# Patient Record
Sex: Female | Born: 1935 | Race: White | Hispanic: No | Smoking: Never smoker
Health system: Southern US, Community
[De-identification: ages and names within clinical notes are randomized; demographics above are authoritative.]

## PROBLEM LIST (undated history)

## (undated) DIAGNOSIS — F411 Generalized anxiety disorder: Secondary | ICD-10-CM

## (undated) DIAGNOSIS — I351 Nonrheumatic aortic (valve) insufficiency: Secondary | ICD-10-CM

## (undated) DIAGNOSIS — E871 Hypo-osmolality and hyponatremia: Secondary | ICD-10-CM

## (undated) DIAGNOSIS — E785 Hyperlipidemia, unspecified: Secondary | ICD-10-CM

## (undated) DIAGNOSIS — G459 Transient cerebral ischemic attack, unspecified: Secondary | ICD-10-CM

## (undated) DIAGNOSIS — D333 Benign neoplasm of cranial nerves: Secondary | ICD-10-CM

## (undated) DIAGNOSIS — R519 Headache, unspecified: Secondary | ICD-10-CM

## (undated) DIAGNOSIS — I6529 Occlusion and stenosis of unspecified carotid artery: Secondary | ICD-10-CM

## (undated) DIAGNOSIS — R0600 Dyspnea, unspecified: Secondary | ICD-10-CM

## (undated) DIAGNOSIS — I350 Nonrheumatic aortic (valve) stenosis: Secondary | ICD-10-CM

## (undated) DIAGNOSIS — I10 Essential (primary) hypertension: Secondary | ICD-10-CM

## (undated) DIAGNOSIS — R51 Headache: Secondary | ICD-10-CM

## (undated) HISTORY — DX: Occlusion and stenosis of unspecified carotid artery: I65.29

## (undated) HISTORY — DX: Benign neoplasm of cranial nerves: D33.3

## (undated) HISTORY — DX: Headache, unspecified: R51.9

## (undated) HISTORY — DX: Generalized anxiety disorder: F41.1

## (undated) HISTORY — DX: Hypo-osmolality and hyponatremia: E87.1

## (undated) HISTORY — PX: CHOLECYSTECTOMY: SHX55

## (undated) HISTORY — DX: Hyperlipidemia, unspecified: E78.5

## (undated) HISTORY — DX: Headache: R51

## (undated) HISTORY — DX: Transient cerebral ischemic attack, unspecified: G45.9

## (undated) HISTORY — PX: CATARACT EXTRACTION: SUR2

## (undated) HISTORY — DX: Nonrheumatic aortic (valve) stenosis: I35.0

## (undated) HISTORY — DX: Nonrheumatic aortic (valve) insufficiency: I35.1

---

## 1999-11-24 ENCOUNTER — Other Ambulatory Visit: Admission: RE | Admit: 1999-11-24 | Discharge: 1999-11-24 | Payer: Self-pay | Admitting: *Deleted

## 2004-09-30 ENCOUNTER — Ambulatory Visit: Payer: Self-pay | Admitting: Cardiology

## 2005-06-26 HISTORY — PX: WRIST SURGERY: SHX841

## 2006-09-18 ENCOUNTER — Other Ambulatory Visit: Admission: RE | Admit: 2006-09-18 | Discharge: 2006-09-18 | Payer: Self-pay | Admitting: Obstetrics and Gynecology

## 2008-01-20 ENCOUNTER — Encounter: Payer: Self-pay | Admitting: Cardiology

## 2008-05-01 ENCOUNTER — Ambulatory Visit: Payer: Self-pay | Admitting: Obstetrics and Gynecology

## 2008-05-06 ENCOUNTER — Ambulatory Visit: Payer: Self-pay | Admitting: Obstetrics and Gynecology

## 2008-05-14 ENCOUNTER — Encounter: Payer: Self-pay | Admitting: Cardiology

## 2008-06-12 ENCOUNTER — Encounter: Payer: Self-pay | Admitting: Cardiology

## 2008-06-25 ENCOUNTER — Ambulatory Visit: Payer: Self-pay | Admitting: Cardiology

## 2008-07-29 ENCOUNTER — Ambulatory Visit: Payer: Self-pay | Admitting: Cardiology

## 2008-08-20 ENCOUNTER — Ambulatory Visit: Payer: Self-pay | Admitting: Obstetrics and Gynecology

## 2009-01-14 DIAGNOSIS — I350 Nonrheumatic aortic (valve) stenosis: Secondary | ICD-10-CM

## 2009-01-14 DIAGNOSIS — R9389 Abnormal findings on diagnostic imaging of other specified body structures: Secondary | ICD-10-CM

## 2009-04-06 ENCOUNTER — Ambulatory Visit: Payer: Self-pay | Admitting: Obstetrics and Gynecology

## 2009-10-06 ENCOUNTER — Ambulatory Visit: Payer: Self-pay | Admitting: Obstetrics and Gynecology

## 2010-07-17 ENCOUNTER — Encounter: Payer: Self-pay | Admitting: Obstetrics and Gynecology

## 2010-11-08 NOTE — Assessment & Plan Note (Signed)
[facility]                          [facility] OFFICE NOTE   NAME:Gates, Kathryn RAVAL                      MRN:          [id number]  DATE:08/24/2008                            DOB:          31-May-1930    I saw Kathryn Gates in consultation back in early February.  She was referred  at that time given an abnormal echocardiogram demonstrating nodular  thickening of the left and noncoronary aortic cusps with restricted  motion of the left coronary cusp and moderate aortic regurgitation.  She  had had some visual changes earlier in December without definitive  diagnosis.  No clear mobile areas were noted in association with her  aortic valve and symptomatically she was stable on antiplatelet therapy.  We talked about the situation and discussed proceeding with a  transesophageal echocardiogram to better define her aortic valve  structure and function, and I also referred her for an erythrocyte  sedimentation rate and blood cultures to exclude the unlikely  possibility of endocarditis.  Her erythrocyte sedimentation rate was  entirely normal at 11 and her blood cultures were also normal.   Kathryn Gates was scheduled for her transesophageal echocardiogram on  August 18, 2008, and presented for this, although we unfortunately had  to reschedule the procedure given inpatient consult volume and staffing.  The office contacted Kathryn Gates about this and at this point, she prefers  to hold off rescheduling her transesophageal echocardiogram, and for  that matter office followup reporting that she was not having any new  symptoms.   At this point, Kathryn Gates should therefore continue to follow up with Dr.  Gerhard Munch and we can certainly proceed with further evaluation if Kathryn Gates  agrees.  Would at least consider followup surface echocardiogram in 1  year's time for reevaluation of the patient's aortic valve.     Jonelle Sidle, MD  Electronically Signed    SGM/MedQ  DD: 08/24/2008  DT: 08/25/2008  Job #: [id number]   cc:   Linward Foster

## 2010-11-08 NOTE — Assessment & Plan Note (Signed)
[facility]                          [facility]ICE NOTE   NAME:Kathryn Gates, Kathryn Gates                      MRN:          [id number]  DATE:07/29/2008                            DOB:          February 10, 1930    REFERRING PHYSICIAN:  Linward Foster   REASON FOR CONSULTATION:  Abnormal echocardiogram.   HISTORY OF PRESENT ILLNESS:  Kathryn Gates is a 77 year old woman with a  history of hyperlipidemia and gastroesophageal reflux disease.  She had  an episode of visual change back in December, prompting an  echocardiogram and carotid Dopplers as well as a head CT scan.  She  states that she was driving her car in her driveway and felt that her  vision suddenly frosted up.  This lasted for approximately 5 minutes  and was not associated with any other memory problems, speech problems,  or focal weakness.  She reports that she has had no recurrence of this.  Head CT scan did not reveal any obvious stroke.  Her carotid Dopplers  were also reassuring demonstrating no obstructive plaque.  Her  echocardiogram revealed normal left ventricular systolic function at 60-  65% with diastolic dysfunction, but no regional wall motion  abnormalities.  There was mild mitral regurgitation and trace tricuspid  regurgitation.  The aortic valve was abnormal with moderate nodular  thickening of the noncoronary cusps and moderate thickening of the left  coronary cusp with reduced leaflet excursion.  She did not have any  substantial degree of aortic stenosis with a mean gradient of 9 mmHg.  She did have moderate aortic regurgitation.  Ms Kathryn Gates denies having any  obvious fevers or chills.  She has had a general fatigue at times.  No  rashes.  Her electrocardiogram shows sinus rhythm with normal intervals  and nonspecific ST changes.  Today, I reviewed her echocardiographic  results and we talked about further testing.   ALLERGIES:  No known drug allergies.   MEDICATIONS:  1.  Pravastatin 20 mg p.o. daily.  2. Aciphex 20 mg p.o. daily.  3. Aspirin 81 mg p.o. daily.  4. Multivitamin 1 p.o. daily.  5. Xanax 0.25 mg p.o. p.r.n.   PAST MEDICAL HISTORY:  As outlined above.  She is status post cesarean  section in 1973.  She had right wrist surgery in 2007.  She reports  having some type of ovarian mass and will likely require an elective  surgery for this.  She states that this was not felt to be malignant.  She has no clear history of cardiovascular disease.   SOCIAL HISTORY:  The patient is a widow.  She has 1 child.  Her sister  was present with her today.  She drinks 3 cups of coffee a day.  Does  not do any regular exercise at this time.  She has no active tobacco or  alcohol use history.   FAMILY HISTORY:  Reviewed.  The patient's mother died at age 60 with  congestive heart failure.  The patient's father died at age 25 with a  stroke.  She has 1 sister died at age 42  due to complications of a fall.   REVIEW OF SYSTEMS:  As outlined above.  She does have occasional reflux,  although no dysphagia or odynophagia.  Otherwise is negative.   PHYSICAL EXAMINATION:  VITAL SIGNS:  Blood pressure 157/70, heart rate  is 75, and weight is 124 pounds.  GENERAL:  The patient is normally nourished appearing and in no acute  distress.  HEENT:  Conjunctiva is normal.  Oropharynx is clear.  NECK:  Supple.  No elevated jugular venous pressure.  No loud bruits.  No thyromegaly is noted.  LUNGS:  Clear with labored breathing at rest.  CARDIAC:  Regular rate and rhythm with a soft systolic murmur as well as  a soft diastolic murmur heard early after S2 best at the left sternal  border.  No pericardial rub or S3 gallop.  ABDOMEN:  Soft and nontender.  Normoactive bowel sounds.  EXTREMITIES:  No significant pitting edema.  Distal pulses are 2+.  SKIN:  Warm and dry.  No rashes.  No erythema.  MUSCULOSKELETAL:  No kyphosis noted.  NEUROPSYCHIATRIC:  The patient is alert  and oriented x3.  Affect is  appropriate.   IMPRESSION AND RECOMMENDATIONS:  Abnormal aortic valve based on recent  echocardiogram with nodular thickening particularly of the left and  noncoronary cusps, restricted motion of the left coronary cusps, and  moderate aortic regurgitation.  It is not clear that this was related to  her episode of visual change back in December.  No clear mobile areas in  association with aortic valve are noted on her surface study.  This may  well be a chronic degenerative process.  I agree with antiplatelet  therapy and would also recommend a followup transesophageal  echocardiogram to better assess aortic valvular structure and function,  excluding any thrombus or other sorce of emboli.  Although, I doubt  endocarditis is of major concern,  will obtain an erythrocyte  sedimentation rate and 2 sets of blood cultures to better resolve this  issue.  I will plan to have her come back to the office to review these  results and we can follow her aortic valve over time.     Jonelle Sidle, MD  Electronically Signed    SGM/MedQ  DD: 07/29/2008  DT: 07/29/2008  Job #: [id number]   cc:   Linward Foster

## 2010-12-22 ENCOUNTER — Encounter: Payer: Self-pay | Admitting: Cardiology

## 2011-08-24 ENCOUNTER — Encounter: Payer: Self-pay | Admitting: Vascular Surgery

## 2011-09-18 DIAGNOSIS — J069 Acute upper respiratory infection, unspecified: Secondary | ICD-10-CM | POA: Insufficient documentation

## 2011-09-18 DIAGNOSIS — J029 Acute pharyngitis, unspecified: Secondary | ICD-10-CM | POA: Insufficient documentation

## 2012-01-11 DIAGNOSIS — M23329 Other meniscus derangements, posterior horn of medial meniscus, unspecified knee: Secondary | ICD-10-CM | POA: Insufficient documentation

## 2012-04-04 DIAGNOSIS — R42 Dizziness and giddiness: Secondary | ICD-10-CM | POA: Insufficient documentation

## 2012-10-15 DIAGNOSIS — Z0000 Encounter for general adult medical examination without abnormal findings: Secondary | ICD-10-CM | POA: Insufficient documentation

## 2012-11-19 DIAGNOSIS — G4700 Insomnia, unspecified: Secondary | ICD-10-CM | POA: Insufficient documentation

## 2012-12-11 ENCOUNTER — Encounter (HOSPITAL_COMMUNITY): Payer: Self-pay | Admitting: *Deleted

## 2012-12-11 ENCOUNTER — Observation Stay (HOSPITAL_COMMUNITY)
Admission: EM | Admit: 2012-12-11 | Discharge: 2012-12-13 | Disposition: A | Discharge status: Home / Self Care | Payer: Medicare Other | Attending: Internal Medicine | Admitting: Internal Medicine

## 2012-12-11 DIAGNOSIS — D333 Benign neoplasm of cranial nerves: Secondary | ICD-10-CM

## 2012-12-11 DIAGNOSIS — R9389 Abnormal findings on diagnostic imaging of other specified body structures: Secondary | ICD-10-CM

## 2012-12-11 DIAGNOSIS — I10 Essential (primary) hypertension: Secondary | ICD-10-CM | POA: Diagnosis present

## 2012-12-11 DIAGNOSIS — E875 Hyperkalemia: Secondary | ICD-10-CM | POA: Diagnosis present

## 2012-12-11 DIAGNOSIS — G459 Transient cerebral ischemic attack, unspecified: Principal | ICD-10-CM | POA: Diagnosis present

## 2012-12-11 DIAGNOSIS — I359 Nonrheumatic aortic valve disorder, unspecified: Secondary | ICD-10-CM

## 2012-12-11 DIAGNOSIS — E871 Hypo-osmolality and hyponatremia: Secondary | ICD-10-CM | POA: Diagnosis present

## 2012-12-11 DIAGNOSIS — R209 Unspecified disturbances of skin sensation: Secondary | ICD-10-CM | POA: Insufficient documentation

## 2012-12-11 DIAGNOSIS — R4789 Other speech disturbances: Secondary | ICD-10-CM | POA: Insufficient documentation

## 2012-12-11 DIAGNOSIS — R42 Dizziness and giddiness: Secondary | ICD-10-CM | POA: Insufficient documentation

## 2012-12-11 DIAGNOSIS — Z79899 Other long term (current) drug therapy: Secondary | ICD-10-CM | POA: Insufficient documentation

## 2012-12-11 HISTORY — DX: Essential (primary) hypertension: I10

## 2012-12-11 LAB — URINALYSIS, ROUTINE W REFLEX MICROSCOPIC
Ketones, ur: NEGATIVE mg/dL
Nitrite: NEGATIVE
Specific Gravity, Urine: 1.012 (ref 1.005–1.030)
Urobilinogen, UA: 0.2 mg/dL (ref 0.0–1.0)
pH: 7 (ref 5.0–8.0)

## 2012-12-11 LAB — COMPREHENSIVE METABOLIC PANEL
Alkaline Phosphatase: 80 U/L (ref 39–117)
BUN: 14 mg/dL (ref 6–23)
CO2: 27 mEq/L (ref 19–32)
Calcium: 10.5 mg/dL (ref 8.4–10.5)
GFR calc Af Amer: 89 mL/min — ABNORMAL LOW (ref >90)
GFR calc non Af Amer: 77 mL/min — ABNORMAL LOW (ref >90)
Glucose, Bld: 131 mg/dL — ABNORMAL HIGH (ref 70–99)
Total Protein: 7.3 g/dL (ref 6.0–8.3)

## 2012-12-11 LAB — CBC
MCHC: 35.3 g/dL (ref 30.0–36.0)
MCV: 85 fL (ref 78.0–100.0)
RBC: 4.07 MIL/uL (ref 3.87–5.11)
WBC: 7.2 10*3/uL (ref 4.0–10.5)

## 2012-12-11 LAB — URINE MICROSCOPIC-ADD ON

## 2012-12-11 NOTE — ED Notes (Signed)
The pt has had tingling in her head for 3 weeks.  Since yesterday she has had weakness in her arms and legs.  3weeks ago she was admitted  And worked up for a stroke dx with mild tia.  She became concerned today and she came here because her appointment with neuro is not for one month

## 2012-12-11 NOTE — ED Notes (Signed)
Pt ambulatory to br

## 2012-12-12 ENCOUNTER — Observation Stay (HOSPITAL_COMMUNITY): Payer: Medicare Other

## 2012-12-12 ENCOUNTER — Encounter (HOSPITAL_COMMUNITY): Payer: Self-pay | Admitting: Internal Medicine

## 2012-12-12 DIAGNOSIS — I359 Nonrheumatic aortic valve disorder, unspecified: Secondary | ICD-10-CM

## 2012-12-12 DIAGNOSIS — I10 Essential (primary) hypertension: Secondary | ICD-10-CM | POA: Diagnosis present

## 2012-12-12 DIAGNOSIS — G459 Transient cerebral ischemic attack, unspecified: Secondary | ICD-10-CM | POA: Diagnosis present

## 2012-12-12 DIAGNOSIS — E875 Hyperkalemia: Secondary | ICD-10-CM | POA: Diagnosis present

## 2012-12-12 DIAGNOSIS — E871 Hypo-osmolality and hyponatremia: Secondary | ICD-10-CM | POA: Diagnosis present

## 2012-12-12 LAB — BASIC METABOLIC PANEL
Chloride: 95 mEq/L — ABNORMAL LOW (ref 96–112)
Creatinine, Ser: 0.71 mg/dL (ref 0.50–1.10)
GFR calc Af Amer: >90 mL/min (ref >90)
Potassium: 3.4 mEq/L — ABNORMAL LOW (ref 3.5–5.1)
Sodium: 128 mEq/L — ABNORMAL LOW (ref 135–145)

## 2012-12-12 LAB — OSMOLALITY, URINE: Osmolality, Ur: 265 mOsm/kg — ABNORMAL LOW (ref 390–1090)

## 2012-12-12 LAB — LIPID PANEL
Cholesterol: 230 mg/dL — ABNORMAL HIGH (ref 0–200)
HDL: 70 mg/dL (ref >39)
Total CHOL/HDL Ratio: 3.3 RATIO
Triglycerides: 65 mg/dL (ref <150)

## 2012-12-12 LAB — GLUCOSE, CAPILLARY
Glucose-Capillary: 96 mg/dL (ref 70–99)
Glucose-Capillary: 129 mg/dL — ABNORMAL HIGH (ref 70–99)

## 2012-12-12 LAB — CREATININE, URINE, RANDOM: Creatinine, Urine: 52.43 mg/dL

## 2012-12-12 MED ORDER — SIMVASTATIN 10 MG PO TABS
10.0000 mg | ORAL_TABLET | Freq: Every day | ORAL | Status: DC
  Administered 2012-12-12: 10 mg via ORAL
  Filled 2012-12-12 (×2): qty 1

## 2012-12-12 MED ORDER — ACETAMINOPHEN 325 MG PO TABS: 650.0000 mg | ORAL_TABLET | ORAL | Status: DC | PRN

## 2012-12-12 MED ORDER — ASPIRIN 325 MG PO TABS
325.0000 mg | ORAL_TABLET | Freq: Every day | ORAL | Status: DC
  Administered 2012-12-12 – 2012-12-13 (×2): 325 mg via ORAL
  Filled 2012-12-12 (×2): qty 1

## 2012-12-12 MED ORDER — PANTOPRAZOLE SODIUM 40 MG PO TBEC
40.0000 mg | DELAYED_RELEASE_TABLET | Freq: Every day | ORAL | Status: DC
  Administered 2012-12-12 – 2012-12-13 (×2): 40 mg via ORAL
  Filled 2012-12-12 (×2): qty 1

## 2012-12-12 MED ORDER — DEXTROSE 5 % IV SOLN
1.0000 g | INTRAVENOUS | Status: DC
  Administered 2012-12-12 – 2012-12-13 (×2): 1 g via INTRAVENOUS
  Filled 2012-12-12 (×2): qty 10

## 2012-12-12 MED ORDER — ALPRAZOLAM 0.25 MG PO TABS: 0.2500 mg | ORAL_TABLET | Freq: Two times a day (BID) | ORAL | Status: DC | PRN

## 2012-12-12 NOTE — ED Notes (Signed)
Pt ambulatory to br

## 2012-12-12 NOTE — ED Provider Notes (Signed)
History     CSN: [id number]  Arrival date & time 12/11/12  2049   First MD Initiated Contact with Patient 12/11/12 2300      Chief Complaint  Patient presents with  . tingling in her head    HPI Kathryn Gates is a 79 y.o. female recent tingling in her head for 3 weeks. She says it starts without rhyme or reason, associated with dizziness. This occurred earlier today will she was sitting down talking with her son on the phone, she became dizzy and had tingling in his skull cap distribution over her head. She says her scalp is "tingly." She also says that her arms and legs are unusually weak but cannot identify any portion of the arm is specifically week. She has no weakness when combing her head or reaching for overhead objects. She has no weakness in getting out of a chair. She says she has progressive weakness over the course of the day. The sensation of tingling is not one side of the head or the other.  3 weeks ago when she first had the symptoms she was seen at [facility], she was admitted for a TIA workup including an MRI and carotid ultrasounds. She saw her family doctor Tuesday and she is being referred to a neurologist. She had episodes of dizziness on Monday and Wednesday of this week, and her son feels like she had slurred speech today at 5:00 this evening.  Denies any chest pain, shortness of breath, abdominal pain, nausea, vomiting, diarrhea, disequilibrium, falling, syncope, rash, myalgias, arthralgias. Past Medical History  Diagnosis Date  . Aortic regurgitation     Moderate  . Abnormal echocardiogram   . Hypertension     Past Surgical History  Procedure Laterality Date  . Cesarean section  1973  . Wrist surgery  2007    Right    Family History  Problem Relation Age of Onset  . Stroke Other   . Heart failure Other     CHF    History  Substance Use Topics  . Smoking status: Unknown If Ever Smoked  . Smokeless tobacco: Not on file     Comment: Tobacco  use-no  . Alcohol Use: No    OB History   Grav Para Term Preterm Abortions TAB SAB Ect Mult Living                  Review of Systems At least 10pt or greater review of systems completed and are negative except where specified in the HPI.  Allergies  Review of patient's allergies indicates no known allergies.  Home Medications   Current Outpatient Rx  Name  Route  Sig  Dispense  Refill  . ALPRAZolam (XANAX) 0.25 MG tablet   Oral   Take 0.25 mg by mouth 2 (two) times daily as needed for anxiety.         Marland Kitchen aspirin EC 81 MG tablet   Oral   Take 81 mg by mouth daily.         . metoprolol succinate (TOPROL-XL) 25 MG 24 hr tablet   Oral   Take 25 mg by mouth daily.         . pravastatin (PRAVACHOL) 20 MG tablet   Oral   Take 20 mg by mouth at bedtime.         . RABEprazole (ACIPHEX) 20 MG tablet   Oral   Take 20 mg by mouth daily.         Marland Kitchen  valsartan (DIOVAN) 160 MG tablet   Oral   Take 160 mg by mouth daily.           BP 153/49  Pulse 53  Temp(Src) 97.3 F (36.3 C) (Oral)  Resp 16  SpO2 100%  Physical Exam  PHYSICAL EXAM: VITAL SIGNS:  . Filed Vitals:   12/11/12 2107 12/11/12 2224 12/11/12 2315  BP: 136/43 153/49 153/49  Pulse: 60 52 53  Temp: 97.6 F (36.4 C) 97.3 F (36.3 C)   TempSrc: Oral Oral   Resp: 16  16  SpO2: 98% 100% 100%   CONSTITUTIONAL: Awake, oriented, appears non-toxic HENT: Atraumatic, normocephalic, oral mucosa pink and moist, airway patent. Nares patent without drainage. External ears normal. EYES: Conjunctiva clear, EOMI, PERRLA NECK: Trachea midline, non-tender, supple CARDIOVASCULAR: Normal heart rate, Normal rhythm, No murmurs, rubs, gallops PULMONARY/CHEST: Clear to auscultation, no rhonchi, wheezes, or rales. Symmetrical breath sounds. CHEST WALL: No lesions. Non-tender. ABDOMINAL: Non-distended, soft, non-tender - no rebound or guarding.  BS normal. NEUROLOGIC: GU:YQIHKV fields intact. PERRLA, EOMI.  Facial  sensation equal to light touch bilaterally.  Good muscle bulk in the masseter muscle and good lateral movement of the jaw.  Facial expressions equal and good strength with smile/frown and puffed cheeks.  Hearing grossly intact to finger rub test.  Uvula, tongue are midline with no deviation. Symmetrical palate elevation.  Trapezius and SCM muscles are 5/5 strength bilaterally.   DTR: Brachioradialis, biceps, patellar, Achilles tendon reflexes 2+ bilaterally.  No clonus. Strength: 5/5 strength flexors and extensors in the upper and lower extremities.  Grip strength, finger adduction/abduction 5/5. Sensation: Sensation intact distally to light touch Cerebellar: No ataxia with walking or dysmetria with finger to nose EXTREMITIES: No clubbing, cyanosis, or edema SKIN: Warm, Dry, No erythema, No rash   ED Course  Procedures (including critical care time)  Date: 12/12/2012  Rate: 54  Rhythm: Sinus bradycardia  QRS Axis: normal  Intervals: normal  ST/T Wave abnormalities: normal  Conduction Disutrbances: none  Narrative Interpretation: Unremarkable, sinus bradycardia, asymptomatic, no ST or T wave abnormalities suggestive of acute ischemia or infarction  Labs Reviewed  CBC - Abnormal; Notable for the following:    HCT 34.6 (*)    All other components within normal limits  COMPREHENSIVE METABOLIC PANEL - Abnormal; Notable for the following:    Sodium 131 (*)    Potassium 5.5 (*)    Glucose, Bld 131 (*)    GFR calc non Af Amer 77 (*)    GFR calc Af Amer 89 (*)    All other components within normal limits  URINALYSIS, ROUTINE W REFLEX MICROSCOPIC - Abnormal; Notable for the following:    Leukocytes, UA MODERATE (*)    All other components within normal limits  GLUCOSE, CAPILLARY - Abnormal; Notable for the following:    Glucose-Capillary 126 (*)    All other components within normal limits  URINE MICROSCOPIC-ADD ON - Abnormal; Notable for the following:    Casts HYALINE CASTS (*)    All  other components within normal limits   No results found.   1. TIA (transient ischemic attack)   2. Hyperkalemia   3. Hypertension   4. Hyponatremia       MDM  Patient arrives to the symptoms of tingling scalp, possible dysarthria after having been worked up at [place], patient is currently taking a daily aspirin. When asked about Coumadin, her physicians thought she was not a good candidate for Coumadin therapy.  Have sent for records  from [facility].  Comparing records from [facility], patient is had chronic, mild hyponatremia, and has had ultrasounds of her carotids however has not had MRI of her brain. Discussed with Dr. Adela Glimpse for admission for TIA workup. Patient needs to be further risk stratified, for possible antiplatelet medications or anticoagulation.        Jones Skene, MD 12/12/12 [phone]

## 2012-12-12 NOTE — Progress Notes (Signed)
Patient admitted for TIA workup. Await completion of workup. Will continue to follow.  Peggye Pitt, MD [facility] Pager: [phone]

## 2012-12-12 NOTE — ED Notes (Signed)
Report given to floor

## 2012-12-12 NOTE — Progress Notes (Signed)
PT Cancellation Note  Patient Details Name: Kathryn Gates MRN: [id number] DOB: Aug 21, 1929   Cancelled Treatment:    Reason Eval/Treat Not Completed: Patient at procedure or test/unavailable. Will f/u tomorrow.    [facility] HELEN 12/12/2012, 3:02 PM Pager: [phone]

## 2012-12-12 NOTE — Progress Notes (Signed)
Echo Lab  2D Echocardiogram completed.  Kathryn Gates, RDCS 12/12/2012 3:57 PM

## 2012-12-12 NOTE — H&P (Signed)
PCP: [facility]: Alanda Amass  Chief Complaint:  slurred speech  HPI: Kathryn Gates is a 79 y.o. female   has a past medical history of Aortic regurgitation; Abnormal echocardiogram; and Hypertension.   Presented with  In AM she started to have tingling of her scalp and slurred speech that episode eased off during the day and went away by the time she presented to ER. She have had similar episodes in the recent past and 4 weeks ago was hospitalized at [facility] she have had a carotid doppler done that showed RICA 50% and LICA <50%. CXR was unremakable with negative CT of the head. No MRI was done at that time. Her Sodium was noted to be slightly low at 130. With K of 4.5 .  Currently her symptoms have resolved. Denis any chest pain or SOB, no fever, chills. Denies any localized weakness.  Hospitlatist called for admission.   Review of Systems:    Pertinent positives include: slurred speech, scalp tingling  Constitutional:  No weight loss, night sweats, Fevers, chills, fatigue, weight loss  HEENT:  No headaches, Difficulty swallowing,Tooth/dental problems,Sore throat,  No sneezing, itching, ear ache, nasal congestion, post nasal drip,  Cardio-vascular:  No chest pain, Orthopnea, PND, anasarca, dizziness, palpitations.no Bilateral lower extremity swelling  GI:  No heartburn, indigestion, abdominal pain, nausea, vomiting, diarrhea, change in bowel habits, loss of appetite, melena, blood in stool, hematemesis Resp:  no shortness of breath at rest. No dyspnea on exertion, No excess mucus, no productive cough, No non-productive cough, No coughing up of blood.No change in color of mucus.No wheezing. Skin:  no rash or lesions. No jaundice GU:  no dysuria, change in color of urine, no urgency or frequency. No straining to urinate.  No flank pain.  Musculoskeletal:  No joint pain or no joint swelling. No decreased range of motion. No back pain.  Psych:  No change in  mood or affect. No depression or anxiety. No memory loss.  Neuro: no localizing neurological complaints, no tingling, no weakness, no double vision, no gait abnormality, no  no confusion  Otherwise ROS are negative except for above, 10 systems were reviewed  Past Medical History: Past Medical History  Diagnosis Date  . Aortic regurgitation     Moderate  . Abnormal echocardiogram   . Hypertension    Past Surgical History  Procedure Laterality Date  . Cesarean section  1973  . Wrist surgery  2007    Right     Medications: Prior to Admission medications   Medication Sig Start Date End Date Taking? Authorizing Provider  ALPRAZolam (XANAX) 0.25 MG tablet Take 0.25 mg by mouth 2 (two) times daily as needed for anxiety.   Yes Historical Provider, MD  aspirin EC 81 MG tablet Take 81 mg by mouth daily.   Yes Historical Provider, MD  metoprolol succinate (TOPROL-XL) 25 MG 24 hr tablet Take 12.5 mg by mouth daily.    Yes Historical Provider, MD  pravastatin (PRAVACHOL) 20 MG tablet Take 20 mg by mouth at bedtime.   Yes Historical Provider, MD  RABEprazole (ACIPHEX) 20 MG tablet Take 20 mg by mouth daily.   Yes Historical Provider, MD  valsartan (DIOVAN) 160 MG tablet Take 160 mg by mouth daily.   Yes Historical Provider, MD    Allergies:  No Known Allergies  Social History:  Ambulatory with cane Lives at  Home with family   reports that she has never smoked. She does not have any smokeless tobacco  history on file. She reports that she does not drink alcohol or use illicit drugs.   Family History: family history includes Heart failure in her other and Stroke in her other.    Physical Exam: Patient Vitals for the past 24 hrs:  BP Temp Temp src Pulse Resp SpO2  12/12/12 0415 136/54 mmHg - - 65 19 99 %  12/12/12 0400 118/50 mmHg - - 53 13 98 %  12/12/12 0345 138/50 mmHg - - 58 17 99 %  12/12/12 0330 104/45 mmHg - - 48 12 98 %  12/12/12 0315 118/47 mmHg - - 49 12 99 %  12/12/12  0300 128/46 mmHg - - 49 17 98 %  12/12/12 0245 123/47 mmHg - - 53 17 98 %  12/12/12 0230 128/48 mmHg - - 54 19 99 %  12/12/12 0215 117/60 mmHg - - 55 15 99 %  12/12/12 0200 86/69 mmHg - - 53 16 99 %  12/12/12 0145 64/42 mmHg - - 51 15 100 %  12/12/12 0130 146/51 mmHg - - 63 22 100 %  12/12/12 0115 135/42 mmHg - - 46 10 98 %  12/12/12 0100 126/41 mmHg - - 45 10 98 %  12/12/12 0045 134/42 mmHg - - 50 14 97 %  12/12/12 0030 145/48 mmHg - - 46 11 98 %  12/11/12 2315 153/49 mmHg - - 53 16 100 %  12/11/12 2224 153/49 mmHg 97.3 F (36.3 C) Oral 52 - 100 %  12/11/12 2107 136/43 mmHg 97.6 F (36.4 C) Oral 60 16 98 %    1. General:  in No Acute distress 2. Psychological: Alert and Oriented 3. Head/ENT:   Moist  Mucous Membranes                          Head Non traumatic, neck supple                          NormalDentition 4. SKIN: normal Skin turgor,  Skin clean Dry and intact no rash 5. Heart: Regular rate and rhythm Aortic Murmur noted , no Rub or gallop 6. Lungs: Clear to auscultation bilaterally, no wheezes or crackles   7. Abdomen: Soft, non-tender, Non distended 8. Lower extremities: no clubbing, cyanosis, or edema 9. Neurologically CN 2-12 intact, strength 5/5 in all 4 ext. No pronator drift, normal speech 10. MSK: Normal range of motion  body mass index is unknown because there is no height or weight on file.   Labs on Admission:   Recent Labs  12/11/12 2100  NA 131*  K 5.5*  CL 96  CO2 27  GLUCOSE 131*  BUN 14  CREATININE 0.78  CALCIUM 10.5    Recent Labs  12/11/12 2100  AST 17  ALT 11  ALKPHOS 80  BILITOT 0.6  PROT 7.3  ALBUMIN 4.2   No results found for this basename: LIPASE, AMYLASE,  in the last 72 hours  Recent Labs  12/11/12 2100  WBC 7.2  HGB 12.2  HCT 34.6*  MCV 85.0  PLT 279   No results found for this basename: CKTOTAL, CKMB, CKMBINDEX, TROPONINI,  in the last 72 hours No results found for this basename: TSH, T4TOTAL, FREET3, T3FREE,  THYROIDAB,  in the last 72 hours No results found for this basename: VITAMINB12, FOLATE, FERRITIN, TIBC, IRON, RETICCTPCT,  in the last 72 hours No results found for this basename: HGBA1C  CrCl is unknown because there is no height on file for the current visit. ABG No results found for this basename: phart, pco2, po2, hco3, tco2, acidbasedef, o2sat     No results found for this basename: DDIMER     Other results:  I have pearsonaly reviewed this: ECG REPORT  Rate: 54  Rhythm: sinus bradycardia ST&T Change: no ischemic changes  UA mild UTI   Cultures: No results found for this basename: sdes, specrequest, cult, reptstatus       Radiological Exams on Admission: No results found.  Chart has been reviewed  Assessment/Plan  79 yo F with hxo f HTN and aortic regurgitation here with atypical TIA like  Symptoms, mild hyponatremia and hyperkalemia  Present on Admission:  . TIA (transient ischemic attack) -  - will admit based on TIA/CVA protocol, await results of MRA/MRI  and Echo, obtain cardiac enzymes,  ECG,   Lipid panel, TSH. Order PT/OT evaluation. Will make sure patient is on antiplatelet agent.   Neurology consult if abnormal MRI.     Marland Kitchen Hypertension - hold toprol given bradycardia and low BP, hold for now ARB given hyperkalemia . Hyperkalemia - hold diovan and repeat . Hyponatremia - mild, possibly chronic will check urine electrolytes she may benefit from fluid restriction   Prophylaxis: SCD   CODE STATUS: FULL CODE   Other plan as per orders.  I have spent a total of  55 min on this admission  Lovenia Debruler 12/12/2012, 4:48 AM

## 2012-12-13 DIAGNOSIS — D333 Benign neoplasm of cranial nerves: Secondary | ICD-10-CM

## 2012-12-13 LAB — LIPID PANEL
Cholesterol: 237 mg/dL — ABNORMAL HIGH (ref 0–200)
HDL: 73 mg/dL (ref >39)
LDL Cholesterol: 152 mg/dL — ABNORMAL HIGH (ref 0–99)
Triglycerides: 62 mg/dL (ref <150)

## 2012-12-13 LAB — GLUCOSE, CAPILLARY
Glucose-Capillary: 79 mg/dL (ref 70–99)
Glucose-Capillary: 80 mg/dL (ref 70–99)

## 2012-12-13 NOTE — Evaluation (Signed)
Physical Therapy Evaluation Patient Details Name: Kathryn Gates MRN: [id number] DOB: 07-Apr-1930 Today's Date: 12/13/2012 Time: 4098-1191 PT Time Calculation (min): 19 min  PT Assessment / Plan / Recommendation Clinical Impression  79 y/o female adm. for slurred speech and imbalance in the past few weeks. MRI (-) for acute infarct however suggestive of acoustic schwannoma. Per MD this may be what is causing her imbalance. She presents to PT today with impaired balance and confidence with mobility making her at a higher risk for falls. We trialed her on RW and [facility] and she is much safer and more confident with RW. Patient initially hesitant to using RW however with education and practice she seems open to it at least short term. Recommending HHPT for full balance assessment as well a RW for d/c.     PT Assessment  Patient needs continued PT services    Follow Up Recommendations  Home health PT;Supervision - Intermittent    Does the patient have the potential to tolerate intense rehabilitation      Barriers to Discharge        Equipment Recommendations  Rolling walker with 5" wheels    Recommendations for Other Services     Frequency Min 3X/week    Precautions / Restrictions Precautions Precautions: Fall Restrictions Weight Bearing Restrictions: No   Pertinent Vitals/Pain Denies pain, does report some anxiety following our session      Mobility  Bed Mobility Bed Mobility: Not assessed Transfers Sit to Stand: 4: Min guard;5: Supervision (HHA, PT to assess for AD) Stand to Sit: 5: Supervision Details for Transfer Assistance: holds onto arm rests on chair for support throughout transition, cues for hand placement Ambulation/Gait Ambulation/Gait Assistance: 4: Min assist;5: Supervision Ambulation Distance (Feet): 200 Feet Ambulation/Gait Assistance Details: ambulated 100 ft with SPC (in right hand), ambulates with high gaurd and slow cautious pace with decreased trunk  rotation, reaches out for support from therapist and needed hand held assist for any dynamic challenges (see balance section); with RW pt is ambulating at supervision level needing cues for safe positioning with RW, still with slower speed but able to negotiate dynamic challenges easier and with more confidence Gait Pattern: Step-through pattern;Decreased stride length;Decreased trunk rotation Gait velocity: decreased Stairs: Yes Stairs Assistance: 6: Modified independent (Device/Increase time) Stair Management Technique: Two rails;Alternating pattern Number of Stairs: 2         PT Diagnosis: Abnormality of gait;Generalized weakness  PT Problem List: Decreased balance;Decreased activity tolerance PT Treatment Interventions: DME instruction;Gait training;Functional mobility training;Therapeutic activities;Therapeutic exercise;Balance training;Patient/family education;Neuromuscular re-education   PT Goals Acute Rehab PT Goals PT Goal Formulation: With patient Time For Goal Achievement: 12/20/12 Potential to Achieve Goals: Good Pt will Ambulate: >150 feet;with modified independence;with gait velocity >(comment) ft/second (1.8 ft/sec) PT Goal: Ambulate - Progress: Goal set today  Visit Information  Last PT Received On: 12/13/12 Assistance Needed: +1    Subjective Data  Subjective: Im just not ready for a walker yet.  Patient Stated Goal: home, balance   Prior Functioning  Home Living Lives With: Son Available Help at Discharge: Family;Available PRN/intermittently (Son works during the day) Type of Home: House Home Access: Stairs to enter [employer] of Steps: 4 STE Entrance Stairs-Rails: Right;Left Home Layout: One level Bathroom Shower/Tub: Walk-in shower;Door Bathroom Toilet: Handicapped height Home Adaptive Equipment: Straight cane Prior Function Level of Independence: Independent Able to Take Stairs?: Yes Driving: Yes Vocation:  Retired Musician: No difficulties Dominant Hand: Right    Cognition  Cognition Arousal/Alertness:  Awake/alert Behavior During Therapy: WFL for tasks assessed/performed Overall Cognitive Status: Within Functional Limits for tasks assessed    Extremity/Trunk Assessment Right Upper Extremity Assessment RUE ROM/Strength/Tone: [facility] for tasks assessed;Within functional levels RUE Sensation: WFL - Light Touch RUE Coordination: WFL - gross/fine motor Left Upper Extremity Assessment LUE ROM/Strength/Tone: WFL for tasks assessed;Within functional levels LUE Sensation: WFL - Light Touch LUE Coordination: WFL - gross/fine motor Right Lower Extremity Assessment RLE ROM/Strength/Tone: WFL for tasks assessed Left Lower Extremity Assessment LLE ROM/Strength/Tone: WFL for tasks assessed Trunk Assessment Trunk Assessment: Normal   Balance Balance Balance Assessed: Yes Static Sitting Balance Static Sitting - Balance Support: No upper extremity supported;Feet supported Dynamic Sitting Balance Dynamic Sitting - Balance Support: No upper extremity supported;Feet supported;Feet unsupported Static Standing Balance Static Standing - Balance Support: Left upper extremity supported Static Standing - Level of Assistance: 5: Stand by assistance Static Standing - Comment/# of Minutes: attempted static standing without upper extremity support however pt fearful and reaching for external support High Level Balance High Level Balance Activites: Direction changes;Turns High Level Balance Comments: did portions of DGI with SPC and RW (speed changes and head turns); pt needing HHA for stability when using SPC, appears very cautious with decreased trunk rotation and slower speed, unable to maintain head turns longer than 1-2 seconds without returning to forward gaze; with RW pt more confident, able to hold head turns for longer, mingaurdA initially but progressed to supervision level quickly  End  of Session PT - End of Session Equipment Utilized During Treatment: Gait belt Activity Tolerance: Patient tolerated treatment well Patient left: in chair;with call bell/phone within reach Nurse Communication: Mobility status  GP Functional Limitation: Mobility: Walking and moving around Mobility: Walking and Moving Around Current Status (W0981): At least 1 percent but less than 20 percent impaired, limited or restricted Mobility: Walking and Moving Around Goal Status [phone]): 0 percent impaired, limited or restricted   [facility] HELEN 12/13/2012, 8:57 AM

## 2012-12-13 NOTE — Progress Notes (Signed)
Pt alert and oriented x4, IV removed, discharge and stroke education completed. Pt given stroke education booklet. Pt discharged home via wheelchair with family and belongings.

## 2012-12-13 NOTE — Evaluation (Signed)
Occupational Therapy Evaluation Patient Details Name: Kathryn Gates MRN: [id number] DOB: 04-Jan-1930 Today's Date: 12/13/2012 Time: 5784-6962 OT Time Calculation (min): 41 min  OT Assessment / Plan / Recommendation Clinical Impression  Pt is an 79 y/o female dx TIA. She reports that her symptoms have resolved, is Min guard HHA for transfers, but anticipate increased I w/ AD as recommended by PT when assessed later today. MRI negative CVA. Pt performed full ADL UB/LB w/ Mod I-supervision level. Pt reports that she is at baseline & has no further OT needs at this time.    OT Assessment  Patient does not need any further OT services    Follow Up Recommendations  No OT follow up    Barriers to Discharge      Equipment Recommendations  None recommended by OT;Other (comment) (Discussed grab bars in shower w/ pt, reports son to add)    Recommendations for Other Services    Frequency       Precautions / Restrictions Restrictions Weight Bearing Restrictions: No   Pertinent Vitals/Pain No pain, No /co.    ADL  Eating/Feeding: Performed;Independent Where Assessed - Eating/Feeding: Chair Grooming: Performed;Wash/dry hands;Wash/dry face;Teeth care;Denture care;Brushing hair;Set up Where Assessed - Grooming: Unsupported sitting Upper Body Bathing: Performed;Chest;Right arm;Left arm;Abdomen;Independent;Set up Where Assessed - Upper Body Bathing: Unsupported sitting Lower Body Bathing: Performed;Modified independent Where Assessed - Lower Body Bathing: Unsupported sitting;Supported sit to stand Upper Body Dressing: Performed;Set up Where Assessed - Upper Body Dressing: Unsupported sitting Lower Body Dressing: Performed;Modified independent Where Assessed - Lower Body Dressing: Supported sit to Pharmacist, hospital: Performed;Min Agricultural engineer Method: Sit to Barista: Comfort height toilet;Grab bars Toileting - Architect and  Hygiene: Performed;Supervision/safety Where Assessed - Engineer, mining and Hygiene: Standing Tub/Shower Transfer Method: Not assessed Equipment Used: Gait belt;Other (comment) (HHA) Transfers/Ambulation Related to ADLs: Pt is a little unsteady on her feet at times and reports that she has recently begun using SPC at home. She was Min guard/HHA into bathroom, awaiting PT assessment for AD. Anticipate increased Independence w/ AD PRN. ADL Comments: Pt is an 79 y/o female dx TIA. She reports that her symptoms have resolved, is Min guard HHA for transfers, but anticipate increased I w/ AD as recommended by PT when assessed later today. MRI is negative. Pt performed full ADL UB/LB w/ Mod I-supervision level. Pt reports that she is at baseline & has no further OT needs at this time.    OT Diagnosis:    OT Problem List:   OT Treatment Interventions:     OT Goals    Visit Information  Last OT Received On: 12/13/12 Assistance Needed: +1    Subjective Data  Subjective: "I'm a little nervous what the Dr is going to say" Patient Stated Goal: Home when able    Prior Functioning     Home Living Lives With: Son Available Help at Discharge: Family;Available PRN/intermittently (Son works during the day) Type of Home: House Home Access: Stairs to enter [employer] of Steps: 4 STE Entrance Stairs-Rails: Right;Left Home Layout: One level Bathroom Shower/Tub: Walk-in shower;Door [employer] Toilet: Handicapped height Home Adaptive Equipment: Straight cane Prior Function Level of Independence: Independent Able to Take Stairs?: Yes Driving: Yes Vocation: Retired Musician: No difficulties Dominant Hand: Right    Vision/Perception Vision - History Baseline Vision: Wears glasses all the time Visual History: Cataracts (Left eye) Patient Visual Report: No change from baseline   Cognition  Cognition Arousal/Alertness: Awake/alert Behavior During  Therapy: WFL for tasks assessed/performed Overall Cognitive Status: Within Functional Limits for tasks assessed    Extremity/Trunk Assessment Right Upper Extremity Assessment RUE ROM/Strength/Tone: [facility] for tasks assessed;Within functional levels RUE Sensation: WFL - Light Touch RUE Coordination: WFL - gross/fine motor Left Upper Extremity Assessment LUE ROM/Strength/Tone: WFL for tasks assessed;Within functional levels LUE Sensation: WFL - Light Touch LUE Coordination: WFL - gross/fine motor     Mobility Bed Mobility Bed Mobility: Not assessed (Pt up in chair) Transfers Transfers: Sit to Stand;Stand to Sit Sit to Stand: 4: Min guard;5: Supervision (HHA, PT to assess for AD) Stand to Sit: 5: Supervision;4: Min guard (HHA awaiting PT assessment)        Balance Balance Balance Assessed: Yes Static Sitting Balance Static Sitting - Balance Support: No upper extremity supported;Feet supported Dynamic Sitting Balance Dynamic Sitting - Balance Support: No upper extremity supported;Feet supported;Feet unsupported Static Standing Balance Static Standing - Balance Support: Left upper extremity supported;During functional activity   End of Session OT - End of Session Equipment Utilized During Treatment: Gait belt;Other (comment) (HHA, PT to assess for AD) Activity Tolerance: Patient tolerated treatment well Patient left: in chair;with call bell/phone within reach;Other (comment) (MD in room) Nurse Communication: Mobility status;Other (comment) (ADL's complete)  GO Functional Assessment Tool Used: Clinical Judgement Functional Limitation: Self care Self Care Current Status (Z6109): At least 1 percent but less than 20 percent impaired, limited or restricted Self Care Goal Status (U0454): At least 1 percent but less than 20 percent impaired, limited or restricted Self Care Discharge Status [phone]): At least 1 percent but less than 20 percent impaired, limited or restricted   Alm Bustard 12/13/2012, 8:35 AM

## 2012-12-13 NOTE — Discharge Summary (Signed)
Physician Discharge Summary  Kathryn Gates ZOX:[id number] DOB: [birth year] DOA: 12/11/2012  PCP: No primary provider on file.  Admit date: 12/11/2012 Discharge date: 12/13/2012  Time spent: Greater than 30 minutes  Recommendations for Outpatient Follow-up:  -Advise to follow up with PCP in 2 weeks. -Will need follow up with Dr. [name], ENT, in about 1 month for her acoustic neuroma.   Discharge Diagnoses:  Active Problems:   TIA (transient ischemic attack)   Hypertension   Hyperkalemia   Hyponatremia   Acoustic neuroma   Discharge Condition: Stable  Filed Weights   12/12/12 1935  Weight: 54.432 kg (120 lb)    History of present illness:  Patient is an 79 y/o woman who presented with: In AM she started to have tingling of her scalp and slurred speech that episode eased off during the day and went away by the time she presented to ER. She have had similar episodes in the recent past and 4 weeks ago was hospitalized at [facility] she have had a carotid doppler done that showed RICA 50% and LICA <50%. CXR was unremakable with negative CT of the head. No MRI was done at that time. Her Sodium was noted to be slightly low at 130. With K of 4.5 .  Currently her symptoms have resolved. Kathryn Gates any chest pain or SOB, no fever, chills. Denies any localized weakness. We were asked to admit her for further evaluation and management.   Hospital Course:  TIA -Symptoms completely resolved (unsure if these symptoms can be completely attributed to her acoustic neuroma). -ECHO with only mild diastolic dysfunction. -Carotid dopplers performed recently at [facility] with report of: RICA 50% and LICA <50%. -LDL 152, started on simvastatin. -PT/OT recs: HH PT.  Acoustic Neuroma -Discovered incidentally on MRI. -Discussed via telephone with ENT, Dr. [name], who would be happy to follow her in the OP setting. No acute intervention required.  Mild Hyponatremia -128-131. -Suspect related to  decreased PO intake (which she admits to) and mild dehydration as she dis appear slightly hypovolemic on initial exam. -Can be followed in the OP setting.  Hyperkalemia, mild -Resolved. -K 3.4 on DC.  Procedures:  Echo   Consultations:  None  Discharge Instructions  Discharge Orders   Future Orders Complete By Expires     Diet - low sodium heart healthy  As directed     Discontinue IV  As directed     Increase activity slowly  As directed         Medication List    TAKE these medications       ALPRAZolam 0.25 MG tablet  Commonly known as:  XANAX  Take 0.25 mg by mouth 2 (two) times daily as needed for anxiety.     aspirin EC 81 MG tablet  Take 81 mg by mouth daily.     metoprolol succinate 25 MG 24 hr tablet  Commonly known as:  TOPROL-XL  Take 12.5 mg by mouth daily.     pravastatin 20 MG tablet  Commonly known as:  PRAVACHOL  Take 20 mg by mouth at bedtime.     RABEprazole 20 MG tablet  Commonly known as:  ACIPHEX  Take 20 mg by mouth daily.     valsartan 160 MG tablet  Commonly known as:  DIOVAN  Take 160 mg by mouth daily.       No Known Allergies     Follow-up Information   Schedule an appointment as soon as possible for a visit  in 2 weeks to follow up. (with your regular provider)       Follow up with BATES, DWIGHT, MD. Schedule an appointment as soon as possible for a visit in 1 month.   Contact information:   [address] [place] [address] [phone]        The results of significant diagnostics from this hospitalization (including imaging, microbiology, ancillary and laboratory) are listed below for reference.    Significant Diagnostic Studies: Ct Head Wo Contrast  12/12/2012   *RADIOLOGY REPORT*  Clinical Data: Dizziness.  CT HEAD WITHOUT CONTRAST  Technique:  Contiguous axial images were obtained from the base of the skull through the vertex without contrast.  Comparison: Head CT 11/05/2012.  Findings: Mild cerebral  and cerebellar atrophy. No acute intracranial abnormalities.  Specifically, no evidence of acute intracranial hemorrhage, no definite findings of acute/subacute cerebral ischemia, no mass, mass effect, hydrocephalus or abnormal intra or extra-axial fluid collections.  Visualized paranasal sinuses and mastoids are well pneumatized.  No acute displaced skull fractures are identified.  IMPRESSION: 1.  No acute intracranial abnormalities. 2.  Mild cerebral and cerebellar atrophy.   Original Report Authenticated By: Trudie Reed, M.D.   Mri Brain Without Contrast  12/12/2012   *RADIOLOGY REPORT*  Clinical Data:  Dizziness.  MRI BRAIN WITHOUT CONTRAST MRA HEAD WITHOUT CONTRAST  Technique: Multiplanar, multiecho pulse sequences of the brain and surrounding structures were obtained according to standard protocol without intravenous contrast.  Angiographic images of the head were obtained using MRA technique without contrast.  Comparison: 12/12/2012 head CT.  01/17/2012 brain MR.  MRI HEAD  Findings:  No acute infarct.  Mild small vessel disease type changes.  No hydrocephalus.  No intracranial hemorrhage.  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  Cerebellar tonsils minimally low-lying but within the range of normal limits.  Degenerative changes C1-2.  Pineal region, pituitary region and orbital structures unremarkable.  IMPRESSION: No acute infarct.  Mild small vessel disease type changes.  No intracranial hemorrhage.  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  MRA HEAD  Findings: Anterior circulation without medium or large size vessel significant stenosis or occlusion.  Fetal type origin of the posterior cerebral arteries.  Mild irregularity and narrowing involving portions of the M1 segment of the left middle cerebral artery and A1 segment of the left anterior cerebral artery.  Mild middle cerebral artery branch vessel  irregularity and narrowing bilaterally.  Left vertebral artery is small in size ending in a PICA distribution.  Mild irregularity and narrowing of the small left vertebral artery and left PICA.  Minimal narrowing distal right vertebral artery.  Mild narrowing and irregularity right PICA.  Small basilar artery with mild narrowing and irregularity.  Small caliber basilar artery partially explained by fetal type origin of the posterior cerebral arteries.  Moderate narrowing and irregularity of the superior cerebellar artery bilaterally and the distal branches of the posterior cerebral artery (more notable on the right).  No aneurysm or vascular malformation  IMPRESSION: Intracranial atherosclerotic type changes more notable involve the posterior circulation as detailed above.   Original Report Authenticated By: Lacy Duverney, M.D.   Mr Mra Head/brain Wo Cm  12/12/2012   *RADIOLOGY REPORT*  Clinical Data:  Dizziness.  MRI BRAIN WITHOUT CONTRAST MRA HEAD WITHOUT CONTRAST  Technique: Multiplanar, multiecho pulse sequences of the brain and surrounding structures were obtained according to standard protocol without  intravenous contrast.  Angiographic images of the head were obtained using MRA technique without contrast.  Comparison: 12/12/2012 head CT.  01/17/2012 brain MR.  MRI HEAD  Findings:  No acute infarct.  Mild small vessel disease type changes.  No hydrocephalus.  No intracranial hemorrhage.  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  Cerebellar tonsils minimally low-lying but within the range of normal limits.  Degenerative changes C1-2.  Pineal region, pituitary region and orbital structures unremarkable.  IMPRESSION: No acute infarct.  Mild small vessel disease type changes.  No intracranial hemorrhage.  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  MRA HEAD  Findings: Anterior circulation without medium or large size vessel  significant stenosis or occlusion.  Fetal type origin of the posterior cerebral arteries.  Mild irregularity and narrowing involving portions of the M1 segment of the left middle cerebral artery and A1 segment of the left anterior cerebral artery.  Mild middle cerebral artery branch vessel irregularity and narrowing bilaterally.  Left vertebral artery is small in size ending in a PICA distribution.  Mild irregularity and narrowing of the small left vertebral artery and left PICA.  Minimal narrowing distal right vertebral artery.  Mild narrowing and irregularity right PICA.  Small basilar artery with mild narrowing and irregularity.  Small caliber basilar artery partially explained by fetal type origin of the posterior cerebral arteries.  Moderate narrowing and irregularity of the superior cerebellar artery bilaterally and the distal branches of the posterior cerebral artery (more notable on the right).  No aneurysm or vascular malformation  IMPRESSION: Intracranial atherosclerotic type changes more notable involve the posterior circulation as detailed above.   Original Report Authenticated By: Lacy Duverney, M.D.    Microbiology: No results found for this or any previous visit (from the past 240 hour(s)).   Labs: Basic Metabolic Panel:  Recent Labs Lab 12/11/12 2100 12/12/12 0840  NA 131* 128*  K 5.5* 3.4*  CL 96 95*  CO2 27 23  GLUCOSE 131* 94  BUN 14 11  CREATININE 0.78 0.71  CALCIUM 10.5 9.4   Liver Function Tests:  Recent Labs Lab 12/11/12 2100  AST 17  ALT 11  ALKPHOS 80  BILITOT 0.6  PROT 7.3  ALBUMIN 4.2   No results found for this basename: LIPASE, AMYLASE,  in the last 168 hours No results found for this basename: AMMONIA,  in the last 168 hours CBC:  Recent Labs Lab 12/11/12 2100  WBC 7.2  HGB 12.2  HCT 34.6*  MCV 85.0  PLT 279   Cardiac Enzymes: No results found for this basename: CKTOTAL, CKMB, CKMBINDEX, TROPONINI,  in the last 168 hours BNP: BNP (last 3  results) No results found for this basename: PROBNP,  in the last 8760 hours CBG:  Recent Labs Lab 12/11/12 2103 12/12/12 0836 12/12/12 1126 12/12/12 1644 12/12/12 2152  GLUCAP 126* 96 129* 77 85       Signed:  HERNANDEZ ACOSTA,ESTELA  [facility] Pager: [phone] 12/13/2012, 9:56 AM

## 2012-12-13 NOTE — Care Management Note (Signed)
    Page 1 of 2   12/13/2012     1:41:44 PM   CARE MANAGEMENT NOTE 12/13/2012  Patient:  Kathryn Gates, Kathryn Gates   Account Number:  [id number]  Date Initiated:  12/13/2012  Documentation initiated by:  Elmer Bales  Subjective/Objective Assessment:   Pt admitted with stroke.     Action/Plan:   Will follow for discharge needs pending PT/OT evals   Anticipated DC Date:  12/13/2012   Anticipated DC Plan:  HOME W HOME HEALTH SERVICES      DC Planning Services  CM consult      Choice offered to / List presented to:  C-1 Patient   DME arranged  Levan Hurst      DME agency  [facility]     HH arranged  HH-2 PT      [facility] agency  [facility]   Status of service:  Completed, signed off Medicare Important Message given?   (If response is "NO", the following Medicare IM given date fields will be blank) Date Medicare IM given:   Date Additional Medicare IM given:    Discharge Disposition:  HOME W HOME HEALTH SERVICES  Per UR Regulation:  Reviewed for med. necessity/level of care/duration of stay  If discussed at Long Length of Stay Meetings, dates discussed:    Comments:  12/13/12 1230 Elmer Bales RN, MSN CM-  Met with patient to discuss discharge needs.  Pt chose to use [facility] for home PT.  Marie with [facility] was notified and accepted the referral.  Rolling walker was ordered for patient and [facility] DME was notified of discharge today.

## 2013-01-02 ENCOUNTER — Other Ambulatory Visit: Payer: Self-pay | Admitting: Cardiovascular Disease

## 2013-01-02 LAB — SEDIMENTATION RATE: Sed Rate: 5 mm/hr (ref 0–22)

## 2013-01-04 ENCOUNTER — Encounter: Payer: Self-pay | Admitting: Cardiovascular Disease

## 2013-03-25 DIAGNOSIS — R030 Elevated blood-pressure reading, without diagnosis of hypertension: Secondary | ICD-10-CM | POA: Insufficient documentation

## 2013-03-25 DIAGNOSIS — IMO0002 Reserved for concepts with insufficient information to code with codable children: Secondary | ICD-10-CM | POA: Insufficient documentation

## 2013-04-14 ENCOUNTER — Ambulatory Visit (INDEPENDENT_AMBULATORY_CARE_PROVIDER_SITE_OTHER): Payer: Medicare Other | Admitting: Neurology

## 2013-04-14 ENCOUNTER — Encounter: Payer: Self-pay | Admitting: Neurology

## 2013-04-14 ENCOUNTER — Encounter (INDEPENDENT_AMBULATORY_CARE_PROVIDER_SITE_OTHER): Payer: Self-pay

## 2013-04-14 VITALS — BP 191/73 | HR 80 | Temp 97.6°F | Ht 61.0 in | Wt 118.0 lb

## 2013-04-14 DIAGNOSIS — R4781 Slurred speech: Secondary | ICD-10-CM

## 2013-04-14 DIAGNOSIS — R4789 Other speech disturbances: Secondary | ICD-10-CM

## 2013-04-14 DIAGNOSIS — R202 Paresthesia of skin: Secondary | ICD-10-CM

## 2013-04-14 DIAGNOSIS — R209 Unspecified disturbances of skin sensation: Secondary | ICD-10-CM

## 2013-04-14 DIAGNOSIS — G44309 Post-traumatic headache, unspecified, not intractable: Secondary | ICD-10-CM

## 2013-04-14 DIAGNOSIS — R569 Unspecified convulsions: Secondary | ICD-10-CM

## 2013-04-14 MED ORDER — GABAPENTIN 100 MG PO CAPS: 100.0000 mg | ORAL_CAPSULE | Freq: Every day | ORAL | Status: DC

## 2013-04-14 NOTE — Progress Notes (Signed)
Subjective:    Patient ID: Kathryn Gates is a 79 y.o. female.  HPI  Kathryn Foley, MD, PhD [facility] [address] [address] [place], [place] [address]  Dear Dr. Leandrew Koyanagi,   I saw your patient, Kathryn Gates, upon your kind request in my neurologic clinic today for initial consultation of her headaches, which started in June 2014. The patient is accompanied by her sister and adoptive daughter today. As you know, Kathryn Gates is a very pleasant 79 year old right-handed woman with a complex medical history of anxiety, reflux disease, hypertension, hyponatremia, hyperlipidemia, osteoarthritis, and prior diagnosis of TIA, who presented to the emergency room on 03/16/2013 with generalized weakness and was found to have low blood pressure values, low-sodium, low potassium, and was diagnosed with a urinary tract infection. She presented with a diffuse headache. She has previously had workup for suspected TIA including head CT which was reported as within normal limits, carotid Doppler studies recently which showed less than 50% stenoses, MRI of the brain in June which was reportedly normal, echocardiogram which was reportedly normal. She has had paresthesias. I reviewed her brain MRI report from 01/17/2012 which showed no acute or focal abnormality to explain the patient's symptoms. Symptoms at the time were memory loss and abnormal gait. There were scattered subcortical T2 hyperintensities likely within normal limits for age. I reviewed blood work from your office from 03/25/2013 as well as 03/31/2013 which showed normal CMPs. She was diagnosed with TIA in June 2014, when she presented to the ER with HA/tingling in her head, dry mouth, low blood pressure. Prior to that she presented to [facility] with similar Sx, where she had had a carotid doppler done that showed RICA 50% and LICA <50%. CXR was unremakable and negative CT of the head. No MRI was done at that time. Her  Sodium was noted to be slightly low at 130, with K of 4.5 . At [facility], she stayed from 12/11/12-12/13/12 and had an Echo showed only mild diastolic dysfunction. LDL 152, started on simvastatin. She declined HH PT. She had an MRI brain and MRA head on 12/12/12: No acute infarct. Mild small vessel disease type changes. No intracranial hemorrhage. 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 Intracranial atherosclerotic type changes more notable involve the posterior circulation as detailed above. She was treated for a UTI in 6/14 and 9/14.  She has since then had intermittent HAs, which are not as severe and described as a tingling sensation on the top of her head associated with slurring of speech, which she attributes to severe mouth dryness and she has some tingling in her L face. She denies any focal weakness, but c/o sleepiness and fatigue and severe mouth dryness. She is responsive throughout of this, no B/B incontinence, no twitching, no convulsions. She has nearly daily HAs, which last for hours. She has taken Advil, but it does not seem to help.  She has no prior Hx of HAs. She feels at baseline at this moment. She endorses stressors and lately since January of this year, stress has increased. She lives in her own home and her son has been living with her, but in January, he moved his girlfriend with him. This has caused friction. She wonders, if her Sx are stress-related.   Her Past Medical History Is Significant For: Past Medical History  Diagnosis Date  . Aortic regurgitation     Moderate  . Abnormal echocardiogram   .  Hypertension     Her Past Surgical History Is Significant For: Past Surgical History  Procedure Laterality Date  . Cesarean section  1973  . Wrist surgery  2007    Right    Her Family History Is Significant For: Family History  Problem Relation Age of Onset  . Stroke Other   . Heart failure Other     CHF    Her Social History Is  Significant For: History   Social History  . Marital Status: Widowed    Spouse Name: N/A    Number of Children: N/A  . Years of Education: N/A   Social History Main Topics  . Smoking status: Never Smoker   . Smokeless tobacco: None     Comment: Tobacco use-no  . Alcohol Use: No  . Drug Use: No  . Sexual Activity: None   Other Topics Concern  . None   Social History Narrative   No regular exercise.    Her Allergies Are:  No Known Allergies:   Her Current Medications Are:  Outpatient Encounter Prescriptions as of 04/14/2013  Medication Sig Dispense Refill  . ALPRAZolam (XANAX) 0.25 MG tablet Take 0.25 mg by mouth 2 (two) times daily as needed for anxiety.      Marland Kitchen aspirin EC 81 MG tablet Take 81 mg by mouth daily.      . clopidogrel (PLAVIX) 75 MG tablet Take 1 tablet by mouth daily.      . metoprolol succinate (TOPROL-XL) 25 MG 24 hr tablet Take 12.5 mg by mouth daily.       . pravastatin (PRAVACHOL) 20 MG tablet Take 20 mg by mouth at bedtime.      . RABEprazole (ACIPHEX) 20 MG tablet Take 20 mg by mouth daily.      . valsartan (DIOVAN) 160 MG tablet Take 160 mg by mouth daily.       No facility-administered encounter medications on file as of 04/14/2013.  :  Review of Systems:  Out of a complete 14 point review of systems, all are reviewed and negative with the exception of these symptoms as listed below:  Review of Systems  Constitutional: Positive for chills, fatigue and unexpected weight change (loss).  HENT: Positive for trouble swallowing.   Eyes: Positive for visual disturbance (blurred vision).  Respiratory: Positive for cough.   Endocrine: Positive for polydipsia.  Neurological: Positive for speech difficulty, weakness and headaches.  Hematological: Bruises/bleeds easily.  Psychiatric/Behavioral: Positive for sleep disturbance. The patient is nervous/anxious.     Objective:  Neurologic Exam  Physical Exam Physical Examination:   Filed Vitals:    04/14/13 0954  BP: 191/73  Pulse: 80  Temp:     General Examination: The patient is a very pleasant 79 y.o. female in no acute distress. She appears well-developed and well-nourished and well groomed.   HEENT: Normocephalic, atraumatic, pupils are equal, round and reactive to light and accommodation. Funduscopic exam is normal with sharp disc margins noted. Extraocular tracking is good without limitation to gaze excursion or nystagmus noted. Normal smooth pursuit is noted. Hearing is grossly intact. Tympanic membranes are clear bilaterally. Face is symmetric with normal facial animation and normal facial sensation. Speech is clear with no dysarthria noted. There is no hypophonia. There is no lip, neck/head, jaw or voice tremor. Neck is supple with full range of passive and active motion. There are no carotid bruits on auscultation. Oropharynx exam reveals: mild mouth dryness, adequate dental hygiene and mild airway crowding, due to floppy  airway. Mallampati is class II. Tongue protrudes centrally and palate elevates symmetrically.   Chest: Clear to auscultation without wheezing, rhonchi or crackles noted.  Heart: S1+S2+0, regular; she had a murmur, but no rubs or gallops are noted.   Abdomen: Soft, non-tender and non-distended with normal bowel sounds appreciated on auscultation.  Extremities: There is no pitting edema in the distal lower extremities bilaterally. Pedal pulses are intact.  Skin: Warm and dry without trophic changes noted. There are no varicose veins.  Musculoskeletal: exam reveals no obvious joint deformities, tenderness or joint swelling or erythema.   Neurologically:  Mental status: The patient is awake, alert and oriented in all 4 spheres. His memory, attention, language and knowledge are appropriate. There is no aphasia, agnosia, apraxia or anomia. Speech is clear with normal prosody and enunciation. Thought process is linear. Mood is congruent and affect is normal.   Cranial nerves are as described above under HEENT exam. In addition, shoulder shrug is normal with equal shoulder height noted. Motor exam: Normal bulk, strength and tone is noted. There is no drift, tremor or rebound. Romberg is negative. Reflexes are 2+ throughout. Toes are downgoing bilaterally. Fine motor skills are intact with normal finger taps, normal hand movements, normal rapid alternating patting, normal foot taps and normal foot agility.  Cerebellar testing shows no dysmetria or intention tremor on finger to nose testing. Heel to shin is unremarkable bilaterally. There is no truncal or gait ataxia.  Sensory exam is intact to light touch, pinprick, vibration, temperature sense and proprioception in the upper and lower extremities.  Gait, station and balance: She stands up slowly and walks cautiously. No veering to one side is noted. No leaning to one side is noted. Posture is age-appropriate and stance is narrow based. No problems turning are noted. She turns in 3 steps. Tandem walk is not possible. She has difficultly with toe and heel stance.               Assessment and Plan:   In summary, Kathryn Gates is a very pleasant 79 y.o.-year old female with an underlying medical history of anxiety, reflux disease, hypertension, hyponatremia, hyperlipidemia, osteoarthritis, who was recently diagnosed with TIA. She presents with intermittent episodic headaches which are primarily described as a tingling sensation in her head associated with mouth dryness, followed by fatigue. She also has slurring of speech during the headache which lasts for a few hours at the most. The slurring of speech is attributed to severe mouth dryness. Her physical exam today is nonfocal. She does seem to have some atypical presentation. I am not sure that these are true TIAs. This does not fit the criteria for migraine headaches are tension-type headaches. I do wonder if some of this is stress related. She does endorse some  significant stressors since earlier this year. She has a very independent lady and has been living with her only son but since he moved and his girlfriend in January of this year she has not been feeling so well. He does not contribute financially. She has not asked him to. She does not consent to his girlfriend living with them. She is having a difficult time coping with this. She wonders if she can have se some medication for stress. I have encouraged her to bring this up with you at her next visit. In the interim, from my end of things I would like to order an EEG. I talked to her about this test. Given the episodic nature of  her presentation as well as the fatigue that follows the headache I would like to make sure her brain wave test is fine. For symptomatic treatment of her tingling and headache I suggested a very low dose of gabapentin at night. We will start at 100 mg at bedtime. I talked to her about common side effects and gave her written instructions. We talked about common headache triggers as well.  I had a long chat with the patient and her family about my findings and the diagnosis of atypical HAs, the prognosis and treatment options. We talked about medical treatments and non-pharmacological approaches. We talked about maintaining a healthy lifestyle in general. I encouraged the patient to eat healthy, exercise daily and keep well hydrated, to keep a scheduled bedtime and wake time routine, to not skip any meals and eat healthy snacks in between meals and to have protein with every meal. I also encouraged her to talk with her son about her stressors.    I advised the patient about common headache triggers: sleep deprivation, dehydration, overheating, stress, hypoglycemia or skipping meals and blood sugar fluctuations, excessive pain medications or excessive alcohol use or caffeine withdrawal. Some people have food triggers such as aged cheese, orange juice or chocolate, especially dark chocolate,  or MSG (monosodium glutamate). She is to try to avoid these headache triggers as much possible. It may be helpful to keep a headache diary to figure out what makes Her headaches worse or brings them on and what alleviates them. Some people report headache onset after exercise but studies have shown that regular exercise may actually prevent headaches from coming. If She has exercise-induced headaches, She is advised to drink plenty of fluid before and after exercising and that to not overdo it and to not overheat.  As far as further diagnostic testing is concerned, I suggested the following today: EEG.  As far as medications are concerned, I recommended the following at this time: trial of Neurontin.  I answered all her questions today and the patient and her family were in agreement with the above outlined plan. I would like to see the patient back in 3 months, sooner if the need arises and encouraged her to call with any interim questions, concerns, problems or updates and refill requests and test results.   Thank you very much for allowing me to participate in the care of this nice patient. If I can be of any further assistance to you please do not hesitate to call me at [phone].  Sincerely,   Kathryn Foley, MD, PhD

## 2013-04-14 NOTE — Patient Instructions (Addendum)
I think overall you are doing fairly well but I do want to suggest a few things today:  Remember to drink plenty of fluid, eat healthy meals and do not skip any meals. Try to eat protein with a every meal and eat a healthy snack such as fruit or nuts in between meals. Try to keep a regular sleep-wake schedule and try to exercise daily, particularly in the form of walking, 20-30 minutes a day, if you can.   Please remember, common headache triggers are: sleep deprivation, dehydration, overheating, stress, hypoglycemia or skipping meals and blood sugar fluctuations, excessive pain medications or excessive alcohol use or caffeine withdrawal. Some people have food triggers such as aged cheese, orange juice or chocolate, especially dark chocolate, or MSG (monosodium glutamate). Try to avoid these headache triggers as much possible. It may be helpful to keep a headache diary to figure out what makes your headaches worse or brings them on and what alleviates them. Some people report headache onset after exercise but studies have shown that regular exercise may actually prevent headaches from coming. If you have exercise-induced headaches, please make sure that you drink plenty of fluid before and after exercising and that you do not over do it and do not overheat.  As far as your medications are concerned, I would like to suggest a trial of Take 1 pill nightly at bedtime. The most common side effects reported are sedation or sleepiness. Rare side effects include balance problems, confusion.    As far as diagnostic testing: EEG, which is an electrical brain wave test.   I would like to see you back in 3 months, sooner if we need to. Please call us with any interim questions, concerns, problems, updates or refill requests.  Please also call us for any test results so we can go over those with you on the phone. Brett Canales is my clinical assistant and will answer any of your questions and relay your messages to me and  also relay most of my messages to you.  Our phone number is [phone]. We also have an after hours call service for urgent matters and there is a physician on-call for urgent questions. For any emergencies you know to call 911 or go to the nearest emergency room.

## 2013-04-24 ENCOUNTER — Other Ambulatory Visit: Payer: Medicare Other

## 2013-04-25 ENCOUNTER — Other Ambulatory Visit: Payer: Self-pay | Admitting: *Deleted

## 2013-04-25 DIAGNOSIS — R4781 Slurred speech: Secondary | ICD-10-CM

## 2013-04-25 DIAGNOSIS — G44309 Post-traumatic headache, unspecified, not intractable: Secondary | ICD-10-CM

## 2013-04-25 DIAGNOSIS — R202 Paresthesia of skin: Secondary | ICD-10-CM

## 2013-06-02 ENCOUNTER — Ambulatory Visit (INDEPENDENT_AMBULATORY_CARE_PROVIDER_SITE_OTHER): Payer: Medicare Other | Admitting: Radiology

## 2013-06-02 DIAGNOSIS — R51 Headache: Secondary | ICD-10-CM

## 2013-06-02 DIAGNOSIS — R209 Unspecified disturbances of skin sensation: Secondary | ICD-10-CM

## 2013-06-02 DIAGNOSIS — R569 Unspecified convulsions: Secondary | ICD-10-CM

## 2013-06-10 ENCOUNTER — Telehealth: Payer: Self-pay | Admitting: Neurology

## 2013-06-10 NOTE — Telephone Encounter (Signed)
Please ask patient where she had the MRI done. I do not see it under imaging or media in her chart.

## 2013-06-10 NOTE — Telephone Encounter (Signed)
Please advise 

## 2013-06-11 ENCOUNTER — Telehealth: Payer: Self-pay | Admitting: Neurology

## 2013-06-11 NOTE — Telephone Encounter (Signed)
Patient said that she was not sure, was about to have company and will call back later

## 2013-06-11 NOTE — Telephone Encounter (Signed)
Patient calling about results, says she has been waiting for a call back from Dr. Frances Furbish all afternoon. Please call.

## 2013-06-12 NOTE — Telephone Encounter (Signed)
Called patient and informed that could not find recent MRI, in chart, to call back for more clarification of test

## 2013-06-13 NOTE — Telephone Encounter (Signed)
Called and left VM message for return call concerning MRI brain

## 2013-06-13 NOTE — Telephone Encounter (Signed)
Please advise patient that she had a brain MRI at the time of her admission to [facility] in June and the treating doctors would have had discussed the findings with her at the time. Nevertheless, it showed no acute stroke at the time. She had a benign tumor of the acoustic nerve and was advised to followup with ENT doctor, Dr. [name] for that.

## 2013-06-13 NOTE — Telephone Encounter (Signed)
Returning Dr. Teofilo Pod call. She had MRI at [facility] it was back in June during her admission on the 18th

## 2013-06-16 ENCOUNTER — Telehealth: Payer: Self-pay | Admitting: Neurology

## 2013-06-16 NOTE — Telephone Encounter (Signed)
Patient returning your call. Please call the patient. °

## 2013-06-16 NOTE — Telephone Encounter (Signed)
Shared results of MRI w/ patient per Dr Frances Furbish, patient verbalized understanding, now requesting results from EEG results.

## 2013-06-17 NOTE — Telephone Encounter (Signed)
I called and talked with the patient. She is advised to start Neurontin for her headaches. She had not started it yet. I prescribed 100 mg strength one pill each night at the time of her visit. I apologized for the delay in getting back with her. I also apologized for not having the result of her EEG just yet. I am going to try to find out where the results are. She has had no new symptoms. She has no new reason to have repeat brain scan done. I explained the findings from June of this year again to her. She sounded reassured and was willing to give the Neurontin a try. I explained to her that this was not a pain pill and may not work and one day only. She may have to take this for a few days and we will start cautiously at a low dose. She was in agreement.

## 2013-06-17 NOTE — Telephone Encounter (Signed)
EEG results not in epic

## 2013-06-17 NOTE — Telephone Encounter (Signed)
Patient's son called and is concerned for his mother, states that she has not received any results about her EEG and she is still having issues and is slowly getting worse. Patient's son wants to get her in for an appointment. Please call him back at the number listed.

## 2013-06-18 ENCOUNTER — Telehealth: Payer: Self-pay | Admitting: Neurology

## 2013-06-18 NOTE — Telephone Encounter (Signed)
I called the patient.  The EEG study was unremarkable. 

## 2013-06-18 NOTE — Progress Notes (Signed)
Quick Note:  Please call and advise the patient that the EEG or brain wave test we performed was reported as normal in the awake state. We checked for abnormal electrical discharges in the brain waves and the report suggested normal findings. No further action is required on this test at this time. Please remind patient to keep any upcoming appointments or tests and to call us with any interim questions, concerns, problems or updates. Thanks,  Hasel Janish, MD, PhD    ______ 

## 2013-06-18 NOTE — Procedures (Signed)
    History:  Kathryn Gates is an 79 year old patient with a history of anxiety, hypertension, hyponatremia, dyslipidemia, and arthritis. The patient has a history of cerebrovascular disease with a recent TIA event. The patient has had episodic headaches associated with a tingling sensation in the head and mouth dryness. The patient is being evaluated for these events.  This is a routine EEG. No skull defects are noted. Medications include alprazolam, aspirin, Plavix, gabapentin, metoprolol, Pravachol, AcipHex, and Diovan.   EEG classification: Essentially normal awake  Description of the recording: The background rhythms of this recording consists of a fairly well modulated medium amplitude alpha rhythm of 9 Hz that is reactive to eye opening and closure. As the record progresses, the patient appears to remain in the waking state throughout the recording. Photic stimulation was performed, resulting in a bilateral and symmetric photic driving response. Hyperventilation was also performed, resulting in a minimal buildup of the background rhythm activities without significant slowing seen. At no time during the recording does there appear to be evidence of spike or spike wave discharges or evidence of focal slowing. EKG monitor shows no evidence of cardiac rhythm abnormalities with a heart rate of 72.  Impression: This is an essentially normal EEG recording in the waking state. No evidence of ictal or interictal discharges are seen.

## 2013-06-23 DIAGNOSIS — K219 Gastro-esophageal reflux disease without esophagitis: Secondary | ICD-10-CM | POA: Diagnosis present

## 2013-06-23 DIAGNOSIS — F411 Generalized anxiety disorder: Secondary | ICD-10-CM | POA: Insufficient documentation

## 2013-08-05 ENCOUNTER — Encounter: Payer: Self-pay | Admitting: Cardiology

## 2013-08-05 ENCOUNTER — Ambulatory Visit (INDEPENDENT_AMBULATORY_CARE_PROVIDER_SITE_OTHER): Payer: Medicare Other | Admitting: Cardiology

## 2013-08-05 VITALS — BP 158/70 | HR 97 | Ht 61.0 in | Wt 113.3 lb

## 2013-08-05 DIAGNOSIS — R42 Dizziness and giddiness: Secondary | ICD-10-CM

## 2013-08-05 DIAGNOSIS — R55 Syncope and collapse: Secondary | ICD-10-CM

## 2013-08-05 NOTE — Patient Instructions (Signed)
Your physician recommends that you schedule a follow-up appointment in: [place] has recommended that you wear an event monitor. Event monitors are medical devices that record the heart's electrical activity. Doctors most often [place] these monitors to diagnose arrhythmias. Arrhythmias are problems with the speed or rhythm of the heartbeat. The monitor is a small, portable device. You can wear one while you do your normal daily activities. This is usually used to diagnose what is causing palpitations/syncope (passing out).

## 2013-08-05 NOTE — Progress Notes (Signed)
HPI The patient presents for evaluation of episodes of slurred speech and some altered mental status. She said that since May of last year this has been occurring sporadically. It starts as a tingling in the top of her head. Her mouth is dry. Her speech gets slurred. She might develop a cough. Her sinuses dry up and then when it resolves they start running. Her knees feel weak. Her friend and family notices that when this happens her mental status is decreased. She will sleep suddenly and the bleeding. She was admitted last year and I reviewed these records. Head CT was negative. Carotids demonstrate some mild obstruction. She had an echocardiogram which demonstrated some mild aortic stenosis but a well preserved ejection fraction. She did see a neurologist and I reviewed these notes. There was no clear etiology with a negative EEG. However, therapy with medications for possible seizure was apparently suggested and the patient declined. She does not report any palpitations. She does mention that when these happen her blood pressure falls sometimes to 80/30. She describes a very mild orthostatic symptoms. When she is not having one of these episodes she says that she has no symptoms. She denies chest pressure, neck or arm discomfort. She denies any PND or orthopnea.  She was previously seen by Dr. Rollene Fare.  He started her on Plavix last year for what was possibly a TIA the symptoms above. He has followed her for some mild aortic insufficiency and stenosis.  No Known Allergies  Current Outpatient Prescriptions  Medication Sig Dispense Refill  . ALPRAZolam (XANAX) 0.25 MG tablet Take 0.125 mg by mouth 2 (two) times daily as needed for anxiety.       Marland Kitchen aspirin EC 81 MG tablet Take 81 mg by mouth daily.      . clopidogrel (PLAVIX) 75 MG tablet Take 1 tablet by mouth daily.      . metoprolol succinate (TOPROL-XL) 25 MG 24 hr tablet Take 12.5 mg by mouth daily as needed.       . pravastatin (PRAVACHOL)  20 MG tablet Take 20 mg by mouth at bedtime.      . RABEprazole (ACIPHEX) 20 MG tablet Take 20 mg by mouth daily.      . valsartan (DIOVAN) 160 MG tablet Take 160 mg by mouth daily.      . valsartan (DIOVAN) 80 MG tablet Take 80 mg by mouth daily as needed.       No current facility-administered medications for this visit.    Past Medical History  Diagnosis Date  . Aortic regurgitation     Moderate  . Hyperlipidemia   . Hypertension   . Carotid stenosis     Past Surgical History  Procedure Laterality Date  . Cesarean section  1973  . Wrist surgery  2007    Right  . Cholecystectomy    . Cataract extraction      Family History  Problem Relation Age of Onset  . Stroke Other   . Heart failure Other     CHF    History   Social History  . Marital Status: Widowed    Spouse Name: N/A    Number of Children: 1  . Years of Education: N/A   Occupational History  . Not on file.   Social History Main Topics  . Smoking status: Never Smoker   . Smokeless tobacco: Not on file     Comment: Tobacco use-no  . Alcohol Use: No  . Drug Use: No  .  Sexual Activity: Not on file   Other Topics Concern  . Not on file   Social History Narrative   No regular exercise. Her son lives with her.  She is hear with her sister and care giver.     ROS:  Positive for decreased appetite.  PHYSICAL EXAM BP 158/70  Pulse 97  Ht 5\' 1"  (1.549 m)  Wt 113 lb 4.8 oz (51.393 kg)  BMI 21.42 kg/m2 GENERAL:  Well appearing HEENT:  Pupils equal round and reactive, fundi not visualized, oral mucosa unremarkable NECK:  No jugular venous distention, waveform within normal limits, carotid upstroke brisk and symmetric, no bruits, no thyromegaly LYMPHATICS:  No cervical, inguinal adenopathy LUNGS:  Clear to auscultation bilaterally BACK:  No CVA tenderness CHEST:  Unremarkable HEART:  PMI not displaced or sustained,S1 and S2 within normal limits, no S3, no S4, no clicks, no rubs, 2/6 apical systolic  early peaking murmur without diastolic murmurs ABD:  Flat, positive bowel sounds normal in frequency in pitch, no bruits, no rebound, no guarding, no midline pulsatile mass, no hepatomegaly, no splenomegaly EXT:  2 plus pulses throughout, no edema, no cyanosis no clubbing SKIN:  No rashes no nodules NEURO:  Cranial nerves II through XII grossly intact, motor grossly intact throughout PSYCH:  Cognitively intact, oriented to person place and time   EKG:  Sinus rhythm, rate 97, axis within normal limits, intervals within normal limits, nonspecific inferolateral T wave changes. 08/05/2013  ASSESSMENT AND PLAN  DIZZINESS:  The symptoms are atypical and not associated with valvular disease. This is more likely to be neurologic though no clear etiology is forthcoming. I will check a 21 day event monitor. She will need a 21 day event monitor.  The patients symptoms necessitate an event monitor.  The symptoms are too infrequent to be identified on a holter monitor.    However, if this is not forthcoming of any etiology she has agreed to follow back up with neurology to further discuss these events.  AS:  The patient has mild aortic stenosis. I don't hear aortic insufficiency. No further imaging is indicated and this can be followed clinically.  CAROTID STENOSIS:  She has had mild carotid disease and I will follow this up in the summer.  HTN:  Given the labile blood pressure I will not change her medications.

## 2013-08-07 ENCOUNTER — Encounter (HOSPITAL_COMMUNITY): Payer: Self-pay | Admitting: Emergency Medicine

## 2013-08-07 ENCOUNTER — Emergency Department (HOSPITAL_COMMUNITY): Payer: Medicare Other

## 2013-08-07 ENCOUNTER — Emergency Department (HOSPITAL_COMMUNITY)
Admission: EM | Admit: 2013-08-07 | Discharge: 2013-08-07 | Disposition: A | Discharge status: Home / Self Care | Payer: Medicare Other | Attending: Emergency Medicine | Admitting: Emergency Medicine

## 2013-08-07 DIAGNOSIS — I359 Nonrheumatic aortic valve disorder, unspecified: Secondary | ICD-10-CM | POA: Insufficient documentation

## 2013-08-07 DIAGNOSIS — Z7902 Long term (current) use of antithrombotics/antiplatelets: Secondary | ICD-10-CM | POA: Insufficient documentation

## 2013-08-07 DIAGNOSIS — R4789 Other speech disturbances: Secondary | ICD-10-CM | POA: Insufficient documentation

## 2013-08-07 DIAGNOSIS — R209 Unspecified disturbances of skin sensation: Secondary | ICD-10-CM | POA: Insufficient documentation

## 2013-08-07 DIAGNOSIS — I6529 Occlusion and stenosis of unspecified carotid artery: Secondary | ICD-10-CM | POA: Insufficient documentation

## 2013-08-07 DIAGNOSIS — Z7982 Long term (current) use of aspirin: Secondary | ICD-10-CM | POA: Insufficient documentation

## 2013-08-07 DIAGNOSIS — I10 Essential (primary) hypertension: Secondary | ICD-10-CM | POA: Insufficient documentation

## 2013-08-07 DIAGNOSIS — R4781 Slurred speech: Secondary | ICD-10-CM

## 2013-08-07 DIAGNOSIS — Z79899 Other long term (current) drug therapy: Secondary | ICD-10-CM | POA: Insufficient documentation

## 2013-08-07 DIAGNOSIS — E785 Hyperlipidemia, unspecified: Secondary | ICD-10-CM | POA: Insufficient documentation

## 2013-08-07 LAB — COMPREHENSIVE METABOLIC PANEL
ALK PHOS: 71 U/L (ref 39–117)
ALT: 11 U/L (ref 0–35)
AST: 16 U/L (ref 0–37)
Albumin: 3.9 g/dL (ref 3.5–5.2)
BILIRUBIN TOTAL: 0.5 mg/dL (ref 0.3–1.2)
BUN: 11 mg/dL (ref 6–23)
CHLORIDE: 97 meq/L (ref 96–112)
CO2: 29 mEq/L (ref 19–32)
Calcium: 9.7 mg/dL (ref 8.4–10.5)
Creatinine, Ser: 0.82 mg/dL (ref 0.50–1.10)
GFR calc non Af Amer: 65 mL/min — ABNORMAL LOW (ref >90)
GFR, EST AFRICAN AMERICAN: 76 mL/min — AB (ref >90)
GLUCOSE: 135 mg/dL — AB (ref 70–99)
POTASSIUM: 4.5 meq/L (ref 3.7–5.3)
Sodium: 136 mEq/L — ABNORMAL LOW (ref 137–147)
Total Protein: 7.1 g/dL (ref 6.0–8.3)

## 2013-08-07 LAB — CBC WITH DIFFERENTIAL/PLATELET
Basophils Absolute: 0 10*3/uL (ref 0.0–0.1)
Basophils Relative: 1 % (ref 0–1)
Eosinophils Absolute: 0.1 10*3/uL (ref 0.0–0.7)
Eosinophils Relative: 1 % (ref 0–5)
HCT: 33.6 % — ABNORMAL LOW (ref 36.0–46.0)
HEMOGLOBIN: 11.7 g/dL — AB (ref 12.0–15.0)
Lymphocytes Relative: 14 % (ref 12–46)
Lymphs Abs: 1 10*3/uL (ref 0.7–4.0)
MCH: 29.9 pg (ref 26.0–34.0)
MCHC: 34.8 g/dL (ref 30.0–36.0)
MCV: 85.9 fL (ref 78.0–100.0)
MONOS PCT: 5 % (ref 3–12)
Monocytes Absolute: 0.3 10*3/uL (ref 0.1–1.0)
NEUTROS ABS: 5.4 10*3/uL (ref 1.7–7.7)
NEUTROS PCT: 79 % — AB (ref 43–77)
Platelets: 290 10*3/uL (ref 150–400)
RBC: 3.91 MIL/uL (ref 3.87–5.11)
RDW: 13.4 % (ref 11.5–15.5)
WBC: 6.9 10*3/uL (ref 4.0–10.5)

## 2013-08-07 LAB — GLUCOSE, CAPILLARY: GLUCOSE-CAPILLARY: 135 mg/dL — AB (ref 70–99)

## 2013-08-07 NOTE — ED Provider Notes (Signed)
CSN: [id number]     Arrival date & time 08/07/13  1513 History  This chart was scribed for Kathryn Diego, MD by Jenne Campus, ED Scribe. This patient was seen in room APA02/APA02 and the patient's care was started at 3:39 PM.    CC: "Scalp tingling"  Patient is a 80 y.o. female presenting with general illness. The history is provided by the patient. No language interpreter was used.  Illness Location:  Scalp Quality:  Tingling Severity:  Moderate Onset quality:  Sudden Duration:  5 hours Timing:  Constant Progression:  Unchanged Chronicity:  Recurrent Relieved by:  Nothing Worsened by:  Nothing Ineffective treatments:  Prescribed seizure med but has not been taking it Associated symptoms: no abdominal pain, no chest pain, no congestion, no cough, no diarrhea, no fatigue, no headaches and no rash     HPI Comments: [name] is a 80 y.o. female who presents to the Emergency Department complaining of sudden onset "scalp tingling" that started at 10 AM this morning with mild associated slurred speech. She admits that the symptoms have been going on since May 2014.  She has been seen by neurology for the same with a normal MRI and EEG. She was prescribed seizure medication but has not been taking it. She denies any extremity weakness or other deficits. She has no h/o CVA or TIA.   Past Medical History  Diagnosis Date  . Aortic regurgitation     Moderate  . Hyperlipidemia   . Hypertension   . Carotid stenosis    Past Surgical History  Procedure Laterality Date  . Cesarean section  1973  . Wrist surgery  2007    Right  . Cholecystectomy    . Cataract extraction     Family History  Problem Relation Age of Onset  . Stroke Other   . Heart failure Other     CHF   History  Substance Use Topics  . Smoking status: Never Smoker   . Smokeless tobacco: Not on file     Comment: Tobacco use-no  . Alcohol Use: No   No OB history provided.  Review of Systems   Constitutional: Negative for appetite change and fatigue.  HENT: Negative for congestion, ear discharge and sinus pressure.   Eyes: Negative for discharge.  Respiratory: Negative for cough.   Cardiovascular: Negative for chest pain.  Gastrointestinal: Negative for abdominal pain and diarrhea.  Genitourinary: Negative for frequency and hematuria.  Musculoskeletal: Negative for back pain.  Skin: Negative for rash.  Neurological: Positive for speech difficulty. Negative for seizures, facial asymmetry, weakness and headaches.  Psychiatric/Behavioral: Negative for hallucinations.      Allergies  Review of patient's allergies indicates no known allergies.  Home Medications   Current Outpatient Rx  Name  Route  Sig  Dispense  Refill  . ALPRAZolam (XANAX) 0.25 MG tablet   Oral   Take 0.125 mg by mouth 2 (two) times daily as needed for anxiety.          Kathryn Gates aspirin EC 81 MG tablet   Oral   Take 81 mg by mouth daily.         . clopidogrel (PLAVIX) 75 MG tablet   Oral   Take 1 tablet by mouth daily.         . metoprolol succinate (TOPROL-XL) 25 MG 24 hr tablet   Oral   Take 12.5 mg by mouth daily as needed.          Kathryn Gates  pravastatin (PRAVACHOL) 20 MG tablet   Oral   Take 20 mg by mouth at bedtime.         . RABEprazole (ACIPHEX) 20 MG tablet   Oral   Take 20 mg by mouth daily.         . valsartan (DIOVAN) 160 MG tablet   Oral   Take 160 mg by mouth daily.         . valsartan (DIOVAN) 80 MG tablet   Oral   Take 80 mg by mouth daily as needed.          Triage Vitals: BP 124/47  Pulse 42  Temp(Src) 97.5 F (36.4 C) (Oral)  Resp 12  SpO2 96% Physical Exam  Nursing note and vitals reviewed. Constitutional: She is oriented to person, place, and time. She appears well-developed and well-nourished.  HENT:  Head: Normocephalic and atraumatic.  Eyes: Conjunctivae and EOM are normal. No scleral icterus.  Neck: Neck supple. No thyromegaly present.   Cardiovascular: Normal rate and regular rhythm.  Exam reveals no gallop and no friction rub.   No murmur heard. Pulmonary/Chest: Effort normal and breath sounds normal. No stridor. She has no wheezes. She has no rales. She exhibits no tenderness.  Abdominal: She exhibits no distension. There is no tenderness. There is no rebound.  Musculoskeletal: Normal range of motion. She exhibits no edema.  Lymphadenopathy:    She has no cervical adenopathy.  Neurological: She is alert and oriented to person, place, and time. She exhibits normal muscle tone. Coordination normal.  Mild slurred speech, no strength deficits   Skin: No rash noted. No erythema.  Psychiatric: She has a normal mood and affect. Her behavior is normal.    ED Course  Procedures (including critical care time)  DIAGNOSTIC STUDIES: Oxygen Saturation is 96% on RA, adequate by my interpretation.    COORDINATION OF CARE: Spoke with teleneurologist who advises that pt does not need admission. Sxs are not CVA or TIA related. Advised to have pt start her seizure medication.  5:55 PM-Informed pt of neuro consult, radiology and lab work results. Discussed discharge plan which includes taking the seizure medication with pt and pt agreed to plan. Also advised pt to follow up as needed and pt agreed. Addressed symptoms to return for with pt.   Labs Review Labs Reviewed  CBC WITH DIFFERENTIAL - Abnormal; Notable for the following:    Hemoglobin 11.7 (*)    HCT 33.6 (*)    Neutrophils Relative % 79 (*)    All other components within normal limits  COMPREHENSIVE METABOLIC PANEL - Abnormal; Notable for the following:    Sodium 136 (*)    Glucose, Bld 135 (*)    GFR calc non Af Amer 65 (*)    GFR calc Af Amer 76 (*)    All other components within normal limits  GLUCOSE, CAPILLARY - Abnormal; Notable for the following:    Glucose-Capillary 135 (*)    All other components within normal limits   Imaging Review Ct Head Wo  Contrast  08/07/2013   CLINICAL DATA:  Tingling in scalp. Dryness of the mouth. Weakness. Hypotension.  EXAM: CT HEAD WITHOUT CONTRAST  TECHNIQUE: Contiguous axial images were obtained from the base of the skull through the vertex without intravenous contrast.  COMPARISON:  None.  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 The porus acousticus measures about 8 mm on the left and 5 mm on the right.  Posterior fossa structures  otherwise unremarkable. The thalami, brainstem, basal ganglia, and ventricular system appears normal.  No intracranial hemorrhage, additional mass lesion, or acute CVA identified.  IMPRESSION: 1. 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 Otherwise negative exam. These results were called by telephone at the time of interpretation on 08/07/2013 at 4:19 PM to Dr. Milton Ferguson , who verbally acknowledged these results.   Electronically Signed   By: Sherryl Barters M.D.   On: 08/07/2013 16:17    EKG Interpretation   None       MDM   Final diagnoses:  None    The chart was scribed for me under my direct supervision.  I personally performed the history, physical, and medical decision making and all procedures in the evaluation of this patient.Kathryn Diego, MD 08/07/13 2141

## 2013-08-07 NOTE — ED Notes (Signed)
Reports sx started around 0800 this morning.  States has severe generalized weakness when tingling starts in head with low bp and slurred speech.  Speech slurred at times, face symmetrical, denies numbness/tingling in face, slight arm drift on right side, weak right grip strength compared to left.  Alert and oriented x 4.  nad noted.

## 2013-08-07 NOTE — ED Notes (Signed)
[facility] called,paperwork faxed and monitor placed in the room, waiting for consult.

## 2013-08-07 NOTE — ED Notes (Signed)
teleneurology consult being completed at this time.

## 2013-08-07 NOTE — ED Notes (Signed)
Pt reports intermittent "tingling in scalp" since May.  Pt says when has these episodes it "affects my whole body."  Pt says mouth gets dry, gets extrememly weak, and bp drops.  Reports episodes last approx 3 or 4 days after the tingling episode.  Pt says right now feels very weak all over.

## 2013-08-07 NOTE — Discharge Instructions (Signed)
Take your seizure medicine and follow up your neurologist

## 2013-08-07 NOTE — ED Notes (Signed)
Pt reports is having a hard time staying awake.  While pt is sleeping/resting, HR goes down to lowest at 39 bpm, still SB on monitor.  HR increases to mid 50s with stimulation.  edp notified.

## 2013-08-15 ENCOUNTER — Encounter: Payer: Self-pay | Admitting: Cardiology

## 2013-08-18 ENCOUNTER — Encounter: Payer: Self-pay | Admitting: Neurology

## 2013-08-18 ENCOUNTER — Ambulatory Visit (INDEPENDENT_AMBULATORY_CARE_PROVIDER_SITE_OTHER): Payer: Medicare Other | Admitting: Neurology

## 2013-08-18 VITALS — BP 191/65 | HR 75 | Temp 97.9°F | Ht 61.0 in | Wt 114.0 lb

## 2013-08-18 DIAGNOSIS — R209 Unspecified disturbances of skin sensation: Secondary | ICD-10-CM

## 2013-08-18 DIAGNOSIS — R51 Headache: Secondary | ICD-10-CM

## 2013-08-18 DIAGNOSIS — R4781 Slurred speech: Secondary | ICD-10-CM

## 2013-08-18 DIAGNOSIS — R4789 Other speech disturbances: Secondary | ICD-10-CM

## 2013-08-18 DIAGNOSIS — I359 Nonrheumatic aortic valve disorder, unspecified: Secondary | ICD-10-CM

## 2013-08-18 DIAGNOSIS — R202 Paresthesia of skin: Secondary | ICD-10-CM

## 2013-08-18 DIAGNOSIS — I959 Hypotension, unspecified: Secondary | ICD-10-CM

## 2013-08-18 NOTE — Progress Notes (Signed)
Subjective:    Patient ID: Kathryn Gates is a 80 y.o. female.  HPI    Interim history:   Kathryn Gates is an 80 year old right-handed woman with a complex medical history of anxiety, reflux disease, hypertension, hyponatremia, hyperlipidemia, osteoarthritis, and prior diagnosis of TIA, who presents for followup consultation of her paresthesias. She is accompanied by her daughter and a friend, [name], today. I first met her on 04/14/2013, at which time she reported intermittent headaches, tingling in her hand, associated with mouth dryness, followed by fatigue. She has had intermittent slurring of speech. She reported stress. She was wondering if her symptoms were stress induced. Her physical exam is nonfocal. She was diagnosed with a TIA in the past. I suggested further workup in the form of EEG. For symptomatic treatment of her tingling and headaches have suggested gabapentin low-dose. She did not started. Her EEG on 06/18/2013 was reported as normal in the awake state. Dr. Jannifer Franklin called her with the test results. In December I talked to her and encouraged her to try gabapentin. On 08/05/2013 she was seen by her cardiologist. He suggested a 3 week event monitor as a Holter monitor would not capture the events as they were too infrequent. She presented to the emergency room on 08/07/2013 with slurring of speech and tingling. 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 Otherwise negative exam. I personally reviewed the images through the PACS system as well.   Today, she reports that she has had some additional, albeit milder paresthesias, in her head and she has had some BP drops, even into the 80s/40s. She has been on 2 doses of Diovan 160 mg plus/minus 80 mg depending on the AM BP. She is on the cardiac monitor for another week from today. She sees Dr. Percival Spanish. She is on baby ASA and Plavix.   She  presented to the emergency room on 03/16/2013 with generalized weakness and was found to have low blood pressure values, low-sodium, low potassium, and was diagnosed with a urinary tract infection. She presented with a diffuse headache. She has previously had workup for suspected TIA including head CT which was reported as within normal limits, carotid Doppler studies recently which showed less than 50% stenoses, MRI of the brain in June which was reportedly normal, echocardiogram which was reportedly normal. She has had paresthesias. I reviewed her brain MRI report from 01/17/2012 which showed no acute or focal abnormality to explain the patient's symptoms. Symptoms at the time were memory loss and abnormal gait. There were scattered subcortical T2 hyperintensities likely within normal limits for age. I reviewed blood work from your office from 03/25/2013 as well as 03/31/2013 which showed normal CMPs.  She was diagnosed with TIA in June 2014, when she presented to the ER with HA/tingling in her head, dry mouth, low blood pressure. Prior to that she presented to [facility] with similar Sx, where she had had a carotid doppler done that showed RICA 57% and LICA <01%. CXR was unremakable and negative CT of the head. No MRI was done at that time. Her Sodium was noted to be slightly low at 130, with K of 4.5 . At [facility], she stayed from 12/11/12-12/13/12 and had an Echo showed only mild diastolic dysfunction. LDL 152, started on simvastatin. She declined HH PT. She had an MRI brain and MRA head on 12/12/12: No acute infarct. Mild small vessel disease type  changes. No intracranial hemorrhage. 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 Intracranial atherosclerotic type changes more notable involve the posterior circulation as detailed above. She was treated for a UTI in 6/14 and 9/14.   Her Past Medical History Is Significant For: Past Medical History  Diagnosis Date  .  Aortic regurgitation     Moderate  . Hyperlipidemia   . Hypertension   . Carotid stenosis     Her Past Surgical History Is Significant For: Past Surgical History  Procedure Laterality Date  . Cesarean section  1973  . Wrist surgery  2007    Right  . Cholecystectomy    . Cataract extraction      Her Family History Is Significant For: Family History  Problem Relation Age of Onset  . Stroke Other   . Heart failure Other     CHF    Her Social History Is Significant For: History   Social History  . Marital Status: Widowed    Spouse Name: N/A    Number of Children: 1  . Years of Education: N/A   Social History Main Topics  . Smoking status: Never Smoker   . Smokeless tobacco: None     Comment: Tobacco use-no  . Alcohol Use: No  . Drug Use: No  . Sexual Activity: None   Other Topics Concern  . None   Social History Narrative   No regular exercise. Her son lives with her.  She is hear with her sister and care giver.     Her Allergies Are:  No Known Allergies:   Her Current Medications Are:  Outpatient Encounter Prescriptions as of 08/18/2013  Medication Sig  . ALPRAZolam (XANAX) 0.25 MG tablet Take 0.125 mg by mouth 2 (two) times daily as needed for anxiety.   Marland Kitchen aspirin EC 81 MG tablet Take 81 mg by mouth daily.  . clopidogrel (PLAVIX) 75 MG tablet Take 1 tablet by mouth daily.  Marland Kitchen gabapentin (NEURONTIN) 100 MG capsule Take 100 mg by mouth at bedtime.  . metoprolol succinate (TOPROL-XL) 25 MG 24 hr tablet Take 12.5 mg by mouth daily as needed.   . pravastatin (PRAVACHOL) 20 MG tablet Take 20 mg by mouth at bedtime.  . RABEprazole (ACIPHEX) 20 MG tablet Take 20 mg by mouth daily.  . valsartan (DIOVAN) 160 MG tablet Take 160 mg by mouth daily.  . valsartan (DIOVAN) 80 MG tablet Take 80 mg by mouth daily as needed.   Review of Systems:  Out of a complete 14 point review of systems, all are reviewed and negative with the exception of these symptoms as listed  below:   Review of Systems  Constitutional: Negative.   HENT: Positive for rhinorrhea.   Eyes: Positive for photophobia.  Respiratory: Negative.   Cardiovascular:       Murmur  Gastrointestinal: Negative.   Endocrine: Negative.   Genitourinary: Negative.   Musculoskeletal: Negative.   Skin: Negative.   Allergic/Immunologic: Negative.   Neurological: Negative.   Hematological: Bruises/bleeds easily.  Psychiatric/Behavioral: The patient is nervous/anxious.     Objective:  Neurologic Exam  Physical Exam Physical Examination:   Filed Vitals:   08/18/13 1417  BP: 191/65  Pulse: 75  Temp: 97.9 F (36.6 C)   General Examination: The patient is a very pleasant 80 y.o. female in no acute distress. She appears well-developed and well-nourished and well groomed.   HEENT: Normocephalic, atraumatic, pupils are equal, round and reactive to light  and accommodation. Funduscopic exam is normal with sharp disc margins noted. Extraocular tracking is good without limitation to gaze excursion or nystagmus noted. Normal smooth pursuit is noted. Hearing is grossly intact. Face is symmetric with normal facial animation and normal facial sensation. Speech is clear with no dysarthria noted. There is no hypophonia. There is no lip, neck/head, jaw or voice tremor. Neck is supple with full range of passive and active motion. There are no carotid bruits on auscultation. Oropharynx exam reveals: mild mouth dryness, adequate dental hygiene and mild airway crowding, due to floppy airway. Mallampati is class II. Tongue protrudes centrally and palate elevates symmetrically.   Chest: Clear to auscultation without wheezing, rhonchi or crackles noted.  Heart: S1+S2+0, regular; she had a murmur, but no rubs or gallops are noted.   Abdomen: Soft, non-tender and non-distended with normal bowel sounds appreciated on auscultation.  Extremities: There is puffiness, but no pitting edema in the distal lower extremities  bilaterally. Pedal pulses are intact.  Skin: Warm and dry without trophic changes noted. There are no varicose veins.  Musculoskeletal: exam reveals no obvious joint deformities, tenderness or joint swelling or erythema.   Neurologically:  Mental status: The patient is awake, alert and oriented in all 4 spheres. His memory, attention, language and knowledge are appropriate. There is no aphasia, agnosia, apraxia or anomia. Speech is clear with normal prosody and enunciation. Thought process is linear. Mood is congruent and affect is normal.  Cranial nerves are as described above under HEENT exam. In addition, shoulder shrug is normal with equal shoulder height noted. Motor exam: Normal bulk, strength and tone is noted. There is no drift, tremor or rebound. Romberg is negative. Reflexes are 2+ throughout. Toes are downgoing bilaterally. Fine motor skills are intact with normal finger taps, normal hand movements, normal rapid alternating patting, normal foot taps and normal foot agility.  Cerebellar testing shows no dysmetria or intention tremor on finger to nose testing. Heel to shin is unremarkable bilaterally. There is no truncal or gait ataxia.  Sensory exam is intact to light touch, pinprick, vibration, temperature sense in the upper and lower extremities.  Gait, station and balance: She stands up slowly and walks cautiously. No veering to one side is noted. No leaning to one side is noted. Posture is age-appropriate and stance is narrow based. No problems turning are noted. She turns in 3 steps. Tandem walk is very difficult for her and she states, she is afraid of falling. She has difficultly with toe and heel stance.              Assessment and Plan:   In summary, [place] Marcou is a very pleasant 80 year old female with an underlying medical history of anxiety, reflux disease, hypertension, hyponatremia, hyperlipidemia, osteoarthritis, who was recently diagnosed with TIA and presents for  followup consultation of her intermittent episodes of headaches, and had paresthesias as well as intermittent slurring of speech. She had an extensive workup when she was first felt to have a TIA. In addition, I ordered an EEG which was normal. She has had a couple of ER visits since then and her most recent head CT confirmed her acoustic neuroma and showed unchanged findings overall. Her physical exam today continues to be nonfocal. She does seem to have some atypical presentation. I am not sure that these are true TIAs. She does not really fit the criteria for migraine headaches or tension-type headaches. I do wonder if some of this is stress related.  She does endorse some significant stressors since earlier last year. I advised the patient again about common headache triggers: sleep deprivation, dehydration, overheating, stress, hypoglycemia or skipping meals and blood sugar fluctuations, excessive pain medications or excessive alcohol use or caffeine withdrawal. Some people have food triggers such as aged cheese, orange juice or chocolate, especially dark chocolate, or MSG (monosodium glutamate). She is to try to avoid these headache triggers as much possible. It may be helpful to keep a headache diary to figure out what makes Her headaches worse or brings them on and what alleviates them. Some people report headache onset after exercise but studies have shown that regular exercise may actually prevent headaches from coming. If She has exercise-induced headaches, She is advised to drink plenty of fluid before and after exercising and that to not overdo it and to not overheat. As far as further diagnostic testing is concerned, I suggested the following today: no new test from my end of things. I wonder, if she can have her BP medication, Diovan looked at again, as she is taking 160 mg or 160 mg plus 80 mg and on other days, she may take 80 mg only. She is trying to keep a log. She has seen Dr. Percival Spanish and has a  cardiac monitor in place. I wonder, if she would benefit from a ambulatory BP monitor. She is encouraged to discuss this with Dr. Percival Spanish.  As far as medications are concerned, I recommended the following at this time: Conitnue Neurontin 100 mg once daily, consider, switching to AM d/t insomnia reported. She does report, that since she started it, the tingling seems to be less intense and the HAs are milder.  I answered all her questions today and the patient and her family were in agreement with the above outlined plan. I would like to see the patient back in 4 months, sooner if the need arises and encouraged her to call with any interim questions, concerns, problems or updates and refill requests and test results.

## 2013-08-18 NOTE — Patient Instructions (Addendum)
Please remember, common headache triggers are: sleep deprivation, dehydration, overheating, stress, hypoglycemia or skipping meals and blood sugar fluctuations, excessive pain medications or excessive alcohol use or caffeine withdrawal. Some people have food triggers such as aged cheese, orange juice or chocolate, especially dark chocolate, or MSG (monosodium glutamate). Try to avoid these headache triggers as much possible. It may be helpful to keep a headache diary to figure out what makes your headaches worse or brings them on and what alleviates them. Some people report headache onset after exercise but studies have shown that regular exercise may actually prevent headaches from coming. If you have exercise-induced headaches, please make sure that you drink plenty of fluid before and after exercising and that you do not over do it and do not overheat.  You can try taking the gabapentin during the day, if it causes you to have insomnia.

## 2013-09-15 DIAGNOSIS — R634 Abnormal weight loss: Secondary | ICD-10-CM | POA: Insufficient documentation

## 2013-09-16 ENCOUNTER — Telehealth: Payer: Self-pay | Admitting: Cardiology

## 2013-09-16 NOTE — Telephone Encounter (Signed)
Having some issues with her bp , but would like for Dr.Hochrein to handle it rather than  her pcp. Please call   Thanks

## 2013-09-17 NOTE — Telephone Encounter (Signed)
Per pt call - states BP and HR have been elevated.  Today BP 156/64 and HR 119 when she got up.  She took Toprol 25 mg.  On recheck (1 hr after taking Toprol) BP is 184/68 and HR 99.  Yesterday BP was 178/61 HR 93.  Would like advise from DrHochrein on what to do to better control both.  She states she feels fine now but can tell when her HR is elevated.  Recent event monitor

## 2013-09-18 NOTE — Telephone Encounter (Signed)
Can she start to take the 25 mg of Toprol daily?

## 2013-09-19 NOTE — Telephone Encounter (Signed)
Per pt BP today is 175/63 HR 80, yesterday BP was 184/61 and HR 78.  She will start Toprol 25 mg a day and continue to monitor her HR and BP.  She will call back with any questions or concerns.

## 2013-10-02 DIAGNOSIS — R519 Headache, unspecified: Secondary | ICD-10-CM | POA: Insufficient documentation

## 2013-11-04 DIAGNOSIS — K117 Disturbances of salivary secretion: Secondary | ICD-10-CM | POA: Insufficient documentation

## 2013-11-12 ENCOUNTER — Encounter: Payer: Self-pay | Admitting: *Deleted

## 2013-11-19 ENCOUNTER — Ambulatory Visit (INDEPENDENT_AMBULATORY_CARE_PROVIDER_SITE_OTHER): Payer: Medicare Other | Admitting: Cardiology

## 2013-11-19 ENCOUNTER — Encounter: Payer: Self-pay | Admitting: *Deleted

## 2013-11-19 ENCOUNTER — Encounter: Payer: Self-pay | Admitting: Cardiology

## 2013-11-19 VITALS — BP 161/70 | HR 75 | Ht 61.0 in | Wt 109.0 lb

## 2013-11-19 DIAGNOSIS — G459 Transient cerebral ischemic attack, unspecified: Secondary | ICD-10-CM

## 2013-11-19 DIAGNOSIS — I359 Nonrheumatic aortic valve disorder, unspecified: Secondary | ICD-10-CM

## 2013-11-19 DIAGNOSIS — I6529 Occlusion and stenosis of unspecified carotid artery: Secondary | ICD-10-CM

## 2013-11-19 NOTE — Patient Instructions (Signed)
Please stop your Plavix. Continue all other medications as listed.  Your physician has requested that you have a carotid duplex. This test is an ultrasound of the carotid arteries in your neck. It looks at blood flow through these arteries that supply the brain with blood. Allow one hour for this exam. There are no restrictions or special instructions.  Follow up in 6 months with Dr Percival Spanish.  You will receive a letter in the mail 2 months before you are due.  Please call us when you receive this letter to schedule your follow up appointment.

## 2013-11-19 NOTE — Progress Notes (Signed)
HPI The patient presents for evaluation of episodes of slurred speech and some altered mental status. She has had an extensive work up.  Head CT was negative. Carotids demonstrate some mild obstruction. She had an echocardiogram which demonstrated some mild aortic stenosis but a well preserved ejection fraction. She did see a neurologist and I reviewed these notes. There was no clear etiology with a negative EEG. However, therapy with medications for possible seizure was apparently suggested and the patient declined.  She was previously seen by Dr. Rollene Fare.  He started her on Plavix last year for what was possibly a TIA the symptoms above. He has followed her for some mild aortic insufficiency and stenosis.  After our first visit together earlier this year she wore an event monitor that demonstrated no evidence of arrhythmia.    Since I last saw her she did go to [place] with an episode of presyncope.  However at that time she had had a high BP.  She then took 240 mg of Diovan and her BP fell.  She was taking a higher dose of basal ARB at that time.  She has since reduced the dose and is doing much better.  The patient denies any new symptoms such as chest discomfort, neck or arm discomfort. There has been no new shortness of breath, PND or orthopnea. There have been no reported palpitations, presyncope or syncope.  She has had no further spells.  She is bothered by easy bruising.    No Known Allergies  Current Outpatient Prescriptions  Medication Sig Dispense Refill  . ALPRAZolam (XANAX) 0.25 MG tablet Take 0.125 mg by mouth 2 (two) times daily as needed for anxiety.       Marland Kitchen aspirin EC 81 MG tablet Take 81 mg by mouth daily.      . clopidogrel (PLAVIX) 75 MG tablet Take 1 tablet by mouth daily.      . metoprolol succinate (TOPROL-XL) 25 MG 24 hr tablet Take 12.5 mg by mouth daily as needed.       . Multiple Vitamin (MULTIVITAMIN) capsule Take 1 capsule by mouth daily.      . pravastatin  (PRAVACHOL) 20 MG tablet Take 20 mg by mouth at bedtime.      . RABEprazole (ACIPHEX) 20 MG tablet Take 20 mg by mouth daily.      . valsartan (DIOVAN) 80 MG tablet Take 80 mg by mouth daily as needed.       No current facility-administered medications for this visit.    Past Medical History  Diagnosis Date  . Aortic regurgitation     Moderate  . Hyperlipidemia   . Hypertension   . Carotid stenosis     Past Surgical History  Procedure Laterality Date  . Cesarean section  1973  . Wrist surgery  2007    Right  . Cholecystectomy    . Cataract extraction       ROS:  Positive for decreased appetite.  Otherwise as stated in the HPI and negative for all other systems.  PHYSICAL EXAM BP 161/70  Pulse 75  Ht 5\' 1"  (1.549 m)  Wt 109 lb (49.442 kg)  BMI 20.61 kg/m2 GENERAL:  Well appearing NECK:  No jugular venous distention, waveform within normal limits, carotid upstroke brisk and symmetric, no bruits, no thyromegaly LUNGS:  Clear to auscultation bilaterally BACK:  No CVA tenderness CHEST:  Unremarkable HEART:  PMI not displaced or sustained,S1 and S2 within normal limits, no S3, no S4, no  clicks, no rubs, 2/6 apical systolic early peaking murmur without diastolic murmurs ABD:  Flat, positive bowel sounds normal in frequency in pitch, no bruits, no rebound, no guarding, no midline pulsatile mass, no hepatomegaly, no splenomegaly EXT:  2 plus pulses throughout, no edema, no cyanosis no clubbing   EKG:  Sinus rhythm, rate 69, axis within normal limits, intervals within normal limits, no acute ST-T wave changes. 11/19/2013  ASSESSMENT AND PLAN  DIZZINESS:  She feels better now that she has reduced her BP meds.   I agree that her symptoms were related to low BP.  I think that we should accept some higher blood pressures to avoid her hypotensive spells.  Of note, she would like to stop the Plavix.  I have reviewed the previous records and this was added with possible TIA.  However,  there was no clear evidence for this diagnosis.  I think that she can safely stop this medication.    AS:  The patient has mild aortic stenosis.  No further imaging is indicated and this can be followed clinically.  CAROTID STENOSIS:  She has had mild carotid disease and I will follow this up and this will be scheduled.  HTN:  Given the labile blood pressure I will not change her medications.

## 2013-11-26 ENCOUNTER — Telehealth: Payer: Self-pay | Admitting: Cardiology

## 2013-11-26 NOTE — Telephone Encounter (Signed)
New message  Pt called. States that she was recently seen at the [facility]. She states that Dr. Alean Rinne took her off of the toprolol medication because it was decreasing her heart rate. Pt requests a soon appt with Dr. Percival Spanish to discuss. Offered PA or NP, pt Declined. Made appt for 01/09/2014 at [facility] facility. Pt requests a call back to discuss medication changes and to receive a sooner appt. Please assist.

## 2013-11-27 NOTE — Telephone Encounter (Signed)
Followed up with pt - BP is better now that she is out of the hospital - today it was 108/46 HR 71.  She reports in was in the 70's when she went to the hospital.  Advised to remain off the Metoprolol as instructed, keep a record of her BP and HR and call back if problems.  She will keep her appt as scheduled unless we have a cancellation for Dr Percival Spanish sooner (she does not want to see a PA/NA).

## 2013-12-04 ENCOUNTER — Encounter (INDEPENDENT_AMBULATORY_CARE_PROVIDER_SITE_OTHER): Payer: Medicare Other

## 2013-12-04 ENCOUNTER — Other Ambulatory Visit (HOSPITAL_COMMUNITY): Payer: Self-pay

## 2013-12-04 DIAGNOSIS — I6529 Occlusion and stenosis of unspecified carotid artery: Secondary | ICD-10-CM

## 2013-12-05 ENCOUNTER — Ambulatory Visit (INDEPENDENT_AMBULATORY_CARE_PROVIDER_SITE_OTHER): Payer: Medicare Other | Admitting: Cardiovascular Disease

## 2013-12-05 ENCOUNTER — Encounter: Payer: Self-pay | Admitting: Cardiovascular Disease

## 2013-12-05 VITALS — BP 146/69 | HR 49 | Ht 61.0 in | Wt 107.0 lb

## 2013-12-05 DIAGNOSIS — I350 Nonrheumatic aortic (valve) stenosis: Secondary | ICD-10-CM | POA: Insufficient documentation

## 2013-12-05 DIAGNOSIS — Z87898 Personal history of other specified conditions: Secondary | ICD-10-CM

## 2013-12-05 DIAGNOSIS — I6529 Occlusion and stenosis of unspecified carotid artery: Secondary | ICD-10-CM

## 2013-12-05 DIAGNOSIS — I359 Nonrheumatic aortic valve disorder, unspecified: Secondary | ICD-10-CM

## 2013-12-05 DIAGNOSIS — I10 Essential (primary) hypertension: Secondary | ICD-10-CM

## 2013-12-05 DIAGNOSIS — Z9289 Personal history of other medical treatment: Secondary | ICD-10-CM

## 2013-12-05 DIAGNOSIS — I951 Orthostatic hypotension: Secondary | ICD-10-CM

## 2013-12-05 DIAGNOSIS — Z7182 Exercise counseling: Secondary | ICD-10-CM

## 2013-12-05 NOTE — Patient Instructions (Signed)
Continue all current medications. Your physician wants you to follow up in: 6 months.  You will receive a reminder letter in the mail one-two months in advance.  If you don't receive a letter, please call our office to schedule the follow up appointment   

## 2013-12-05 NOTE — Progress Notes (Signed)
Patient ID: Steele Berg, female   DOB: 03-17-1930, 80 y.o.   MRN: [id number]      SUBJECTIVE: The patient is an 80 year old woman with a history of aortic stenosis, hypertension, mild carotid artery disease, and a TIA. Most recent echocardiogram in June 2014 demonstrated very mild aortic stenosis with no evidence of aortic regurgitation. Left ventricular systolic function was normal. ECG dated 11/21/2013 demonstrated sinus bradycardia with a PAC, heart rate 50 beats per minute. She has been noted to have very labile blood pressure.  She was hospitalized in late May at [facility] for low blood pressure, which has been noted to be a recurrent problem. Her systolic reading was in the 70 mm mercury range. White count was 5.5, hemoglobin 10.9 which is stable, and platelets were normal. Sodium 132, potassium 5, chloride 97, bicarbonate 28, creatinine 0.82. Troponin was normal x3. TSH was also normal. She was given IV fluids. Her heart rate ranged from 40-75 beats per minute. Toprol-XL was stopped. She had orthostatic blood pressure in the hospital.  She has been monitoring her blood pressure 3 times daily. On some days, systolic readings are in the 70 mmHg range and she does not take her Diovan. On other days her blood pressure can be as high as 409-811 mmHg systolic, and then she will take Diovan 80 mg. When her blood pressure is low, she raises her feet above her heart and keeps her legs elevated. She drinks 2 and a half cups of coffee every morning. When her blood pressure is high, she feels a tingling sensation on the top of her head and feels very tired and fatigued for the next one to 2 days. She denies chest pain and exertional dyspnea. She likes to remain quite active.    No Known Allergies  Current Outpatient Prescriptions  Medication Sig Dispense Refill  . ALPRAZolam (XANAX) 0.25 MG tablet Take 0.125 mg by mouth 2 (two) times daily as needed for anxiety.       Marland Kitchen aspirin EC 81 MG  tablet Take 81 mg by mouth daily.      . metoprolol succinate (TOPROL-XL) 25 MG 24 hr tablet Take 12.5 mg by mouth daily as needed.       . Multiple Vitamin (MULTIVITAMIN) capsule Take 1 capsule by mouth daily.      . pravastatin (PRAVACHOL) 20 MG tablet Take 20 mg by mouth at bedtime.      . RABEprazole (ACIPHEX) 20 MG tablet Take 20 mg by mouth daily.      . valsartan (DIOVAN) 80 MG tablet Take 80 mg by mouth daily as needed.       No current facility-administered medications for this visit.    Past Medical History  Diagnosis Date  . Aortic regurgitation     Moderate  . Hyperlipidemia   . Hypertension   . Carotid stenosis     Past Surgical History  Procedure Laterality Date  . Cesarean section  1973  . Wrist surgery  2007    Right  . Cholecystectomy    . Cataract extraction      History   Social History  . Marital Status: Widowed    Spouse Name: N/A    Number of Children: 1  . Years of Education: N/A   Occupational History  . Not on file.   Social History Main Topics  . Smoking status: Never Smoker   . Smokeless tobacco: Not on file     Comment: Tobacco use-no  .  Alcohol Use: No  . Drug Use: No  . Sexual Activity: Not on file   Other Topics Concern  . Not on file   Social History Narrative   No regular exercise. Her son lives with her.  She is hear with her sister and care giver.     BP 146/69  Pulse 49   PHYSICAL EXAM General: NAD Neck: No JVD, no thyromegaly. Lungs: Clear to auscultation bilaterally with normal respiratory effort. CV: Nondisplaced PMI.  Regular rate and rhythm, normal S1/S2, no S3/S4, soft 1/6 ejection systolic murmur over RUSB. No pretibial or periankle edema.  No carotid bruit.  Normal pedal pulses.  Abdomen: Soft, nontender, no hepatosplenomegaly, no distention.  Neurologic: Alert and oriented x 3.  Psych: Normal affect. Extremities: No clubbing or cyanosis.   ECG: reviewed and available in electronic  records.      ASSESSMENT AND PLAN: 1. Labile HTN: We had a very lengthy discussion regarding this. I instructed her to take Diovan 80 mg when systolic readings approach the 155-160 range, in order to avoid extremely high readings which cause her significant fatigue. I do not want to use medications like midodrine in the event of hypotension, given her propensity for high blood pressures. I have taught her isotonic exercises such as squatting and crossing her legs as well as wheezing her fists for 30 seconds in order to stimulate high blood pressure. I have asked her to continue checking her blood pressure 3 times a day. 2. Aortic stenosis: Very mild by last echo. Will monitor clinically and surveillance echocardiography as needed. 3. Carotid artery disease: Surveillance imaging as needed.  Dispo: f/u 6 months.  Time spent: 40 minutes.  Kate Sable, M.D., F.A.C.C.

## 2013-12-26 ENCOUNTER — Encounter: Payer: Self-pay | Admitting: Cardiovascular Disease

## 2014-01-05 DIAGNOSIS — E782 Mixed hyperlipidemia: Secondary | ICD-10-CM | POA: Insufficient documentation

## 2014-01-05 DIAGNOSIS — I10 Essential (primary) hypertension: Secondary | ICD-10-CM | POA: Insufficient documentation

## 2014-01-06 ENCOUNTER — Ambulatory Visit: Payer: Medicare Other | Admitting: Neurology

## 2014-01-09 ENCOUNTER — Ambulatory Visit: Payer: Medicare Other | Admitting: Cardiology

## 2014-01-12 ENCOUNTER — Other Ambulatory Visit: Payer: Self-pay | Admitting: *Deleted

## 2014-01-12 MED ORDER — VALSARTAN 80 MG PO TABS: 80.0000 mg | ORAL_TABLET | Freq: Every day | ORAL | Status: DC | PRN

## 2014-01-12 NOTE — Telephone Encounter (Signed)
Rx refill sent to patient pharmacy   

## 2014-01-14 DIAGNOSIS — R944 Abnormal results of kidney function studies: Secondary | ICD-10-CM | POA: Insufficient documentation

## 2014-02-06 DIAGNOSIS — J3489 Other specified disorders of nose and nasal sinuses: Secondary | ICD-10-CM | POA: Insufficient documentation

## 2014-02-19 ENCOUNTER — Telehealth (HOSPITAL_COMMUNITY): Payer: Self-pay | Admitting: Dietician

## 2014-02-19 NOTE — Telephone Encounter (Signed)
Received referral from [facility] for hyperlipidemia. Provided AND Nutrition Care Manual's "Heart Healthy Eating Nutrition Therapy" handout.

## 2014-02-24 DIAGNOSIS — E875 Hyperkalemia: Secondary | ICD-10-CM | POA: Insufficient documentation

## 2014-02-24 DIAGNOSIS — E871 Hypo-osmolality and hyponatremia: Secondary | ICD-10-CM | POA: Insufficient documentation

## 2014-02-24 DIAGNOSIS — M1990 Unspecified osteoarthritis, unspecified site: Secondary | ICD-10-CM | POA: Insufficient documentation

## 2014-04-16 ENCOUNTER — Encounter: Payer: Self-pay | Admitting: Cardiovascular Disease

## 2014-05-26 ENCOUNTER — Ambulatory Visit: Payer: Medicare Other | Admitting: Cardiovascular Disease

## 2014-05-28 ENCOUNTER — Encounter: Payer: Self-pay | Admitting: Cardiovascular Disease

## 2014-05-28 ENCOUNTER — Ambulatory Visit (INDEPENDENT_AMBULATORY_CARE_PROVIDER_SITE_OTHER): Payer: Medicare Other | Admitting: Cardiovascular Disease

## 2014-05-28 VITALS — BP 191/78 | HR 72 | Ht 60.0 in | Wt 113.0 lb

## 2014-05-28 DIAGNOSIS — I350 Nonrheumatic aortic (valve) stenosis: Secondary | ICD-10-CM

## 2014-05-28 DIAGNOSIS — I10 Essential (primary) hypertension: Secondary | ICD-10-CM

## 2014-05-28 DIAGNOSIS — I951 Orthostatic hypotension: Secondary | ICD-10-CM

## 2014-05-28 DIAGNOSIS — I6529 Occlusion and stenosis of unspecified carotid artery: Secondary | ICD-10-CM

## 2014-05-28 MED ORDER — VALSARTAN 160 MG PO TABS: 160.0000 mg | ORAL_TABLET | Freq: Every day | ORAL | Status: DC | PRN

## 2014-05-28 MED ORDER — VALSARTAN 80 MG PO TABS: 120.0000 mg | ORAL_TABLET | Freq: Every day | ORAL | Status: DC | PRN

## 2014-05-28 NOTE — Patient Instructions (Addendum)
Your physician recommends that you schedule a follow-up appointment in: 3-4 months. You will receive a reminder letter in the mail in about 2 months reminding you to call and schedule your appointment. If you don't receive this letter, please contact our office. Your physician has recommended you make the following change in your medication:  Increase your diovan to 120 mg daily as needed. Please take 1 &1/2 of your 80 mg daily as needed or you may take (1) 80 mg plus 40 mg daily as needed. You may continue to use the diovan 40 mg as needed as well. Continue all other medications the same.

## 2014-05-28 NOTE — Progress Notes (Signed)
Patient ID: Kathryn Gates, female   DOB: 05-Mar-1930, 80 y.o.   MRN: [id number]      SUBJECTIVE: The patient is an 80 year old woman with a history of aortic stenosis, hypertension, mild carotid artery disease, and a TIA. Most recent echocardiogram in June 2014 demonstrated very mild aortic stenosis with no evidence of aortic regurgitation. Left ventricular systolic function was normal. She continues to struggle with labile blood pressure readings. Today systolic readings have been in the 190 range , but they dropped to as low as 70. She denies chest pain. She does get short of breath when her BP is high, and also experiences a tingling sensation in her head along with headaches at times.  Review of Systems: As per "subjective", otherwise negative.  No Known Allergies  Current Outpatient Prescriptions  Medication Sig Dispense Refill  . aspirin EC 81 MG tablet Take 81 mg by mouth daily.    . Multiple Vitamin (MULTIVITAMIN) capsule Take 1 capsule by mouth daily.    . pravastatin (PRAVACHOL) 20 MG tablet Take 20 mg by mouth at bedtime.    . RABEprazole (ACIPHEX) 20 MG tablet Take 20 mg by mouth daily.    . valsartan (DIOVAN) 80 MG tablet Take 1 tablet (80 mg total) by mouth daily as needed. 30 tablet 5   No current facility-administered medications for this visit.    Past Medical History  Diagnosis Date  . Aortic regurgitation     Moderate  . Hyperlipidemia   . Hypertension   . Carotid stenosis     Past Surgical History  Procedure Laterality Date  . Cesarean section  1973  . Wrist surgery  2007    Right  . Cholecystectomy    . Cataract extraction      History   Social History  . Marital Status: Widowed    Spouse Name: N/A    Number of Children: 1  . Years of Education: N/A   Occupational History  . Not on file.   Social History Main Topics  . Smoking status: Never Smoker   . Smokeless tobacco: Never Used     Comment: Tobacco use-no  . Alcohol Use: No  . Drug Use:  No  . Sexual Activity: Not on file   Other Topics Concern  . Not on file   Social History Narrative   No regular exercise. Her son lives with her.  She is hear with her sister and care giver.      Filed Vitals:   05/28/14 1346  Height: 5' (1.524 m)  Weight: 113 lb (51.256 kg)   BP 192/80, 191/78  Pulse 72, 72  SpO2 100%   PHYSICAL EXAM General: NAD Neck: No JVD, no thyromegaly. Lungs: Clear to auscultation bilaterally with normal respiratory effort. CV: Nondisplaced PMI. Regular rate and rhythm, normal S1/S2, no S3/S4, soft 1/6 ejection systolic murmur over RUSB. No pretibial or periankle edema. No carotid bruit. Normal pedal pulses.  Abdomen: Soft, nontender, no hepatosplenomegaly, no distention.  Neurologic: Alert and oriented x 3.  Psych: Normal affect. Extremities: No clubbing or cyanosis.  Skin: Normal. Musculoskeletal: Normal range of motion, no gross deformities. Extremities: No clubbing or cyanosis.   ECG: Most recent ECG reviewed.      ASSESSMENT AND PLAN: 1. Labile HTN: Markedly elevated today. I have instructed her to take Diovan 863 mg when systolic readings approach the 155-160 range, in order to avoid extremely high readings which cause her significant fatigue. She also has an extra 40 mg  which she can take in the afternoons/evenings if BP remains high. Today, I have asked her to take an extra 40 mg as soon as possible.  I do not want to use medications like midodrine in the event of hypotension, given her propensity for high blood pressures. I previously taught her isotonic exercises such as squatting and crossing her legs as well as wheezing her fists for 30 seconds in order to stimulate high blood pressure. 2. Aortic stenosis: Very mild by last echo. Will monitor clinically and surveillance echocardiography as needed. 3. Carotid artery disease: Surveillance imaging as needed.  Dispo: f/u 3-4 months.   Kathryn Gates, M.D., F.A.C.C.

## 2014-09-04 ENCOUNTER — Ambulatory Visit (INDEPENDENT_AMBULATORY_CARE_PROVIDER_SITE_OTHER): Payer: Medicare Other | Admitting: Cardiovascular Disease

## 2014-09-04 ENCOUNTER — Encounter: Payer: Self-pay | Admitting: Cardiovascular Disease

## 2014-09-04 VITALS — BP 182/72 | HR 74 | Ht 61.0 in | Wt 111.0 lb

## 2014-09-04 DIAGNOSIS — I10 Essential (primary) hypertension: Secondary | ICD-10-CM | POA: Diagnosis not present

## 2014-09-04 DIAGNOSIS — I6529 Occlusion and stenosis of unspecified carotid artery: Secondary | ICD-10-CM

## 2014-09-04 DIAGNOSIS — R0989 Other specified symptoms and signs involving the circulatory and respiratory systems: Secondary | ICD-10-CM

## 2014-09-04 DIAGNOSIS — I350 Nonrheumatic aortic (valve) stenosis: Secondary | ICD-10-CM | POA: Diagnosis not present

## 2014-09-04 MED ORDER — VALSARTAN 40 MG PO TABS: 40.0000 mg | ORAL_TABLET | Freq: Every day | ORAL | Status: DC | PRN

## 2014-09-04 NOTE — Patient Instructions (Signed)
Your physician recommends that you schedule a follow-up appointment in: 6 months. You will receive a reminder letter in the mail in about 4 months reminding you to call and schedule your appointment. If you don't receive this letter, please contact our office. Your physician has recommended you make the following change in your medication:  Take diovan 40 mg daily only if your top blood pressure number is 160 or higher. Continue all other medications the same.

## 2014-09-04 NOTE — Progress Notes (Signed)
Patient ID: Kathryn Gates, female   DOB: 06/12/1930, 81 y.o.   MRN: [id number]      SUBJECTIVE: The patient is an 81 year old woman with a history of aortic stenosis, labile hypertension, generalized anxiety disorder, mild carotid artery disease, and a TIA. Most recent echocardiogram in June 2014 demonstrated very mild aortic stenosis with no evidence of aortic regurgitation. Left ventricular systolic function was normal.  She continues to struggle with fluctuating blood pressures. Whenever she has a migraine headache, her blood pressure drops and has gotten down to 84/42 and 75/43 both with a heart rate of 68 bpm. She denies chest pain and shortness of breath. She says, "I stay anxious all the time". She is now on doxepin which helps her sleep and reduce anxiety levels. Her blood pressures on certain evenings have become as high as 831-517 systolic.   Review of Systems: As per "subjective", otherwise negative.  No Known Allergies  Current Outpatient Prescriptions  Medication Sig Dispense Refill  . aspirin EC 81 MG tablet Take 81 mg by mouth daily.    Marland Kitchen doxepin (SINEQUAN) 10 MG capsule Take 10 mg by mouth at bedtime.    . metoprolol tartrate (LOPRESSOR) 25 MG tablet Take 12.5 mg by mouth 2 (two) times daily.    . Multiple Vitamin (MULTIVITAMIN) capsule Take 1 capsule by mouth daily.    . pravastatin (PRAVACHOL) 20 MG tablet Take 20 mg by mouth at bedtime.    . RABEprazole (ACIPHEX) 20 MG tablet Take 20 mg by mouth daily.    . valsartan (DIOVAN) 40 MG tablet Take 40 mg by mouth daily as needed. Take 1 tablet by mouth once daily if needed if top number of blood pressure is greater than 175    . valsartan (DIOVAN) 80 MG tablet Take 1.5 tablets (120 mg total) by mouth daily as needed. 45 tablet 3   No current facility-administered medications for this visit.    Past Medical History  Diagnosis Date  . Aortic regurgitation     Moderate  . Hyperlipidemia   . Hypertension   . Carotid  stenosis     Past Surgical History  Procedure Laterality Date  . Cesarean section  1973  . Wrist surgery  2007    Right  . Cholecystectomy    . Cataract extraction      History   Social History  . Marital Status: Widowed    Spouse Name: N/A  . Number of Children: 1  . Years of Education: N/A   Occupational History  . Not on file.   Social History Main Topics  . Smoking status: Never Smoker   . Smokeless tobacco: Never Used     Comment: Tobacco use-no  . Alcohol Use: No  . Drug Use: No  . Sexual Activity: Not on file   Other Topics Concern  . Not on file   Social History Narrative   No regular exercise. Her son lives with her.  She is hear with her sister and care giver.      Filed Vitals:   09/04/14 1323 09/04/14 1332  BP: 168/68 182/72  Pulse: 74   Height: 5\' 1"  (1.549 m)   Weight: 111 lb (50.349 kg)   SpO2: 100%     PHYSICAL EXAM General: NAD Neck: No JVD, no thyromegaly. Lungs: Clear to auscultation bilaterally with normal respiratory effort. CV: Nondisplaced PMI. Regular rate and rhythm, normal S1/S2, no S3/S4, soft 1/6 ejection systolic murmur over RUSB. No pretibial or periankle edema.  No carotid bruit. Normal pedal pulses.  Abdomen: Soft, nontender, no hepatosplenomegaly, no distention.  Neurologic: Alert and oriented x 3.  Psych: Normal affect. Extremities: No clubbing or cyanosis.  Skin: Normal. Musculoskeletal: Normal range of motion, no gross deformities. Extremities: No clubbing or cyanosis.   ECG: Most recent ECG reviewed.      ASSESSMENT AND PLAN: 1. Labile HTN: Remains elevated today. I have instructed her to take Diovan 40 mg when systolic readings are >/= 160 mmHg, in order to avoid extremely high readings which cause her significant fatigue. I do not want to use medications like midodrine in the event of hypotension, given her propensity for high blood pressures. I previously taught her isotonic exercises such as squatting  and crossing her legs as well as wheezing her fists for 30 seconds in order to stimulate high blood pressure. 2. Aortic stenosis: Very mild by last echo. Will monitor clinically and surveillance echocardiography as needed. 3. Carotid artery disease: Surveillance imaging as needed.  Dispo: f/u 6 months.   Kate Sable, M.D., F.A.C.C.

## 2014-10-07 DIAGNOSIS — R209 Unspecified disturbances of skin sensation: Secondary | ICD-10-CM | POA: Insufficient documentation

## 2014-10-08 ENCOUNTER — Encounter: Payer: Self-pay | Admitting: Physician Assistant

## 2014-10-08 ENCOUNTER — Telehealth: Payer: Self-pay | Admitting: *Deleted

## 2014-10-08 ENCOUNTER — Ambulatory Visit (INDEPENDENT_AMBULATORY_CARE_PROVIDER_SITE_OTHER): Payer: Medicare Other | Admitting: Physician Assistant

## 2014-10-08 VITALS — BP 146/68 | HR 42 | Ht 61.0 in | Wt 112.0 lb

## 2014-10-08 DIAGNOSIS — I10 Essential (primary) hypertension: Secondary | ICD-10-CM

## 2014-10-08 DIAGNOSIS — D333 Benign neoplasm of cranial nerves: Secondary | ICD-10-CM

## 2014-10-08 DIAGNOSIS — R51 Headache: Secondary | ICD-10-CM

## 2014-10-08 DIAGNOSIS — I350 Nonrheumatic aortic (valve) stenosis: Secondary | ICD-10-CM | POA: Diagnosis not present

## 2014-10-08 DIAGNOSIS — E785 Hyperlipidemia, unspecified: Secondary | ICD-10-CM | POA: Insufficient documentation

## 2014-10-08 DIAGNOSIS — R519 Headache, unspecified: Secondary | ICD-10-CM

## 2014-10-08 DIAGNOSIS — F411 Generalized anxiety disorder: Secondary | ICD-10-CM | POA: Insufficient documentation

## 2014-10-08 DIAGNOSIS — I6529 Occlusion and stenosis of unspecified carotid artery: Secondary | ICD-10-CM

## 2014-10-08 DIAGNOSIS — R001 Bradycardia, unspecified: Secondary | ICD-10-CM | POA: Insufficient documentation

## 2014-10-08 DIAGNOSIS — R0989 Other specified symptoms and signs involving the circulatory and respiratory systems: Secondary | ICD-10-CM

## 2014-10-08 NOTE — Addendum Note (Signed)
Addended by: Barbarann Ehlers A on: 10/08/2014 05:02 PM   Modules accepted: Orders

## 2014-10-08 NOTE — Progress Notes (Addendum)
Cardiology Office Note Date:  10/08/2014  Patient ID:  Kathryn Gates, Kathryn Gates 06-12-1930, MRN [id number] PCP:  Curlene Labrum, MD  Cardiologist:  Bronson Ing  Chief Complaint: here for bradycardia  History of Present Illness: Kathryn Gates is a 81 y.o. female with history of mild aortic stenosis, labile HTN, generalized anxiety disorder, mild carotid disease (1-39% BICA in 11/2013), possible prior TIA, acoustic neuroma, hyponatremia, & headaches who presents for evaluation of bradycardia. Although 2D echo in 2013 suggested moderate aortic regurgitation, most recent 2D echo 11/2012 showed EF 55-60%, grade 1 DD, mild AS without mention of regurgitation, RV systolic pressure increased, PASP 47mmHg. She has no known history of CAD.  OV last month with Dr. Bronson Ing was notable for patient-reported hypotension down to 75/43 during episodes of headaches, and blood pressure in the evening as high as 190-200. Per review of PCP's note, this issue of labile blood pressures during headaches has been going on some time, also coinciding with some parasthesias. She wore a 21-day event monitor in 07/2013 which did pick up rare autotrigger of sinus bradycardia (HR 38 at the lowest, 41 on another episode), but the patient had not indicated any symptoms during the monitoring period as they were infrequent at that time. She saw neurology and was on amitriptyline but this was discontinued due to side effects (dry mouth, hallucinations, tachycardia). She was also on Neurontin at one point but this was stopped due to ineffectiveness. Depakote was recommended but the patient didn't want to take it. Doxepin was later added. At one point she was tried on metoprolol. More recently in 08/2014, she was seen at [facility] ED for headache and bradycardia. Metoprolol was stopped. Although I do not have access to formal records during this visit, she brought in her ER summary which reported a HR of 50. Labs showed WBC 8.2, Hgb 11.4, Hcg  33.6, Plt 289, troponin neg x1, Na 130, K 4.7, CO2 26, Cl 97, BUN 17, Cr 0.98, glucose 169. Calcium 10.4 at Dr. Lizbeth Bark office in 06/2014. Last TSH available was 1.39 in 10/2013.  She was seen at her PCP's office for routine visit today and HR was noted to be 46. Our office was contacted to facilitate a follow-up visit. Upon arrival to our office, resting HR was 42 and she was completely asymptomatic with this. With ambulation, HR went to high 70s. She denies any recent chest pain, SOB, exertional symptoms, dizziness, palpitations, pre-syncope or syncope. She does say that she feels "weak" during the times when her blood pressure drops during the headache episodes. She keeps a meticulous log of her vitals and it appears her HR is always 50s-70s during these episodes. This is happening about every 2 days or so. She is accompanied by her sister who takes care of her.  Past Medical History  Diagnosis Date  . Aortic stenosis     a. 2D Echo 11/2013: mild AS.  Marland Kitchen Hyperlipidemia   . Hypertension     a. Labile BP.  Marland Kitchen Carotid stenosis     a. Duplex 1-39% BICA in 11/2013.  Marland Kitchen Aortic regurgitation     a. Seen on 2D echo in 2013 (moderate) but not mentioned in 11/2013 echo.  . Acoustic neuroma   . Generalized anxiety disorder   . TIA (transient ischemic attack)   . Headache     a. Possible tension HA versus migraine variant.  . Hyponatremia     Past Surgical History  Procedure Laterality Date  . Cesarean section  1973  . Wrist surgery  2007    Right  . Cholecystectomy    . Cataract extraction      Current Outpatient Prescriptions  Medication Sig Dispense Refill  . aspirin EC 81 MG tablet Take 81 mg by mouth daily.    Marland Kitchen doxepin (SINEQUAN) 10 MG capsule Take 10 mg by mouth at bedtime.    . RABEprazole (ACIPHEX) 20 MG tablet Take 20 mg by mouth daily.    . valsartan (DIOVAN) 40 MG tablet Take 1 tablet (40 mg total) by mouth daily as needed. Take 1 tablet by mouth once daily if needed if top number  of blood pressure is greater than 160 30 tablet 3  . valsartan (DIOVAN) 80 MG tablet Take 80 mg by mouth daily.    . Multiple Vitamin (MULTIVITAMIN) capsule Take 1 capsule by mouth daily.    . pravastatin (PRAVACHOL) 20 MG tablet Take 20 mg by mouth at bedtime.     No current facility-administered medications for this visit.    Allergies:   Review of patient's allergies indicates no known allergies.   Social History:  The patient  reports that she has never smoked. She has never used smokeless tobacco. She reports that she does not drink alcohol or use illicit drugs.   Family History:  The patient's family history includes Heart failure in her other; Stroke in her other.  ROS:  Please see the history of present illness. All other systems are reviewed and otherwise negative.   PHYSICAL EXAM:  VS:  BP 146/68 mmHg  Pulse 42  Ht 5\' 1"  (1.549 m)  Wt 112 lb (50.803 kg)  BMI 21.17 kg/m2  SpO2 99% BMI: Body mass index is 21.17 kg/(m^2). Well nourished, well developed WF, in no acute distress HEENT: normocephalic, atraumatic Neck: no JVD Cardiac:  normal S1, S2; bradycardic but regular; soft SEM at RUSB Lungs:  clear to auscultation bilaterally, no wheezing, rhonchi or rales Abd: soft, nontender, no hepatomegaly Ext: no edema Skin: warm and dry Neuro:  moves all extremities spontaneously, no focal abnormalities noted  EKG:  Sinus bradycardia 42bpm with sinus arrhythmia, nonspecific ST-T changes similar to prior tracings. PR 186, QRS 92, QTc 402.  Recent Labs: See above.  Wt Readings from Last 3 Encounters:  10/08/14 112 lb (50.803 kg)  09/04/14 111 lb (50.349 kg)  05/28/14 113 lb (51.256 kg)     Other studies reviewed: Additional studies/records reviewed today include: summarized above  ASSESSMENT AND PLAN:  1. Sinus bradycardia - baseline HR per review of prior EKGs is in the low 50s. This may possibly be a chronic issue given the event monitor she had back in 07/2013  demonstrated HR transiently dipping into the upper 30s/40s. At the time that she wore this, her episodes of headache/hypotension were less frequent and she did not experience one of these during the monitoring period. It would be helpful to see what her HR does right before one of these "attacks" happens. We need to exclude a more significant bradycardia causing her to have hypotension and subsequent headache. I have recommended a 30-day event monitor but the patient feels her episodes are now frequent enough that we would pick one up in a 14-day study. I stressed the importance of activating the "symptom button" when she is having one of her episodes. Will also recheck labs/electrolytes today along with thyroid function. She appears to have in-tact chronotropic competence with HR raising to the high 70s with ambulation and no limiting symptoms  with exertion. Should her bradycardia begin correlating with symptoms, she will need to be referred to EP. 2. Labile hypertension, particularly with hypotension in the setting of headaches - see above. This has previously been felt to be neurologically-mediated but we should exclude episodic worsening of bradycardia. I also told her it may be worthwhile to bring her BP cuff with her to the next visit to make sure it is calibrated and operating correctly. 3. Mildly elevated calcium level on labs 06/2014 - as above, recheck labs today. 4. Mild aortic stenosis - will continue to follow clinically. The above issues have preceded the most recent echocardiogram so doubt this is contributing at this time.  5. History of acoustic neuroma - patient plans to follow up with ENT as previously recommended by PCP. Not clear if this is playing any role in her symptoms.  Disposition: F/u with Dr. Bronson Ing in 2 months for recheck.   Current medicines are reviewed at length with the patient today.  The patient did not have any concerns regarding medicines.  Signed, Melina Copa  PA-C 10/08/2014 2:28 PM     [place] Office Phone: [phone]

## 2014-10-08 NOTE — Patient Instructions (Signed)
Your physician recommends that you schedule a follow-up appointment in: 2 months with [facility]    Your physician recommends that you continue on your current medications as directed. Please refer to the Current Medication list given to you today.    Your physician has recommended that you wear an event monitor for 14 days. Event monitors are medical devices that record the heart's electrical activity. Doctors most often [place] these monitors to diagnose arrhythmias. Arrhythmias are problems with the speed or rhythm of the heartbeat. The monitor is a small, portable device. You can wear one while you do your normal daily activities. This is usually used to diagnose what is causing palpitations/syncope (passing out).      Your physician recommends that you return for lab work in: CBC, CMET,TSH,Free T4       Thank you for choosing [facility] !

## 2014-10-08 NOTE — Telephone Encounter (Signed)
Received call from Dr. Pleas Koch this morning.  Patient still having issue with low heart rate even after Metoprolol being stopped.  Did EKG while there in his office this morning & heart rate was 46.  PMD is concerned that she may have some conduction issues & would like her seen soon.    Appointment scheduled for this afternoon at 1:30 with Melina Copa, PA in our [employer] office.  Patient aware.  Requested Dr. Pleas Koch office to fax notes to [place] office.

## 2014-10-09 LAB — COMPREHENSIVE METABOLIC PANEL
ALBUMIN: 4.2 g/dL (ref 3.5–5.2)
ALK PHOS: 74 U/L (ref 39–117)
AST: 14 U/L (ref 0–37)
BUN: 17 mg/dL (ref 6–23)
CALCIUM: 9.2 mg/dL (ref 8.4–10.5)
CHLORIDE: 100 meq/L (ref 96–112)
CO2: 24 mEq/L (ref 19–32)
Creat: 1.18 mg/dL — ABNORMAL HIGH (ref 0.50–1.10)
Glucose, Bld: 99 mg/dL (ref 70–99)
POTASSIUM: 4.7 meq/L (ref 3.5–5.3)
Sodium: 134 mEq/L — ABNORMAL LOW (ref 135–145)
Total Bilirubin: 0.5 mg/dL (ref 0.2–1.2)
Total Protein: 6.7 g/dL (ref 6.0–8.3)

## 2014-10-09 LAB — CBC
HEMATOCRIT: 32.3 % — AB (ref 36.0–46.0)
HEMOGLOBIN: 10.7 g/dL — AB (ref 12.0–15.0)
MCH: 28.9 pg (ref 26.0–34.0)
MCHC: 33.1 g/dL (ref 30.0–36.0)
MCV: 87.3 fL (ref 78.0–100.0)
MPV: 9.7 fL (ref 8.6–12.4)
Platelets: 269 10*3/uL (ref 150–400)
RBC: 3.7 MIL/uL — ABNORMAL LOW (ref 3.87–5.11)
RDW: 14.1 % (ref 11.5–15.5)
WBC: 6.4 10*3/uL (ref 4.0–10.5)

## 2014-10-09 LAB — TSH: TSH: 2.524 u[IU]/mL (ref 0.350–4.500)

## 2014-10-09 LAB — T4, FREE: Free T4: 0.97 ng/dL (ref 0.80–1.80)

## 2014-10-29 NOTE — Addendum Note (Signed)
Addended by: Levonne Hubert on: 10/29/2014 04:24 PM   Modules accepted: Orders

## 2014-11-03 ENCOUNTER — Telehealth: Payer: Self-pay | Admitting: Cardiovascular Disease

## 2014-11-03 NOTE — Telephone Encounter (Signed)
Patient would like to know the results of her heart monitor adn Dr Pleas Koch asked her to have the latest lab work sent to him

## 2014-11-05 NOTE — Telephone Encounter (Signed)
Reminded to keep follow up with Dr. Bronson Ing for 12/14/2014 here in [place] office.

## 2014-11-05 NOTE — Telephone Encounter (Signed)
Notes Recorded by Laurine Blazer, LPN on 8/52/7782 at 4:23 AM Patient notified. Stated that she did have episode of migrain / low blood pressure while wearing the monitor. Reassured patient that heart rate was normal during monitor time frame and that she should continue to follow with neurology.  Notes Recorded by Charlie Pitter, PA-C on 11/04/2014 at 4:00 PM FYI - The event monitor was generally unremarkable - only showed 1 episode of HR 47, which was an auto-trigger. Her HR was lower than this in clinic when she was seen so this was not a new finding. I would like to know if she had an episode of migraine headache/low blood pressure while wearing the monitor. She said this was happening every other day. If she did have one of these episodes while wearing the monitor, then fortunately her HR was normal during these episodes and she should continue to follow with neurology. I sent this to both of you Edd Fabian, I sent this to you since you sent me a staff message about her, and Barnetta Chapel, I sent you this because you helped me with this patient in clinic that day. Not sure if my original result note went anywhere. Whoever can help, thanks!! Melina Copa PA-C   Notes Recorded by Charlie Pitter, PA-C on 11/04/2014 at 3:54 PM Can you find out if the patient had any of her migraine attack/low blood pressure events while wearing the monitor? Thanks. Dayna Dunn PA-C Notes Recorded by Charlie Pitter, PA-C on 10/30/2014 at 1:48 PM Can you find out if the patient had any of her migraine attack/low blood pressure events while wearing the monitor? Thanks. Dayna Dunn PA-C

## 2014-11-05 NOTE — Telephone Encounter (Signed)
Also, reviewed lab results with her again at her request.  Informed patient that results were forwarded on 10/13/2014 to PMD.

## 2014-12-14 ENCOUNTER — Encounter: Payer: Self-pay | Admitting: *Deleted

## 2014-12-14 ENCOUNTER — Ambulatory Visit (INDEPENDENT_AMBULATORY_CARE_PROVIDER_SITE_OTHER): Payer: Medicare Other | Admitting: Cardiovascular Disease

## 2014-12-14 ENCOUNTER — Encounter: Payer: Self-pay | Admitting: Cardiovascular Disease

## 2014-12-14 VITALS — BP 64/40 | HR 58 | Ht 61.0 in | Wt 104.0 lb

## 2014-12-14 DIAGNOSIS — I10 Essential (primary) hypertension: Secondary | ICD-10-CM

## 2014-12-14 DIAGNOSIS — I6529 Occlusion and stenosis of unspecified carotid artery: Secondary | ICD-10-CM

## 2014-12-14 DIAGNOSIS — R001 Bradycardia, unspecified: Secondary | ICD-10-CM | POA: Diagnosis not present

## 2014-12-14 DIAGNOSIS — I9589 Other hypotension: Secondary | ICD-10-CM

## 2014-12-14 DIAGNOSIS — R0989 Other specified symptoms and signs involving the circulatory and respiratory systems: Secondary | ICD-10-CM

## 2014-12-14 DIAGNOSIS — F5089 Other specified eating disorder: Secondary | ICD-10-CM | POA: Insufficient documentation

## 2014-12-14 DIAGNOSIS — I158 Other secondary hypertension: Secondary | ICD-10-CM

## 2014-12-14 DIAGNOSIS — Z719 Counseling, unspecified: Secondary | ICD-10-CM

## 2014-12-14 DIAGNOSIS — Z713 Dietary counseling and surveillance: Secondary | ICD-10-CM

## 2014-12-14 DIAGNOSIS — Z7182 Exercise counseling: Secondary | ICD-10-CM

## 2014-12-14 DIAGNOSIS — I350 Nonrheumatic aortic (valve) stenosis: Secondary | ICD-10-CM

## 2014-12-14 NOTE — Patient Instructions (Signed)
   Referral to [facility] for labile hypertension / neurologically mediated.   Compression stockings - order given today. Continue all current medications. Your physician wants you to follow up in:  1 year.  You will receive a reminder letter in the mail one-two months in advance.  If you don't receive a letter, please call our office to schedule the follow up appointment

## 2014-12-14 NOTE — Progress Notes (Signed)
Patient ID: Steele Berg, female   DOB: [birth year], 81 y.o.   MRN: [id number]      SUBJECTIVE: The patient is an 81 year old woman with a history of aortic stenosis, labile hypertension deemed neurologically mediated, generalized anxiety disorder, mild carotid artery disease, and a TIA. Most recent echocardiogram in June 2014 demonstrated very mild aortic stenosis with no evidence of aortic regurgitation. Left ventricular systolic function was normal.  She followed up with Melina Copa PA-C after seeing me. 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 Thyroid blood tests were normal on 10/08/14. Hgb 10.7.   She is hypotensive today. These episodes of hypotension may last 2 days before she recovers. She tries eating salty foods and reclines with her feet elevated. She does not take Diovan on these days. However, she has had systolic readings as high as 171 and 200 mmHg at home. She then takes her Diovan. She has a hoarseness of voice and slurred speech when her blood pressure gets low in addition to feeling weak and tired. Her appetite is also diminished on these days.   Review of Systems: As per "subjective", otherwise negative.  No Known Allergies  Current Outpatient Prescriptions  Medication Sig Dispense Refill  . aspirin EC 81 MG tablet Take 81 mg by mouth daily.    Marland Kitchen doxepin (SINEQUAN) 10 MG capsule Take 10 mg by mouth at bedtime.    . Multiple Vitamin (MULTIVITAMIN) capsule Take 1 capsule by mouth daily.    . ondansetron (ZOFRAN) 4 MG tablet Take 4 mg by mouth every 6 (six) hours as needed for nausea or vomiting.    . pravastatin (PRAVACHOL) 20 MG tablet Take 20 mg by mouth at bedtime.    . RABEprazole (ACIPHEX) 20 MG tablet Take 20 mg by mouth daily.    . valsartan (DIOVAN) 40 MG tablet Take 1 tablet (40 mg total) by mouth daily as needed. Take 1 tablet by mouth once daily if needed if  top number of blood pressure is greater than 160 30 tablet 3  . valsartan (DIOVAN) 80 MG tablet Take 80 mg by mouth daily.     No current facility-administered medications for this visit.    Past Medical History  Diagnosis Date  . Aortic stenosis     a. 2D Echo 11/2013: mild AS.  Marland Kitchen Hyperlipidemia   . Hypertension     a. Labile BP.  Marland Kitchen Carotid stenosis     a. Duplex 1-39% BICA in 11/2013.  Marland Kitchen Aortic regurgitation     a. Seen on 2D echo in 2013 (moderate) but not mentioned in 11/2013 echo.  . Acoustic neuroma   . Generalized anxiety disorder   . TIA (transient ischemic attack)   . Headache     a. Possible tension HA versus migraine variant.  . Hyponatremia     Past Surgical History  Procedure Laterality Date  . Cesarean section  1973  . Wrist surgery  2007    Right  . Cholecystectomy    . Cataract extraction      History   Social History  . Marital Status: Widowed    Spouse Name: N/A  . Number of Children: 1  . Years of Education: N/A   Occupational History  . Not on file.   Social History Main Topics  . Smoking status: Never Smoker   . Smokeless tobacco: Never Used     Comment:  Tobacco use-no  . Alcohol Use: No  . Drug Use: No  . Sexual Activity: Not on file   Other Topics Concern  . Not on file   Social History Narrative   No regular exercise. Her son lives with her.  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      Filed Vitals:   12/14/14 1105  BP: 71/38  Pulse: 58  Height: 5\' 1"  (1.549 m)  Weight: 104 lb (47.174 kg)    PHYSICAL EXAM General: NAD Neck: No JVD, no thyromegaly. Lungs: Clear to auscultation bilaterally with normal respiratory effort. CV: Nondisplaced PMI. Regular rate and rhythm, normal S1/S2, no S3/S4, soft 1/6 ejection systolic murmur over RUSB. No pretibial or periankle edema. No carotid bruit. Normal pedal pulses.  Abdomen: Soft, nontender, no hepatosplenomegaly, no distention.  Neurologic: Alert and oriented x 3.  Psych: Normal  affect. Extremities: No clubbing or cyanosis.  Skin: Normal. Musculoskeletal: Normal range of motion, no gross deformities. Extremities: No clubbing or cyanosis.   ECG: Most recent ECG reviewed.      ASSESSMENT AND PLAN: 1. Labile hypertension: Neurologically mediated. Very low today. I previously instructed her to take Diovan 40 mg when systolic readings are >/= 160 mmHg, in order to avoid extremely high readings which cause her significant fatigue. I do not want to use medications like midodrine in the event of hypotension, given her propensity for high blood pressures. I previously taught her isotonic exercises such as squatting and crossing her legs as well as wheezing her fists for 30 seconds in order to stimulate high blood pressure. I will prescribe 20-30 mmHg thigh-high compression stockings to help elevate BP conservatively on days of prolonged hypotension. Instructed her to eat more sodium-rich foods along with increased liquid intake on these days. Will make a referral to neurology division at [facility] to see if they have any alternative therapies to offer.  2. Aortic stenosis: Very mild by last echo. Will monitor clinically and surveillance echocardiography as needed.  3. Carotid artery disease: Surveillance imaging as needed.  Dispo: f/u 1 year.   Kate Sable, M.D., F.A.C.C.

## 2014-12-15 ENCOUNTER — Telehealth: Payer: Self-pay | Admitting: *Deleted

## 2014-12-15 NOTE — Telephone Encounter (Signed)
Attempted to contact patient regarding recent OV.  Had questions regarding her referral to [facility].

## 2014-12-15 NOTE — Telephone Encounter (Signed)
FYI from 6.20.16 Patient called and wanted to talk with Edd Fabian in reference to why referral is needed.    Will hold off on making referral until Edd Fabian tells [place] to proceed

## 2014-12-18 NOTE — Telephone Encounter (Signed)
Left message to return call 

## 2014-12-23 NOTE — Telephone Encounter (Signed)
Discussed reason for referral to [facility].  Patient verbalized understanding & ready to move forward with referral.

## 2014-12-31 ENCOUNTER — Telehealth: Payer: Self-pay | Admitting: Cardiovascular Disease

## 2014-12-31 NOTE — Telephone Encounter (Signed)
Spoke with Kathryn Gates to let her know that we are still working on her referral to [facility]. Edd Fabian will be letting patient know of upcoming appointment with the Dr. at [facility].

## 2015-01-27 NOTE — Telephone Encounter (Signed)
I spoke with [name] from [place] and he said the Neurologist looked over the office notes and states there is nothing he can do for Kathryn Gates neurologically but he recommends the patient be seen by one of their cardiologist. Please advise.

## 2015-01-27 NOTE — Telephone Encounter (Signed)
Patient calling asking the status of her referral to [facility]   [phone]

## 2015-01-27 NOTE — Telephone Encounter (Signed)
Ok to make referral

## 2015-01-28 NOTE — Telephone Encounter (Signed)
Patient is scheduled to see Dr. Iona Coach in [facility] on August 11th, 2016 at 1pm.  Information will be mailed to the patients home address.

## 2015-02-04 DIAGNOSIS — Z8673 Personal history of transient ischemic attack (TIA), and cerebral infarction without residual deficits: Secondary | ICD-10-CM | POA: Insufficient documentation

## 2015-02-04 DIAGNOSIS — I350 Nonrheumatic aortic (valve) stenosis: Secondary | ICD-10-CM | POA: Insufficient documentation

## 2015-02-23 ENCOUNTER — Encounter: Payer: Self-pay | Admitting: Cardiology

## 2015-03-02 DIAGNOSIS — E785 Hyperlipidemia, unspecified: Secondary | ICD-10-CM | POA: Insufficient documentation

## 2015-04-27 DIAGNOSIS — R202 Paresthesia of skin: Secondary | ICD-10-CM | POA: Insufficient documentation

## 2015-05-05 ENCOUNTER — Encounter: Payer: Self-pay | Admitting: Cardiovascular Disease

## 2015-05-05 ENCOUNTER — Ambulatory Visit (INDEPENDENT_AMBULATORY_CARE_PROVIDER_SITE_OTHER): Payer: Medicare Other | Admitting: Cardiovascular Disease

## 2015-05-05 VITALS — BP 133/75 | HR 83 | Ht 61.0 in | Wt 112.0 lb

## 2015-05-05 DIAGNOSIS — I350 Nonrheumatic aortic (valve) stenosis: Secondary | ICD-10-CM | POA: Diagnosis not present

## 2015-05-05 DIAGNOSIS — I6529 Occlusion and stenosis of unspecified carotid artery: Secondary | ICD-10-CM | POA: Diagnosis not present

## 2015-05-05 DIAGNOSIS — I10 Essential (primary) hypertension: Secondary | ICD-10-CM

## 2015-05-05 DIAGNOSIS — I158 Other secondary hypertension: Secondary | ICD-10-CM | POA: Diagnosis not present

## 2015-05-05 DIAGNOSIS — R0989 Other specified symptoms and signs involving the circulatory and respiratory systems: Secondary | ICD-10-CM

## 2015-05-05 NOTE — Patient Instructions (Signed)
   Your physician has requested that you have an echocardiogram. Echocardiography is a painless test that uses sound waves to create images of your heart. It provides your doctor with information about the size and shape of your heart and how well your heart's chambers and valves are working. This procedure takes approximately one hour. There are no restrictions for this procedure. Office will contact with results via phone or letter.   Continue all current medications. Your physician wants you to follow up in: 6 months.  You will receive a reminder letter in the mail one-two months in advance.  If you don't receive a letter, please call our office to schedule the follow up appointment     

## 2015-05-05 NOTE — Progress Notes (Signed)
Patient ID: Steele Berg, female   DOB: 12/10/1929, 81 y.o.   MRN: [id number]      SUBJECTIVE: The patient is an 81 year old woman with a history of aortic stenosis, labile hypertension deemed neurologically mediated, generalized anxiety disorder, mild carotid artery disease, and a TIA. Most recent echocardiogram in June 2014 demonstrated very mild aortic stenosis with no evidence of aortic regurgitation. Left ventricular systolic function was normal.  She saw both neurology and cardiology at [facility]. They reduced the dose of valsartan to 40 mg. They recommended considering renal artery Dopplers.  She says she feels like a new person, as she hasn't had a headache in 7 weeks. Denies chest pain and leg swelling.   Review of Systems: As per "subjective", otherwise negative.  No Known Allergies  Current Outpatient Prescriptions  Medication Sig Dispense Refill  . aspirin EC 81 MG tablet Take 81 mg by mouth daily.    Marland Kitchen doxepin (SINEQUAN) 10 MG capsule Take 10 mg by mouth at bedtime.    . Multiple Vitamin (MULTIVITAMIN) capsule Take 1 capsule by mouth daily.    . ondansetron (ZOFRAN) 4 MG tablet Take 4-6 mg by mouth every 6 (six) hours as needed for nausea or vomiting.     . pravastatin (PRAVACHOL) 20 MG tablet Take 20 mg by mouth at bedtime.    . RABEprazole (ACIPHEX) 20 MG tablet Take 20 mg by mouth daily.    . valsartan (DIOVAN) 40 MG tablet Take 1 tablet (40 mg total) by mouth daily as needed. Take 1 tablet by mouth once daily if needed if top number of blood pressure is greater than 160 30 tablet 3   No current facility-administered medications for this visit.    Past Medical History  Diagnosis Date  . Aortic stenosis     a. 2D Echo 11/2013: mild AS.  Marland Kitchen Hyperlipidemia   . Hypertension     a. Labile BP.  Marland Kitchen Carotid stenosis     a. Duplex 1-39% BICA in 11/2013.  Marland Kitchen Aortic regurgitation     a. Seen on 2D echo in 2013 (moderate) but not mentioned in 11/2013 echo.  . Acoustic neuroma  ([place])   . Generalized anxiety disorder   . TIA (transient ischemic attack)   . Headache     a. Possible tension HA versus migraine variant.  . Hyponatremia     Past Surgical History  Procedure Laterality Date  . Cesarean section  1973  . Wrist surgery  2007    Right  . Cholecystectomy    . Cataract extraction      Social History   Social History  . Marital Status: Widowed    Spouse Name: N/A  . Number of Children: 1  . Years of Education: N/A   Occupational History  . Not on file.   Social History Main Topics  . Smoking status: Never Smoker   . Smokeless tobacco: Never Used     Comment: Tobacco use-no  . Alcohol Use: No  . Drug Use: No  . Sexual Activity: Not on file   Other Topics Concern  . Not on file   Social History Narrative   No regular exercise. Her son lives with her.  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      Filed Vitals:   05/05/15 1042  BP: 133/75  Pulse: 83  Height: 5\' 1"  (1.549 m)  Weight: 112 lb (50.803 kg)    PHYSICAL EXAM General: NAD Neck: No JVD, no thyromegaly. Lungs: Clear to auscultation bilaterally  with normal respiratory effort. CV: Nondisplaced PMI. Regular rate and rhythm, normal S1/S2, no S3/S4, soft 1/6 ejection systolic murmur over RUSB. No pretibial or periankle edema. Abdomen: Soft, nontender, no hepatosplenomegaly, no distention.  Neurologic: Alert and oriented x 3.  Psych: Normal affect. Extremities: No clubbing or cyanosis.  Skin: Normal. Musculoskeletal: Normal range of motion, no gross deformities. Extremities: No clubbing or cyanosis.   ECG: Most recent ECG reviewed.      ASSESSMENT AND PLAN: 1. Labile hypertension: Neurologically mediated. Normal today. I previously instructed her to take Diovan 40 mg when systolic readings are >/= 160 mmHg, in order to avoid extremely high readings which cause her significant fatigue. I do not want to use medications like midodrine in the event of hypotension, given her propensity for  high blood pressures. I previously taught her isotonic exercises such as squatting and crossing her legs as well as wheezing her fists for 30 seconds in order to stimulate high blood pressure. I previously prescribed 20-30 mmHg thigh-high compression stockings to help elevate BP conservatively on days of prolonged hypotension. Instructed her to eat more sodium-rich foods along with increased liquid intake on these days.  2. Aortic stenosis: Very mild by last echo. Will repeat echocardiogram to assess for interval progression.  3. Carotid artery disease: Surveillance imaging as needed.  Dispo: f/u 6 months.   Kate Sable, M.D., F.A.C.C.

## 2015-06-02 ENCOUNTER — Other Ambulatory Visit: Payer: Self-pay

## 2015-06-02 ENCOUNTER — Ambulatory Visit (INDEPENDENT_AMBULATORY_CARE_PROVIDER_SITE_OTHER): Payer: Medicare Other

## 2015-06-02 DIAGNOSIS — I350 Nonrheumatic aortic (valve) stenosis: Secondary | ICD-10-CM

## 2015-06-03 ENCOUNTER — Telehealth: Payer: Self-pay | Admitting: *Deleted

## 2015-06-03 NOTE — Telephone Encounter (Signed)
Notes Recorded by Laurine Blazer, LPN on [id number] at [id number] AM Patient notified. Copy to pmd.

## 2015-06-03 NOTE — Telephone Encounter (Signed)
-----   Message from Herminio Commons, MD sent at 06/02/2015  4:32 PM EST ----- Normal pumping function. Mild calcium buildup on aortic valve.

## 2015-07-15 DIAGNOSIS — M23229 Derangement of posterior horn of medial meniscus due to old tear or injury, unspecified knee: Secondary | ICD-10-CM | POA: Insufficient documentation

## 2015-10-17 DIAGNOSIS — R001 Bradycardia, unspecified: Secondary | ICD-10-CM | POA: Insufficient documentation

## 2015-11-04 ENCOUNTER — Ambulatory Visit (INDEPENDENT_AMBULATORY_CARE_PROVIDER_SITE_OTHER): Payer: Medicare Other | Admitting: Cardiovascular Disease

## 2015-11-04 ENCOUNTER — Encounter: Payer: Self-pay | Admitting: Cardiovascular Disease

## 2015-11-04 VITALS — BP 166/72 | HR 78 | Ht 61.0 in | Wt 123.0 lb

## 2015-11-04 DIAGNOSIS — I6529 Occlusion and stenosis of unspecified carotid artery: Secondary | ICD-10-CM

## 2015-11-04 DIAGNOSIS — R079 Chest pain, unspecified: Secondary | ICD-10-CM

## 2015-11-04 DIAGNOSIS — I10 Essential (primary) hypertension: Secondary | ICD-10-CM

## 2015-11-04 DIAGNOSIS — I350 Nonrheumatic aortic (valve) stenosis: Secondary | ICD-10-CM

## 2015-11-04 DIAGNOSIS — R0989 Other specified symptoms and signs involving the circulatory and respiratory systems: Secondary | ICD-10-CM

## 2015-11-04 MED ORDER — NITROGLYCERIN 0.4 MG SL SUBL: 0.4000 mg | SUBLINGUAL_TABLET | SUBLINGUAL | Status: DC | PRN

## 2015-11-04 NOTE — Patient Instructions (Addendum)
   Begin Nitroglycerin as needed for severe chest pain - new sent to [facility] today. Continue all other medications.   Your physician wants you to follow up in: 6 months.  You will receive a reminder letter in the mail one-two months in advance.  If you don't receive a letter, please call our office to schedule the follow up appointment

## 2015-11-04 NOTE — Progress Notes (Signed)
Patient ID: Steele Berg, female   DOB: 1930-06-24, 82 y.o.   MRN: [id number]      SUBJECTIVE: The patient is an 82 year old woman with a history of aortic stenosis, labile hypertension deemed neurologically mediated, generalized anxiety disorder, mild carotid artery disease, and a TIA.   She saw both neurology and cardiology at [facility]. They reduced the dose of valsartan to 40 mg. They recommended considering renal artery Dopplers.  Echocardiogram 06/02/15 demonstrated normal left ventricular systolic function and regional wall motion, LVEF [id number], grade 1 diastolic dysfunction, mild aortic stenosis and regurgitation, and mild mitral regurgitation.  She has been checking her blood pressures regularly and may run from 120-130/60's. She occasionally has shortness of breath which she attributes to weight gain. She experiences chest pain which radiates into her neck and her head about 3 or 4 times per month. It is not associated with exertion. She has not checked her blood pressure during these episodes.    Review of Systems: As per "subjective", otherwise negative.  No Known Allergies  Current Outpatient Prescriptions  Medication Sig Dispense Refill  . aspirin EC 81 MG tablet Take 81 mg by mouth daily.    Marland Kitchen doxepin (SINEQUAN) 10 MG capsule Take 10 mg by mouth at bedtime.    . fluticasone (CUTIVATE) 0.05 % cream Apply 1 application topically 2 (two) times daily as needed.  0  . Multiple Vitamin (MULTIVITAMIN) capsule Take 1 capsule by mouth daily.    . pravastatin (PRAVACHOL) 20 MG tablet Take 20 mg by mouth at bedtime.    . RABEprazole (ACIPHEX) 20 MG tablet Take 20 mg by mouth daily.    . valsartan (DIOVAN) 40 MG tablet Take 1 tablet (40 mg total) by mouth daily as needed. Take 1 tablet by mouth once daily if needed if top number of blood pressure is greater than 160 30 tablet 3   No current facility-administered medications for this visit.    Past Medical History  Diagnosis Date  .  Aortic stenosis     a. 2D Echo 11/2013: mild AS.  Marland Kitchen Hyperlipidemia   . Hypertension     a. Labile BP.  Marland Kitchen Carotid stenosis     a. Duplex 1-39% BICA in 11/2013.  Marland Kitchen Aortic regurgitation     a. Seen on 2D echo in 2013 (moderate) but not mentioned in 11/2013 echo.  . Acoustic neuroma ([place])   . Generalized anxiety disorder   . TIA (transient ischemic attack)   . Headache     a. Possible tension HA versus migraine variant.  . Hyponatremia     Past Surgical History  Procedure Laterality Date  . Cesarean section  1973  . Wrist surgery  2007    Right  . Cholecystectomy    . Cataract extraction      Social History   Social History  . Marital Status: Widowed    Spouse Name: N/A  . Number of Children: 1  . Years of Education: N/A   Occupational History  . Not on file.   Social History Main Topics  . Smoking status: Never Smoker   . Smokeless tobacco: Never Used     Comment: Tobacco use-no  . Alcohol Use: No  . Drug Use: No  . Sexual Activity: Not on file   Other Topics Concern  . Not on file   Social History Narrative   No regular exercise. Her son lives with her.  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      Filed Vitals:  11/04/15 1020  BP: 166/72  Pulse: 78  Height: 5\' 1"  (1.549 m)  Weight: 123 lb (55.792 kg)    PHYSICAL EXAM General: NAD Neck: No JVD, no thyromegaly. Lungs: Clear to auscultation bilaterally with normal respiratory effort. CV: Nondisplaced PMI. Regular rate and rhythm, normal S1/S2, no S3/S4, soft 1/6 ejection systolic murmur over RUSB. No pretibial or periankle edema. Abdomen: Soft, nontender, no distention.  Neurologic: Alert and oriented x 3.  Psych: Normal affect. Extremities: No clubbing or cyanosis.  Skin: Normal. Musculoskeletal: Normal range of motion, no gross deformities. Extremities: No clubbing or cyanosis.    ECG: Most recent ECG reviewed.      ASSESSMENT AND PLAN: 1. Labile hypertension: Neurologically mediated. Elevated today. I  previously instructed her to take Diovan 40 mg when systolic readings are >/= 160 mmHg, in order to avoid extremely high readings which cause her significant fatigue. I do not want to use medications like midodrine in the event of hypotension, given her propensity for high blood pressures. I previously taught her isotonic exercises such as squatting and crossing her legs as well as wheezing her fists for 30 seconds in order to stimulate high blood pressure. I previously prescribed 20-30 mmHg thigh-high compression stockings to help elevate BP conservatively on days of prolonged hypotension. Instructed her to eat more sodium-rich foods along with increased liquid intake on these days.  2. Aortic stenosis: Mild by echocardiogram in 05/2015. Will repeat in several years.  3. Carotid artery disease: Surveillance imaging as needed.  4. Chest pain: Instructed to check BP during these episodes. Will also prescribe SL nitroglycerin prn.  Dispo: f/u 6 months.  Kate Sable, M.D., F.A.C.C.

## 2016-02-09 DIAGNOSIS — A084 Viral intestinal infection, unspecified: Secondary | ICD-10-CM | POA: Diagnosis present

## 2016-02-22 ENCOUNTER — Other Ambulatory Visit: Payer: Self-pay

## 2016-04-19 DIAGNOSIS — C4441 Basal cell carcinoma of skin of scalp and neck: Secondary | ICD-10-CM | POA: Insufficient documentation

## 2016-04-19 DIAGNOSIS — R0600 Dyspnea, unspecified: Secondary | ICD-10-CM | POA: Insufficient documentation

## 2016-05-09 ENCOUNTER — Ambulatory Visit: Payer: Medicare Other | Admitting: Cardiovascular Disease

## 2016-05-29 ENCOUNTER — Encounter: Payer: Self-pay | Admitting: *Deleted

## 2016-05-30 ENCOUNTER — Encounter: Payer: Self-pay | Admitting: Cardiovascular Disease

## 2016-05-30 ENCOUNTER — Ambulatory Visit (INDEPENDENT_AMBULATORY_CARE_PROVIDER_SITE_OTHER): Payer: Medicare Other | Admitting: Cardiovascular Disease

## 2016-05-30 ENCOUNTER — Encounter: Payer: Self-pay | Admitting: *Deleted

## 2016-05-30 VITALS — BP 160/78 | HR 68 | Ht 60.0 in | Wt 126.0 lb

## 2016-05-30 DIAGNOSIS — E7800 Pure hypercholesterolemia, unspecified: Secondary | ICD-10-CM

## 2016-05-30 DIAGNOSIS — I10 Essential (primary) hypertension: Secondary | ICD-10-CM | POA: Diagnosis not present

## 2016-05-30 DIAGNOSIS — I779 Disorder of arteries and arterioles, unspecified: Secondary | ICD-10-CM | POA: Diagnosis not present

## 2016-05-30 DIAGNOSIS — I350 Nonrheumatic aortic (valve) stenosis: Secondary | ICD-10-CM | POA: Diagnosis not present

## 2016-05-30 DIAGNOSIS — I739 Peripheral vascular disease, unspecified: Secondary | ICD-10-CM

## 2016-05-30 DIAGNOSIS — I6529 Occlusion and stenosis of unspecified carotid artery: Secondary | ICD-10-CM | POA: Diagnosis not present

## 2016-05-30 DIAGNOSIS — R0989 Other specified symptoms and signs involving the circulatory and respiratory systems: Secondary | ICD-10-CM

## 2016-05-30 MED ORDER — EZETIMIBE 10 MG PO TABS: 10.0000 mg | ORAL_TABLET | Freq: Every day | ORAL | 11 refills | Status: DC

## 2016-05-30 NOTE — Progress Notes (Signed)
SUBJECTIVE: The patient is an 82 year old woman with a history of aortic stenosis, labile hypertension deemed neurologically mediated, generalized anxiety disorder, mild carotid artery disease, and a TIA.   Echocardiogram 06/02/15 demonstrated normal left ventricular systolic function and regional wall motion, LVEF 123456, grade 1 diastolic dysfunction, mild aortic stenosis and regurgitation, and mild mitral regurgitation.  She reportedly underwent a stress test and had carotid Dopplers done at [facility], but these results are presently unavailable to me. She tells me the stress test was normal. She denies chest pain and has not had to use nitroglycerin.   Blood pressures at home range between 127-134/60's.  Labs 04/17/16: Total cholesterol 241, triglycerides 71, HDL 59, LDL 168, BUN 11, creatinine 1.  Statins cause myalgias.   Review of Systems: As per "subjective", otherwise negative.  No Known Allergies  Current Outpatient Prescriptions  Medication Sig Dispense Refill  . aspirin EC 81 MG tablet Take 81 mg by mouth daily.    Marland Kitchen doxepin (SINEQUAN) 10 MG capsule Take 10 mg by mouth at bedtime.    . fluticasone (CUTIVATE) 0.05 % cream Apply 1 application topically 2 (two) times daily as needed.  0  . Multiple Vitamin (MULTIVITAMIN) capsule Take 1 capsule by mouth daily.    . nitroGLYCERIN (NITROSTAT) 0.4 MG SL tablet Place 1 tablet (0.4 mg total) under the tongue every 5 (five) minutes as needed for chest pain. 25 tablet 3  . pravastatin (PRAVACHOL) 20 MG tablet Take 20 mg by mouth at bedtime.    . RABEprazole (ACIPHEX) 20 MG tablet Take 20 mg by mouth daily.    . valsartan (DIOVAN) 40 MG tablet Take 1 tablet (40 mg total) by mouth daily as needed. Take 1 tablet by mouth once daily if needed if top number of blood pressure is greater than 160 30 tablet 3   No current facility-administered medications for this visit.     Past Medical History:  Diagnosis Date  .  Acoustic neuroma ([place])   . Aortic regurgitation    a. Seen on 2D echo in 2013 (moderate) but not mentioned in 11/2013 echo.  . Aortic stenosis    a. 2D Echo 11/2013: mild AS.  Marland Kitchen Carotid stenosis    a. Duplex 1-39% BICA in 11/2013.  Marland Kitchen Generalized anxiety disorder   . Headache    a. Possible tension HA versus migraine variant.  . Hyperlipidemia   . Hypertension    a. Labile BP.  Marland Kitchen Hyponatremia   . TIA (transient ischemic attack)     Past Surgical History:  Procedure Laterality Date  . CATARACT EXTRACTION    . [place]  . CHOLECYSTECTOMY    . WRIST SURGERY  2007   Right    Social History   Social History  . Marital status: Widowed    Spouse name: N/A  . Number of children: 1  . Years of education: N/A   Occupational History  . Not on file.   Social History Main Topics  . Smoking status: Never Smoker  . Smokeless tobacco: Never Used     Comment: Tobacco use-no  . Alcohol use No  . Drug use: No  . Sexual activity: Not on file   Other Topics Concern  . Not on file   Social History Narrative   No regular exercise. Her son lives with her.  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      Vitals:   05/30/16 0824  BP: (!) 160/78  Pulse: 68  SpO2: 97%  Weight: 126 lb (57.2 kg)  Height: 5' (1.524 m)    PHYSICAL EXAM General: NAD Neck: No JVD, no thyromegaly. Lungs: Clear to auscultation bilaterally with normal respiratory effort. CV: Nondisplaced PMI. Regular rate and rhythm, normal S1/S2, no S3/S4, soft 2/6 ejection systolic murmur over RUSB. No pretibial or periankle edema. Abdomen: Soft, nontender, no distention.  Neurologic: Alert and oriented x 3.  Psych: Normal affect. Extremities: No clubbing or cyanosis.  Skin: Normal. Musculoskeletal: Normal range of motion, no gross deformities. Extremities: No clubbing or cyanosis.     ECG: Most recent ECG reviewed.      ASSESSMENT AND PLAN: 1. Labile hypertension: Neurologically mediated. Elevated today but  has been running within normal limits at home.  I previously instructed her to take Diovan 40 mg when systolic readings are >/= 160 mmHg, in order to avoid extremely high readings which cause her significant fatigue. I do not want to use medications like midodrine in the event of hypotension, given her propensity for high blood pressures. I previously taught her isotonic exercises such as squatting and crossing her legs as well as wheezing her fists for 30 seconds in order to stimulate high blood pressure. I previously prescribed 20-30 mmHg thigh-high compression stockings to help elevate BP conservatively on days of prolonged hypotension. Instructed her to eat more sodium-rich foods along with increased liquid intake on these days.  2. Aortic stenosis: Mild by echocardiogram in 05/2015. Will repeat in several years.  3. Carotid artery disease: Will obtain most recent results.  4. Chest pain: Stable. Instructed to check BP during these episodes. Continue SL nitroglycerin prn.  5. Hypercholesterolemia: Statin intolerant. Labs reviewed above. Will start Zetia 10 mg.  Dispo: f/u 1 yr.   Kate Sable, M.D., F.A.C.C.

## 2016-05-30 NOTE — Patient Instructions (Signed)
Medication Instructions:   Begin Zetia 10mg  daily.  Continue all other medications.    Labwork: none  Testing/Procedures: none  Follow-Up: Your physician wants you to follow up in:  1 year.  You will receive a reminder letter in the mail one-two months in advance.  If you don't receive a letter, please call our office to schedule the follow up appointment   Any Other Special Instructions Will Be Listed Below (If Applicable).  If you need a refill on your cardiac medications before your next appointment, please call your pharmacy.

## 2016-05-31 ENCOUNTER — Encounter: Payer: Self-pay | Admitting: Cardiovascular Disease

## 2016-06-07 ENCOUNTER — Ambulatory Visit: Payer: Medicare Other | Admitting: Cardiovascular Disease

## 2016-07-17 DIAGNOSIS — R490 Dysphonia: Secondary | ICD-10-CM | POA: Insufficient documentation

## 2016-07-17 DIAGNOSIS — J0110 Acute frontal sinusitis, unspecified: Secondary | ICD-10-CM | POA: Insufficient documentation

## 2016-08-20 DIAGNOSIS — R2681 Unsteadiness on feet: Secondary | ICD-10-CM | POA: Insufficient documentation

## 2016-08-20 DIAGNOSIS — N183 Chronic kidney disease, stage 3 unspecified: Secondary | ICD-10-CM | POA: Insufficient documentation

## 2016-10-16 DIAGNOSIS — S161XXA Strain of muscle, fascia and tendon at neck level, initial encounter: Secondary | ICD-10-CM | POA: Insufficient documentation

## 2016-10-31 DIAGNOSIS — N3281 Overactive bladder: Secondary | ICD-10-CM | POA: Insufficient documentation

## 2016-10-31 DIAGNOSIS — R351 Nocturia: Secondary | ICD-10-CM | POA: Insufficient documentation

## 2016-12-18 ENCOUNTER — Encounter: Payer: Self-pay | Admitting: Cardiovascular Disease

## 2016-12-18 ENCOUNTER — Ambulatory Visit (INDEPENDENT_AMBULATORY_CARE_PROVIDER_SITE_OTHER): Payer: Medicare Other | Admitting: Cardiovascular Disease

## 2016-12-18 VITALS — BP 142/62 | HR 72 | Ht 60.0 in | Wt 123.0 lb

## 2016-12-18 DIAGNOSIS — I350 Nonrheumatic aortic (valve) stenosis: Secondary | ICD-10-CM

## 2016-12-18 DIAGNOSIS — E559 Vitamin D deficiency, unspecified: Secondary | ICD-10-CM

## 2016-12-18 DIAGNOSIS — I779 Disorder of arteries and arterioles, unspecified: Secondary | ICD-10-CM

## 2016-12-18 DIAGNOSIS — I739 Peripheral vascular disease, unspecified: Secondary | ICD-10-CM

## 2016-12-18 DIAGNOSIS — R0989 Other specified symptoms and signs involving the circulatory and respiratory systems: Secondary | ICD-10-CM | POA: Diagnosis not present

## 2016-12-18 DIAGNOSIS — E7800 Pure hypercholesterolemia, unspecified: Secondary | ICD-10-CM | POA: Diagnosis not present

## 2016-12-18 NOTE — Patient Instructions (Signed)

## 2016-12-18 NOTE — Progress Notes (Signed)
SUBJECTIVE: The patient is an 83 year old woman with a history of aortic stenosis, labile hypertension deemed neurologically mediated, generalized anxiety disorder, mild carotid artery disease, and a TIA.   Echocardiogram 06/02/15 demonstrated normal left ventricular systolic function and regional wall motion, LVEF 79-02%, grade 1 diastolic dysfunction, mild aortic stenosis and regurgitation, and mild mitral regurgitation.  She has been checking her blood pressure and overall it has remained well controlled. She denies chest pain, shortness of breath, palpitations, lightheadedness, and dizziness.  She takes valsartan as needed and this occurs about 2 or 3 times per month.  I reviewed labs performed on 11/16/16: BUN 13, creatinine 1.03, sodium 138, potassium 5, hemoglobin 12.5, total cholesterol 216, triglycerides 58, HDL 70, LDL 134, TSH 2.3, vitamin D low at 26.2.  She takes a vitamin which provides 30% of [place] recommended daily allowance.  She tells me that she has a fear of falling. She does not participate in exercise classes.   Review of Systems: As per "subjective", otherwise negative.  No Known Allergies  Current Outpatient Prescriptions  Medication Sig Dispense Refill  . aspirin EC 81 MG tablet Take 81 mg by mouth daily.    Marland Kitchen ezetimibe (ZETIA) 10 MG tablet Take 10 mg by mouth daily.    . Lutein-Zeaxanthin (OCUVITE LUTEIN 25 PO) Take 25 mg by mouth daily.    . Multiple Vitamin (MULTIVITAMIN) capsule Take 1 capsule by mouth daily.    . nitroGLYCERIN (NITROSTAT) 0.4 MG SL tablet Place 1 tablet (0.4 mg total) under the tongue every 5 (five) minutes as needed for chest pain. 25 tablet 3  . RABEprazole (ACIPHEX) 20 MG tablet Take 20 mg by mouth daily.    . valsartan (DIOVAN) 40 MG tablet Take 1 tablet (40 mg total) by mouth daily as needed. Take 1 tablet by mouth once daily if needed if top number of blood pressure is greater than 160 30 tablet 3   No current  facility-administered medications for this visit.     Past Medical History:  Diagnosis Date  . Acoustic neuroma ([place])   . Aortic regurgitation    a. Seen on 2D echo in 2013 (moderate) but not mentioned in 11/2013 echo.  . Aortic stenosis    a. 2D Echo 11/2013: mild AS.  Marland Kitchen Carotid stenosis    a. Duplex 1-39% BICA in 11/2013.  Marland Kitchen Generalized anxiety disorder   . Headache    a. Possible tension HA versus migraine variant.  . Hyperlipidemia   . Hypertension    a. Labile BP.  Marland Kitchen Hyponatremia   . TIA (transient ischemic attack)     Past Surgical History:  Procedure Laterality Date  . CATARACT EXTRACTION    . [place]  . CHOLECYSTECTOMY    . WRIST SURGERY  2007   Right    Social History   Social History  . Marital status: Widowed    Spouse name: N/A  . Number of children: 1  . Years of education: N/A   Occupational History  . Not on file.   Social History Main Topics  . Smoking status: Never Smoker  . Smokeless tobacco: Never Used     Comment: Tobacco use-no  . Alcohol use No  . Drug use: No  . Sexual activity: Not on file   Other Topics Concern  . Not on file   Social History Narrative   No regular exercise. Her son lives with her.  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  Vitals:   12/18/16 1457  BP: (!) 142/62  Pulse: 72  SpO2: 99%  Weight: 123 lb (55.8 kg)  Height: 5' (1.524 m)    Wt Readings from Last 3 Encounters:  12/18/16 123 lb (55.8 kg)  05/30/16 126 lb (57.2 kg)  11/04/15 123 lb (55.8 kg)     PHYSICAL EXAM General: NAD HEENT: Normal. Neck: No JVD, no thyromegaly. Lungs: Clear to auscultation bilaterally with normal respiratory effort. CV: Nondisplaced PMI.  Regular rate and rhythm, normal S1/S2, no S3/S4, soft 2/6 ejection systolic murmur over RUSB. No pretibial or periankle edema.   Abdomen: Soft, nontender, no distention.  Neurologic: Alert and oriented.  Psych: Normal affect. Skin: Normal. Musculoskeletal: No gross  deformities.    ECG: Most recent ECG reviewed.   Labs: Lab Results  Component Value Date/Time   K 4.7 10/08/2014 03:21 PM   BUN 17 10/08/2014 03:21 PM   CREATININE 1.18 (H) 10/08/2014 03:21 PM   ALT <8 10/08/2014 03:21 PM   TSH 2.524 10/08/2014 03:21 PM   HGB 10.7 (L) 10/08/2014 03:21 PM     Lipids: Lab Results  Component Value Date/Time   LDLCALC 152 (H) 12/13/2012 05:55 AM   CHOL 237 (H) 12/13/2012 05:55 AM   TRIG 62 12/13/2012 05:55 AM   HDL 73 12/13/2012 05:55 AM       ASSESSMENT AND PLAN: 1. Labile hypertension: Neurologically mediated. Only mildly elevated today. Overall appears to be stable. I previously instructed her to take Diovan 40 mg when systolic readings are >/= 160 mmHg, in order to avoid extremely high readings which cause her significant fatigue. I do not want to use medications like midodrine in the event of hypotension, given her propensity for high blood pressures. I previously taught her isotonic exercises such as squatting and crossing her legs as well as wheezing her fists for 30 seconds in order to stimulate high blood pressure. I previously prescribed 20-30 mmHg thigh-high compression stockings to help elevate BP conservatively on days of prolonged hypotension. Instructed her to eat more sodium-rich foods along with increased liquid intake on these days.  2. Aortic stenosis: Mild by echocardiogram in 05/2015. Will repeat in several years.  3. Carotid artery disease: Stable.  4. Chest pain: Stable. Instructed to check BP during these episodes. Continue SL nitroglycerin prn.  5. Hypercholesterolemia: Statin intolerant. Continue Zetia 10 mg. lipids reviewed above.  6. Vitamin D deficiency: She takes a vitamin which provides 30% of [place] recommended daily allowance. I recommended she take supplemental vitamin D so that she consumes 100% of [place] RDA. Vitamin D also helps to decrease falls in the elderly whether or not they have a vitamin D deficiency. I  also recommended she participate in the Silver Sneakers program at the [facility] to increase hip flexor and quadriceps strength which would also help diminish falls.      Disposition: Follow up 1 yr  Kate Sable, M.D., F.A.C.C.

## 2017-11-06 ENCOUNTER — Other Ambulatory Visit: Payer: Self-pay

## 2017-11-06 ENCOUNTER — Ambulatory Visit (INDEPENDENT_AMBULATORY_CARE_PROVIDER_SITE_OTHER): Payer: Medicare Other | Admitting: Cardiovascular Disease

## 2017-11-06 ENCOUNTER — Encounter: Payer: Self-pay | Admitting: Cardiovascular Disease

## 2017-11-06 VITALS — BP 168/92 | HR 78 | Ht 61.0 in | Wt 122.0 lb

## 2017-11-06 DIAGNOSIS — I779 Disorder of arteries and arterioles, unspecified: Secondary | ICD-10-CM

## 2017-11-06 DIAGNOSIS — R0989 Other specified symptoms and signs involving the circulatory and respiratory systems: Secondary | ICD-10-CM

## 2017-11-06 DIAGNOSIS — E7800 Pure hypercholesterolemia, unspecified: Secondary | ICD-10-CM | POA: Diagnosis not present

## 2017-11-06 DIAGNOSIS — I739 Peripheral vascular disease, unspecified: Secondary | ICD-10-CM

## 2017-11-06 DIAGNOSIS — I350 Nonrheumatic aortic (valve) stenosis: Secondary | ICD-10-CM

## 2017-11-06 NOTE — Progress Notes (Signed)
SUBJECTIVE: The patient is an 84 year old woman with a history of aortic stenosis, labile hypertension deemed neurologically mediated, generalized anxiety disorder, mild carotid artery disease, and a TIA.   Echocardiogram 06/02/15 demonstrated normal left ventricular systolic function and regional wall motion, LVEF 24-23%, grade 1 diastolic dysfunction, mild aortic stenosis and regurgitation, and mild mitral regurgitation.  The patient denies any symptoms of chest pain, palpitations, shortness of breath, lightheadedness, dizziness, leg swelling, orthopnea, PND, and syncope.  I reviewed her blood pressure log.  Most blood pressures have ranged from 113-148/60-70.  Overall, the average 136/60-70.  Her biggest fear is a fear of falling.  She is worried about her gait.  She does not go to the gym nor do exercises.  She does stay active around her house.       Review of Systems: As per "subjective", otherwise negative.  No Known Allergies  Current Outpatient Medications  Medication Sig Dispense Refill  . aspirin EC 81 MG tablet Take 81 mg by mouth daily.    Marland Kitchen ezetimibe (ZETIA) 10 MG tablet Take 10 mg by mouth daily.    . Lutein-Zeaxanthin (OCUVITE LUTEIN 25 PO) Take 25 mg by mouth daily.    . Multiple Vitamin (MULTIVITAMIN) capsule Take 1 capsule by mouth daily.    . nitroGLYCERIN (NITROSTAT) 0.4 MG SL tablet Place 1 tablet (0.4 mg total) under the tongue every 5 (five) minutes as needed for chest pain. 25 tablet 3  . olmesartan (BENICAR) 20 MG tablet Take 20 mg by mouth as needed.  1  . RABEprazole (ACIPHEX) 20 MG tablet Take 20 mg by mouth daily.     No current facility-administered medications for this visit.     Past Medical History:  Diagnosis Date  . Acoustic neuroma ([place])   . Aortic regurgitation    a. Seen on 2D echo in 2013 (moderate) but not mentioned in 11/2013 echo.  . Aortic stenosis    a. 2D Echo 11/2013: mild AS.  Marland Kitchen Carotid stenosis    a. Duplex 1-39% BICA in  11/2013.  Marland Kitchen Generalized anxiety disorder   . Headache    a. Possible tension HA versus migraine variant.  . Hyperlipidemia   . Hypertension    a. Labile BP.  Marland Kitchen Hyponatremia   . TIA (transient ischemic attack)     Past Surgical History:  Procedure Laterality Date  . CATARACT EXTRACTION    . [place]  . CHOLECYSTECTOMY    . WRIST SURGERY  2007   Right    Social History   Socioeconomic History  . Marital status: Widowed    Spouse name: Not on file  . Number of children: 1  . Years of education: Not on file  . Highest education level: Not on file  Occupational History  . Not on file  Social Needs  . Financial resource strain: Not on file  . Food insecurity:    Worry: Not on file    Inability: Not on file  . Transportation needs:    Medical: Not on file    Non-medical: Not on file  Tobacco Use  . Smoking status: Never Smoker  . Smokeless tobacco: Never Used  . Tobacco comment: Tobacco use-no  Substance and Sexual Activity  . Alcohol use: No    Alcohol/week: 0.0 oz  . Drug use: No  . Sexual activity: Not on file  Lifestyle  . Physical activity:    Days per week: Not on file  Minutes per session: Not on file  . Stress: Not on file  Relationships  . Social connections:    Talks on phone: Not on file    Gets together: Not on file    Attends religious service: Not on file    Active member of club or organization: Not on file    Attends meetings of clubs or organizations: Not on file    Relationship status: Not on file  . Intimate partner violence:    Fear of current or ex partner: Not on file    Emotionally abused: Not on file    Physically abused: Not on file    Forced sexual activity: Not on file  Other Topics Concern  . Not on file  Social History Narrative   No regular exercise. Her son lives with her.  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      Vitals:   11/06/17 1140  BP: (!) 168/92  Pulse: 78  SpO2: 99%  Weight: 122 lb (55.3 kg)  Height: 5\' 1"   (1.549 m)    Wt Readings from Last 3 Encounters:  11/06/17 122 lb (55.3 kg)  12/18/16 123 lb (55.8 kg)  05/30/16 126 lb (57.2 kg)     PHYSICAL EXAM General: NAD HEENT: Normal. Neck: No JVD, no thyromegaly. Lungs: Clear to auscultation bilaterally with normal respiratory effort. CV: Regular rate and rhythm, normal S1/S2, no S3/S4, soft 2/6 ejection systolic murmur over RUSB. No pretibial or periankle edema.  No carotid bruit.   Abdomen: Soft, nontender, no distention.  Neurologic: Alert and oriented.  Psych: Normal affect. Skin: Normal. Musculoskeletal: No gross deformities.    ECG: Most recent ECG reviewed.   Labs: Lab Results  Component Value Date/Time   K 4.7 10/08/2014 03:21 PM   BUN 17 10/08/2014 03:21 PM   CREATININE 1.18 (H) 10/08/2014 03:21 PM   ALT <8 10/08/2014 03:21 PM   TSH 2.524 10/08/2014 03:21 PM   HGB 10.7 (L) 10/08/2014 03:21 PM     Lipids: Lab Results  Component Value Date/Time   LDLCALC 152 (H) 12/13/2012 05:55 AM   CHOL 237 (H) 12/13/2012 05:55 AM   TRIG 62 12/13/2012 05:55 AM   HDL 73 12/13/2012 05:55 AM       ASSESSMENT AND PLAN:  1. Labile hypertension: Neurologically mediated.   Elevated today but a review of her blood pressure log demonstrates good control overall. I previously instructed her to take Diovan 40 mg when systolic readings are >/= 160 mmHg, in order to avoid extremely high readings which cause her significant fatigue. I do not want to use medications like midodrine in the event of hypotension, given her propensity for high blood pressures. I previously taught her isotonic exercises such as squatting and crossing her legs as well as wheezing her fists for 30 seconds in order to stimulate high blood pressure. I previously prescribed 20-30 mmHg thigh-high compression stockings to help elevate BP conservatively on days of prolonged hypotension. Instructed her to eat more sodium-rich foods along with increased liquid intake on these  days.  2. Aortic stenosis: Mild by echocardiogram in 05/2015. Will repeat to assess for interval progression.  3. Carotid artery disease: Stable.  4. Chest pain: Stable.Instructed to check BP during these episodes. ContinueSL nitroglycerin prn.  5. Hypercholesterolemia: Statin intolerant. Continue Zetia 10 mg.      Disposition: Follow up 1 year   Kate Sable, M.D., F.A.C.C.

## 2017-11-06 NOTE — Patient Instructions (Signed)

## 2017-11-13 ENCOUNTER — Other Ambulatory Visit: Payer: Self-pay | Admitting: Cardiovascular Disease

## 2017-11-14 DIAGNOSIS — Z681 Body mass index (BMI) 19 or less, adult: Secondary | ICD-10-CM | POA: Insufficient documentation

## 2017-11-26 DIAGNOSIS — J301 Allergic rhinitis due to pollen: Secondary | ICD-10-CM | POA: Insufficient documentation

## 2017-11-26 DIAGNOSIS — R059 Cough, unspecified: Secondary | ICD-10-CM | POA: Insufficient documentation

## 2017-11-26 DIAGNOSIS — H66002 Acute suppurative otitis media without spontaneous rupture of ear drum, left ear: Secondary | ICD-10-CM | POA: Insufficient documentation

## 2017-12-05 ENCOUNTER — Other Ambulatory Visit: Payer: Medicare Other

## 2017-12-13 DIAGNOSIS — E559 Vitamin D deficiency, unspecified: Secondary | ICD-10-CM | POA: Insufficient documentation

## 2018-01-09 ENCOUNTER — Other Ambulatory Visit: Payer: Self-pay

## 2018-01-09 ENCOUNTER — Ambulatory Visit (INDEPENDENT_AMBULATORY_CARE_PROVIDER_SITE_OTHER): Payer: Medicare Other

## 2018-01-09 DIAGNOSIS — I350 Nonrheumatic aortic (valve) stenosis: Secondary | ICD-10-CM | POA: Diagnosis not present

## 2018-01-11 ENCOUNTER — Telehealth: Payer: Self-pay | Admitting: *Deleted

## 2018-01-11 NOTE — Telephone Encounter (Signed)
Notes recorded by Laurine Blazer, LPN on 3/67/2550 at 01:64 AM EDT Patient notified. Copy to pmd. ------  Notes recorded by Herminio Commons, MD on 01/10/2018 at 8:49 AM EDT Normal pumping function. Mild to moderate calcium buildup and aortic valve narrowing. Valve leakage seen.

## 2018-05-06 DIAGNOSIS — K5732 Diverticulitis of large intestine without perforation or abscess without bleeding: Secondary | ICD-10-CM | POA: Insufficient documentation

## 2018-10-15 ENCOUNTER — Other Ambulatory Visit: Payer: Self-pay | Admitting: Cardiovascular Disease

## 2018-11-13 ENCOUNTER — Telehealth: Payer: Self-pay | Admitting: *Deleted

## 2018-11-13 NOTE — Telephone Encounter (Signed)
   Primary Cardiologist:  Kate Sable, MD   Patient contacted.  History reviewed.  No symptoms to suggest any unstable cardiac conditions.  Based on discussion, with current pandemic situation, we will be postponing this appointment for [place] with a plan for f/u in August 2020 or sooner if feasible/necessary.  If symptoms change, she has been instructed to contact our office.    Marlou Sa, RN  11/13/2018 3:15 PM         .

## 2018-11-19 ENCOUNTER — Ambulatory Visit: Payer: Medicare Other | Admitting: Cardiovascular Disease

## 2018-12-05 DIAGNOSIS — R55 Syncope and collapse: Secondary | ICD-10-CM | POA: Insufficient documentation

## 2018-12-05 DIAGNOSIS — I2510 Atherosclerotic heart disease of native coronary artery without angina pectoris: Secondary | ICD-10-CM | POA: Diagnosis present

## 2019-02-13 ENCOUNTER — Ambulatory Visit: Payer: Medicare Other | Admitting: Cardiovascular Disease

## 2019-02-24 DIAGNOSIS — M19042 Primary osteoarthritis, left hand: Secondary | ICD-10-CM | POA: Insufficient documentation

## 2019-02-24 DIAGNOSIS — J3489 Other specified disorders of nose and nasal sinuses: Secondary | ICD-10-CM | POA: Insufficient documentation

## 2019-02-24 DIAGNOSIS — M65342 Trigger finger, left ring finger: Secondary | ICD-10-CM | POA: Insufficient documentation

## 2019-04-07 ENCOUNTER — Ambulatory Visit: Payer: Medicare Other | Admitting: Cardiovascular Disease

## 2019-04-29 DIAGNOSIS — G5793 Unspecified mononeuropathy of bilateral lower limbs: Secondary | ICD-10-CM | POA: Insufficient documentation

## 2019-09-26 ENCOUNTER — Other Ambulatory Visit: Payer: Self-pay | Admitting: *Deleted

## 2019-09-26 MED ORDER — EZETIMIBE 10 MG PO TABS: 10.0000 mg | ORAL_TABLET | Freq: Every day | ORAL | 0 refills | Status: AC

## 2019-10-23 ENCOUNTER — Ambulatory Visit: Payer: Medicare Other | Admitting: Cardiovascular Disease

## 2019-11-14 ENCOUNTER — Telehealth: Payer: Self-pay | Admitting: Cardiovascular Disease

## 2019-11-14 ENCOUNTER — Encounter: Payer: Self-pay | Admitting: Cardiovascular Disease

## 2019-11-14 ENCOUNTER — Encounter: Payer: Self-pay | Admitting: *Deleted

## 2019-11-14 ENCOUNTER — Ambulatory Visit (INDEPENDENT_AMBULATORY_CARE_PROVIDER_SITE_OTHER): Payer: Medicare Other | Admitting: Cardiovascular Disease

## 2019-11-14 VITALS — BP 160/64 | HR 76 | Ht 61.0 in | Wt 108.8 lb

## 2019-11-14 DIAGNOSIS — I38 Endocarditis, valve unspecified: Secondary | ICD-10-CM

## 2019-11-14 DIAGNOSIS — I350 Nonrheumatic aortic (valve) stenosis: Secondary | ICD-10-CM | POA: Diagnosis not present

## 2019-11-14 DIAGNOSIS — I340 Nonrheumatic mitral (valve) insufficiency: Secondary | ICD-10-CM | POA: Diagnosis not present

## 2019-11-14 DIAGNOSIS — I10 Essential (primary) hypertension: Secondary | ICD-10-CM | POA: Diagnosis not present

## 2019-11-14 DIAGNOSIS — R0600 Dyspnea, unspecified: Secondary | ICD-10-CM

## 2019-11-14 DIAGNOSIS — R0609 Other forms of dyspnea: Secondary | ICD-10-CM

## 2019-11-14 NOTE — Patient Instructions (Signed)
Medication Instructions:  Continue all current medications.  Labwork: none  Testing/Procedures:  Your physician has requested that you have an echocardiogram. Echocardiography is a painless test that uses sound waves to create images of your heart. It provides your doctor with information about the size and shape of your heart and how well your heart's chambers and valves are working. This procedure takes approximately one hour. There are no restrictions for this procedure.  Office will contact with results via phone or letter.    Follow-Up: 2 months   Any Other Special Instructions Will Be Listed Below (If Applicable).  If you need a refill on your cardiac medications before your next appointment, please call your pharmacy.  

## 2019-11-14 NOTE — Telephone Encounter (Signed)
°  Patient Consent for Virtual Visit         [name] has provided verbal consent on 11/14/2019 for a virtual visit (video or telephone).   CONSENT FOR VIRTUAL VISIT FOR:  Kathryn Gates  By participating in this virtual visit I agree to the following:  I hereby voluntarily request, consent and authorize [place] and its employed or contracted physicians, physician assistants, nurse practitioners or other licensed health care professionals (the Practitioner), to provide me with telemedicine health care services (the Services") as deemed necessary by the treating Practitioner. I acknowledge and consent to receive the Services by the Practitioner via telemedicine. I understand that the telemedicine visit will involve communicating with the Practitioner through live audiovisual communication technology and the disclosure of certain medical information by electronic transmission. I acknowledge that I have been given the opportunity to request an in-person assessment or other available alternative prior to the telemedicine visit and am voluntarily participating in the telemedicine visit.  I understand that I have the right to withhold or withdraw my consent to the use of telemedicine in the course of my care at any time, without affecting my right to future care or treatment, and that the Practitioner or I may terminate the telemedicine visit at any time. I understand that I have the right to inspect all information obtained and/or recorded in the course of the telemedicine visit and may receive copies of available information for a reasonable fee.  I understand that some of the potential risks of receiving the Services via telemedicine include:   Delay or interruption in medical evaluation due to technological equipment failure or disruption;  Information transmitted may not be sufficient (e.g. poor resolution of images) to allow for appropriate medical decision making by the Practitioner;  and/or   In rare instances, security protocols could fail, causing a breach of personal health information.  Furthermore, I acknowledge that it is my responsibility to provide information about my medical history, conditions and care that is complete and accurate to the best of my ability. I acknowledge that Practitioner's advice, recommendations, and/or decision may be based on factors not within their control, such as incomplete or inaccurate data provided by me or distortions of diagnostic images or specimens that may result from electronic transmissions. I understand that the practice of medicine is not an exact science and that Practitioner makes no warranties or guarantees regarding treatment outcomes. I acknowledge that a copy of this consent can be made available to me via my patient portal ([facility] [facility]), or I can request a printed copy by calling the office of [place].    I understand that my insurance will be billed for this visit.   I have read or had this consent read to me.  I understand the contents of this consent, which adequately explains the benefits and risks of the Services being provided via telemedicine.   I have been provided ample opportunity to ask questions regarding this consent and the Services and have had my questions answered to my satisfaction.  I give my informed consent for the services to be provided through the use of telemedicine in my medical care

## 2019-11-14 NOTE — Progress Notes (Signed)
SUBJECTIVE: The patient presents for overdue follow-up.  I last saw her in May 2019.  She has a history of aortic stenosis, labile hypertension deemed neurologically mediated, generalized anxiety disorder, mild carotid artery disease, and a TIA.   Echocardiogram on 01/09/2018 demonstrated normal LV systolic function and grade 1 diastolic dysfunction, LVEF 60 to 65%, with mild to moderate aortic stenosis, moderate mitral and tricuspid regurgitation.  I personally reviewed the ECG performed today which demonstrates sinus rhythm with PACs.  She has been experiencing progressive exertional dyspnea over the last several months.  He has been concerning to her.  She denies exertional chest pain and leg swelling.  Her blood pressures fluctuate but I reviewed her blood pressure log with several systolic readings in the [id number] range.    Review of Systems: As per "subjective", otherwise negative.  No Known Allergies  Current Outpatient Medications  Medication Sig Dispense Refill  . aspirin EC 81 MG tablet Take 81 mg by mouth daily.    . busPIRone (BUSPAR) 5 MG tablet Take 5 mg by mouth every 8 (eight) hours as needed.    . ezetimibe (ZETIA) 10 MG tablet Take 1 tablet (10 mg total) by mouth daily. 7 tablet 0  . fluticasone (FLONASE) 50 MCG/ACT nasal spray Place 2 sprays into both nostrils daily.    Marland Kitchen loratadine (CLARITIN) 10 MG tablet Take 10 mg by mouth daily as needed.    . Multiple Vitamin (MULTIVITAMIN) capsule Take 1 capsule by mouth daily.    . nitroGLYCERIN (NITROSTAT) 0.4 MG SL tablet Place 1 tablet (0.4 mg total) under the tongue every 5 (five) minutes as needed for chest pain. 25 tablet 3  . olmesartan (BENICAR) 20 MG tablet Take 20 mg by mouth as needed.  1  . RABEprazole (ACIPHEX) 20 MG tablet Take 20 mg by mouth daily.     No current facility-administered medications for this visit.    Past Medical History:  Diagnosis Date  . Acoustic neuroma ([place])   . Aortic regurgitation     a. Seen on 2D echo in 2013 (moderate) but not mentioned in 11/2013 echo.  . Aortic stenosis    a. 2D Echo 11/2013: mild AS.  Marland Kitchen Carotid stenosis    a. Duplex 1-39% BICA in 11/2013.  Marland Kitchen Generalized anxiety disorder   . Headache    a. Possible tension HA versus migraine variant.  . Hyperlipidemia   . Hypertension    a. Labile BP.  Marland Kitchen Hyponatremia   . TIA (transient ischemic attack)     Past Surgical History:  Procedure Laterality Date  . CATARACT EXTRACTION    . [place]  . CHOLECYSTECTOMY    . WRIST SURGERY  2007   Right    Social History   Socioeconomic History  . Marital status: Widowed    Spouse name: Not on file  . Number of children: 1  . Years of education: Not on file  . Highest education level: Not on file  Occupational History  . Not on file  Tobacco Use  . Smoking status: Never Smoker  . Smokeless tobacco: Never Used  . Tobacco comment: Tobacco use-no  Substance and Sexual Activity  . Alcohol use: No    Alcohol/week: 0.0 standard drinks  . Drug use: No  . Sexual activity: Not on file  Other Topics Concern  . Not on file  Social History Narrative   No regular exercise. Her son lives with her.  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    Social Determinants of Health   Financial Resource Strain:   . Difficulty of Paying Living Expenses:   Food Insecurity:   . Worried About Charity fundraiser in the Last Year:   . Arboriculturist in the Last Year:   Transportation Needs:   . Film/video editor (Medical):   Marland Kitchen Lack of Transportation (Non-Medical):   Physical Activity:   . Days of Exercise per Week:   . Minutes of Exercise per Session:   Stress:   . Feeling of Stress :   Social Connections:   . Frequency of Communication with Friends and Family:   . Frequency of Social Gatherings with Friends and Family:   . Attends Religious Services:   . Active Member of Clubs or Organizations:   . Attends Archivist Meetings:   Marland Kitchen Marital Status:     Intimate Partner Violence:   . Fear of Current or Ex-Partner:   . Emotionally Abused:   Marland Kitchen Physically Abused:   . Sexually Abused:     Orson Slick, LPN was present throughout the entirety of the encounter.  Vitals:   11/14/19 1048  BP: (!) 160/64  Pulse: 76  SpO2: 98%  Weight: 108 lb 12.8 oz (49.4 kg)  Height: 5\' 1"  (1.549 m)    Wt Readings from Last 3 Encounters:  11/14/19 108 lb 12.8 oz (49.4 kg)  11/06/17 122 lb (55.3 kg)  12/18/16 123 lb (55.8 kg)     PHYSICAL EXAM General: NAD HEENT: Normal. Neck: No JVD, no thyromegaly. Lungs: Clear to auscultation bilaterally with normal respiratory effort. CV: Regular rate and rhythm, normal S1/S2, no S3/S4, 2/6 ejection systolic murmur over RUSB and 2/6 pansystolic murmur heard along left sternal border. No pretibial or periankle edema.     Abdomen: Soft, nontender, no distention.  Neurologic: Alert and oriented.  Psych: Normal affect. Skin: Normal. Musculoskeletal: No gross deformities.      Labs: Lab Results  Component Value Date/Time   K 4.7 10/08/2014 03:21 PM   BUN 17 10/08/2014 03:21 PM   CREATININE 1.18 (H) 10/08/2014 03:21 PM   ALT <8 10/08/2014 03:21 PM   TSH 2.524 10/08/2014 03:21 PM   HGB 10.7 (L) 10/08/2014 03:21 PM     Lipids: Lab Results  Component Value Date/Time   LDLCALC 152 (H) 12/13/2012 05:55 AM   CHOL 237 (H) 12/13/2012 05:55 AM   TRIG 62 12/13/2012 05:55 AM   HDL 73 12/13/2012 05:55 AM       ASSESSMENT AND PLAN:  1.  Valvular heart disease, dyspnea on exertion: Mild to moderate aortic stenosis, moderate mitral and tricuspid rotation by echocardiogram in July 2019.  I will obtain a follow-up echocardiogram.  2.  Labile hypertension: This is neurologically mediated. Her blood pressures fluctuate but I reviewed her blood pressure log with several systolic readings in the [id number] range.  No changes to therapy today.   Disposition: Follow up virtual visit 2 months   Kate Sable,  M.D., F.A.C.C.

## 2019-11-14 NOTE — Telephone Encounter (Signed)
  Precert needed for:  Echo   Location:  [facility]    Date:  December 17, 2019

## 2019-12-15 DIAGNOSIS — M67921 Unspecified disorder of synovium and tendon, right upper arm: Secondary | ICD-10-CM | POA: Insufficient documentation

## 2019-12-17 ENCOUNTER — Ambulatory Visit (INDEPENDENT_AMBULATORY_CARE_PROVIDER_SITE_OTHER): Payer: Medicare Other

## 2019-12-17 ENCOUNTER — Other Ambulatory Visit: Payer: Self-pay

## 2019-12-17 DIAGNOSIS — I350 Nonrheumatic aortic (valve) stenosis: Secondary | ICD-10-CM

## 2019-12-25 ENCOUNTER — Telehealth: Payer: Self-pay | Admitting: *Deleted

## 2019-12-25 NOTE — Telephone Encounter (Signed)
-----   Message from Herminio Commons, MD sent at 12/18/2019 10:58 AM EDT ----- Pumping function is normal. Moderate to severe aortic valve narrowing. Please repeat in 6 months. While this needs to be followed, I don't think this is the sole reason for her shortness of breath. I would recommend proceeding with coronary CT angiography.

## 2019-12-25 NOTE — Telephone Encounter (Signed)
Laurine Blazer, LPN  1/0/3128 1:18 PM EDT Back to Top    Notified, copy to pcp.

## 2020-01-07 NOTE — Progress Notes (Signed)
Cardiology Office Note  Date: 01/08/2020   ID: Kathryn Gates, Kathryn Gates February 26, 1930, MRN [id number]  PCP:  Curlene Labrum, MD  Cardiologist:  Carlyle Dolly, MD Electrophysiologist:  None   Chief Complaint: F/U DOE, AS, HTN,   History of Present Illness: Kathryn Gates is a 86 y.o. female with a history of DOE, HTN, AS, Carotid artery disease, TIA, GAD.  Previous echocardiogram 01/09/2018 LVEF 60 to 65%, grade 1 DD, mild to moderate aortic stenosis, moderate mitral and tricuspid regurgitation.  Last encounter with Dr. Bronson Ing 11/14/2019.  She had been experiencing progressive exertional dyspnea over the previous several months which was concerning to her.  She denied any exertional chest pain or leg swelling.  Blood pressures tended to fluctuate but recent log with several systolic readings in the 546 range.  Follow-up echocardiogram was ordered.  Recent echocardiogram 12/17/2019: LVEF 60 to 65%, no R WMA's, grade 1 DD, mild MR, moderate to severe aortic stenosis.  Aortic valve mean gradient 23 mmHg.  AVA by VTI 1.09 cm.  She presents for follow-up.  We discussed the results of her echocardiogram showing moderate to severe aortic stenosis.  Patient states she does have shortness of breath on exertion which has gradually increased over the last few months.  States she usually does not noticed shortness of breath until she reaches her destination and recovers easily from the shortness of breath within a minute of so.  She denies any anginal or exertional symptoms, orthostatic symptoms/dizziness/presyncope, syncopal episodes.  She denies any palpitations or arrhythmias, CVA or TIA-like symptoms, PND or orthopnea but does sleep elevated on 2 pillows.  Denies any bleeding in stool or urine.  No claudication-like symptoms, DVT or PE-like symptoms, or lower extremity edema.  We discussed possibility of referral to valve clinic for evaluation.  Past Medical History:  Diagnosis Date  . Acoustic  neuroma ([place])   . Aortic regurgitation    a. Seen on 2D echo in 2013 (moderate) but not mentioned in 11/2013 echo.  . Aortic stenosis    a. 2D Echo 11/2013: mild AS.  Marland Kitchen Carotid stenosis    a. Duplex 1-39% BICA in 11/2013.  Marland Kitchen Generalized anxiety disorder   . Headache    a. Possible tension HA versus migraine variant.  . Hyperlipidemia   . Hypertension    a. Labile BP.  Marland Kitchen Hyponatremia   . TIA (transient ischemic attack)     Past Surgical History:  Procedure Laterality Date  . CATARACT EXTRACTION    . [place]  . CHOLECYSTECTOMY    . WRIST SURGERY  2007   Right    Current Outpatient Medications  Medication Sig Dispense Refill  . aspirin EC 81 MG tablet Take 81 mg by mouth daily.    . busPIRone (BUSPAR) 5 MG tablet Take 2.5 mg by mouth every 8 (eight) hours as needed.     . ezetimibe (ZETIA) 10 MG tablet Take 1 tablet (10 mg total) by mouth daily. 7 tablet 0  . fluticasone (FLONASE) 50 MCG/ACT nasal spray Place 2 sprays into both nostrils daily.    Marland Kitchen loratadine (CLARITIN) 10 MG tablet Take 10 mg by mouth daily as needed.    . Multiple Vitamin (MULTIVITAMIN) capsule Take 1 capsule by mouth daily.    . nitroGLYCERIN (NITROSTAT) 0.4 MG SL tablet Place 1 tablet (0.4 mg total) under the tongue every 5 (five) minutes as needed for chest pain. 25 tablet 3  . olmesartan (BENICAR) 20 MG  tablet Take 20 mg by mouth as needed.  1  . olmesartan (BENICAR) 5 MG tablet Take 5 mg by mouth daily.    . RABEprazole (ACIPHEX) 20 MG tablet Take 20 mg by mouth daily.     No current facility-administered medications for this visit.   Allergies:  Patient has no known allergies.   Social History: The patient  reports that she has never smoked. She has never used smokeless tobacco. She reports that she does not drink alcohol and does not use drugs.   Family History: The patient's family history includes Heart failure in an other family member; Stroke in an other family member.   ROS:   Please see the history of present illness. Otherwise, complete review of systems is positive for none.  All other systems are reviewed and negative.   Physical Exam: VS:  BP (!) 160/64   Pulse 72   Ht 5\' 1"  (1.549 m)   Wt 108 lb 9.6 oz (49.3 kg)   SpO2 94%   BMI 20.52 kg/m , BMI Body mass index is 20.52 kg/m.  Wt Readings from Last 3 Encounters:  01/08/20 108 lb 9.6 oz (49.3 kg)  11/14/19 108 lb 12.8 oz (49.4 kg)  11/06/17 122 lb (55.3 kg)    General: Patient appears comfortable at rest. Neck: Supple, no elevated JVP or carotid bruits, no thyromegaly. Lungs: Clear to auscultation, nonlabored breathing at rest. Cardiac: Regular rate and rhythm, no S3 3/6 significant systolic murmur heard at all auscultation points, no pericardial rub. Extremities: No pitting edema, distal pulses 2+. Skin: Warm and dry. Musculoskeletal: No kyphosis. Neuropsychiatric: Alert and oriented x3, affect grossly appropriate.  ECG:    Recent Labwork: No results found for requested labs within last 8760 hours.     Component Value Date/Time   CHOL 237 (H) 12/13/2012 0555   TRIG 62 12/13/2012 0555   HDL 73 12/13/2012 0555   CHOLHDL 3.2 12/13/2012 0555   VLDL 12 12/13/2012 0555   LDLCALC 152 (H) 12/13/2012 0555    Other Studies Reviewed Today:  Echocardiogram 12/17/2019  1. Left ventricular ejection fraction, by estimation, is 60 to 65%. The left ventricle has normal function. The left ventricle has no regional wall motion abnormalities. Left ventricular diastolic parameters are consistent with Grade I diastolic dysfunction (impaired relaxation). 2. Right ventricular systolic function is normal. The right ventricular size is normal. There is normal pulmonary artery systolic pressure. The estimated right ventricular systolic pressure is 42.5 mmHg. 3. The mitral valve is grossly normal. Mild mitral valve regurgitation. 4. The aortic valve is tricuspid, moderately calcified with severely reduced cusp  excursion. Aortic valve regurgitation is mild. Moderate to severe aortic valve stenosis. Aortic valve area, by VTI measures 1.09 cm. Aortic valve mean gradient measures 23.0 mmHg. Aortic valve Vmax measures 3.09 m/s. Dimentionless index 0.35. 5. The inferior vena cava is normal in size with greater than 50% respiratory variability, suggesting right atrial pressure of 3 mmHg.  Assessment and Plan:  1. DOE (dyspnea on exertion)   2. Nonrheumatic aortic valve stenosis   3. Essential hypertension    1. DOE (dyspnea on exertion) Patient states she continues have some dyspnea on exertion.  States she usually does not notice the shortness of breath until she reaches her destination.  States she recovers the shortness of breath fairly quickly in the matter of minutes.  She denies any recent nitroglycerin use.  Continue nitroglycerin as needed  2. Nonrheumatic aortic valve stenosis Recent echocardiogram 12/17/2019 showed EF  60 to 65%, grade 1 DD, mild MR, moderate to severe aortic stenosis.  AVI by VTI 1.09 cm.  Aortic mean gradient 23 mmHg.  Discussed having patient go to be evaluated by valve clinic.  She denies any significant symptoms other than some mild dyspnea on exertion increased over the last few months.  Otherwise denies any presyncopal or syncopal symptoms, or anginal symptoms.  Since her symptoms are mild at the moment.  We will defer sending her to valve clinic.  We will follow-up in 6 months and if symptoms have progressed we will order a follow-up echo and send her to be evaluated by valve clinic.  3. Essential hypertension Blood pressure is elevated today at 160/64.  Patient states anytime she goes to the doctor her blood pressure tends to go up.  She states she has records of blood pressures at home which he takes every day which she describes as being around 130/65 on average.  Continue olmesartan 5 mg daily.  Patient has an as needed dose of Benicar 20 mg ordered by PCP 4 days when  blood pressure is elevated.  Patient states she has very rarely used the as needed dose.   Medication Adjustments/Labs and Tests Ordered: Current medicines are reviewed at length with the patient today.  Concerns regarding medicines are outlined above.   Disposition: Follow-up with Dr. Harl Bowie or APP 6 months  Signed, Levell July, NP 01/08/2020 12:00 PM    [facility] [place], [place],  [address] Phone: [phone]; Fax: [phone]

## 2020-01-08 ENCOUNTER — Encounter: Payer: Self-pay | Admitting: Family Medicine

## 2020-01-08 ENCOUNTER — Ambulatory Visit (INDEPENDENT_AMBULATORY_CARE_PROVIDER_SITE_OTHER): Payer: Medicare Other | Admitting: Family Medicine

## 2020-01-08 VITALS — BP 160/64 | HR 72 | Ht 61.0 in | Wt 108.6 lb

## 2020-01-08 DIAGNOSIS — I10 Essential (primary) hypertension: Secondary | ICD-10-CM

## 2020-01-08 DIAGNOSIS — I350 Nonrheumatic aortic (valve) stenosis: Secondary | ICD-10-CM | POA: Diagnosis not present

## 2020-01-08 DIAGNOSIS — R0600 Dyspnea, unspecified: Secondary | ICD-10-CM | POA: Diagnosis not present

## 2020-01-08 DIAGNOSIS — R0609 Other forms of dyspnea: Secondary | ICD-10-CM

## 2020-01-08 NOTE — Patient Instructions (Addendum)

## 2020-02-03 ENCOUNTER — Telehealth: Payer: Medicare Other | Admitting: Cardiovascular Disease

## 2020-02-05 DIAGNOSIS — R531 Weakness: Secondary | ICD-10-CM | POA: Insufficient documentation

## 2020-02-12 DIAGNOSIS — F339 Major depressive disorder, recurrent, unspecified: Secondary | ICD-10-CM | POA: Insufficient documentation

## 2020-03-31 DIAGNOSIS — K2970 Gastritis, unspecified, without bleeding: Secondary | ICD-10-CM | POA: Insufficient documentation

## 2020-04-29 ENCOUNTER — Encounter: Payer: Self-pay | Admitting: Internal Medicine

## 2020-06-10 ENCOUNTER — Ambulatory Visit: Payer: Medicare Other | Admitting: Internal Medicine

## 2020-06-30 ENCOUNTER — Other Ambulatory Visit: Payer: Self-pay

## 2020-06-30 ENCOUNTER — Encounter: Payer: Self-pay | Admitting: Internal Medicine

## 2020-06-30 ENCOUNTER — Ambulatory Visit (INDEPENDENT_AMBULATORY_CARE_PROVIDER_SITE_OTHER): Payer: Medicare Other | Admitting: Internal Medicine

## 2020-06-30 DIAGNOSIS — R432 Parageusia: Secondary | ICD-10-CM

## 2020-06-30 DIAGNOSIS — R634 Abnormal weight loss: Secondary | ICD-10-CM

## 2020-06-30 DIAGNOSIS — R1310 Dysphagia, unspecified: Secondary | ICD-10-CM | POA: Diagnosis not present

## 2020-06-30 NOTE — Patient Instructions (Signed)
We will schedule you for EGD to further evaluate your symptoms.  Continue on AcipHex for chronic reflux.  Further recommendations to follow.  At [facility] we value your feedback. You may receive a survey about your visit today. Please share your experience as we strive to create trusting relationships with our patients to provide genuine, compassionate, quality care.  We appreciate your understanding and patience as we review any laboratory studies, imaging, and other diagnostic tests that are ordered as we care for you. Our office policy is 5 business days for review of these results, and any emergent or urgent results are addressed in a timely manner for your best interest. If you do not hear from our office in 1 week, please contact us.   We also encourage the use of MyChart, which contains your medical information for your review as well. If you are not enrolled in this feature, an access code is on this after visit summary for your convenience. Thank you for allowing [place] to be involved in your care.  It was great to see you today!  I hope you have a great rest of your winter!!    Hennie Duos. Marletta Lor, D.O. Gastroenterology and Hepatology [facility]

## 2020-07-01 DIAGNOSIS — R634 Abnormal weight loss: Secondary | ICD-10-CM | POA: Insufficient documentation

## 2020-07-01 DIAGNOSIS — R1310 Dysphagia, unspecified: Secondary | ICD-10-CM | POA: Insufficient documentation

## 2020-07-01 DIAGNOSIS — R432 Parageusia: Secondary | ICD-10-CM | POA: Insufficient documentation

## 2020-07-01 NOTE — Progress Notes (Signed)
Primary Care Physician:  Juliette Alcide, MD Primary Gastroenterologist:  Dr. Marletta Lor  Chief Complaint  Patient presents with  . Weight Loss    Poor appetite    HPI:   Kathryn Gates is a 87 y.o. female who presents to the clinic today by referral from her PCP Dr. Leandrew Koyanagi for evaluation.  Patient states this approximately 1 month ago she lost her ability to taste.  She denies ever having Covid.  States she is vaccinated and boosted.  States food "does not taste good."  Also has lack of appetite and intermittent dysphagia to solids.  States this is very rare, however.  No abdominal pain or chest pain.  Does not take any medications for this.  No relieving factors.  No previous upper endoscopy.  Also notes weight loss since her appetite is decreased.  This is unintentional.  No melena hematochezia.  No history of H. pylori PUD that she knows of. Past Medical History:  Diagnosis Date  . Acoustic neuroma ([facility])   . Aortic regurgitation    a. Seen on 2D echo in 2013 (moderate) but not mentioned in 11/2013 echo.  . Aortic stenosis    a. 2D Echo 11/2013: mild AS.  Marland Kitchen Carotid stenosis    a. Duplex 1-39% BICA in 11/2013.  Marland Kitchen Generalized anxiety disorder   . Headache    a. Possible tension HA versus migraine variant.  . Hyperlipidemia   . Hypertension    a. Labile BP.  Marland Kitchen Hyponatremia   . TIA (transient ischemic attack)     Past Surgical History:  Procedure Laterality Date  . CATARACT EXTRACTION    . CESAREAN SECTION  1973  . CHOLECYSTECTOMY    . WRIST SURGERY  2007   Right    Current Outpatient Medications  Medication Sig Dispense Refill  . aspirin EC 81 MG tablet Take 81 mg by mouth daily.    Marland Kitchen ezetimibe (ZETIA) 10 MG tablet Take 1 tablet (10 mg total) by mouth daily. 7 tablet 0  . FLUoxetine (PROZAC) 10 MG capsule Take 10 mg by mouth daily. Takes 2.5 mg daily.    . Multiple Vitamin (MULTIVITAMIN) capsule Take 1 capsule by mouth daily.    . nitroGLYCERIN (NITROSTAT) 0.4 MG SL  tablet Place 1 tablet (0.4 mg total) under the tongue every 5 (five) minutes as needed for chest pain. 25 tablet 3  . olmesartan (BENICAR) 5 MG tablet Take 5 mg by mouth daily.    . RABEprazole (ACIPHEX) 20 MG tablet Take 20 mg by mouth daily.    . busPIRone (BUSPAR) 5 MG tablet Take 2.5 mg by mouth every 8 (eight) hours as needed.  (Patient not taking: Reported on 06/30/2020)    . fluticasone (FLONASE) 50 MCG/ACT nasal spray Place 2 sprays into both nostrils daily. (Patient not taking: Reported on 06/30/2020)    . loratadine (CLARITIN) 10 MG tablet Take 10 mg by mouth daily as needed. (Patient not taking: Reported on 06/30/2020)    . olmesartan (BENICAR) 20 MG tablet Take 20 mg by mouth as needed. (Patient not taking: Reported on 06/30/2020)  1   No current facility-administered medications for this visit.    Allergies as of 06/30/2020  . (No Known Allergies)    Family History  Problem Relation Age of Onset  . Stroke Other   . Heart failure Other        CHF    Social History   Socioeconomic History  . Marital status: Widowed  Spouse name: Not on file  . Number of children: 1  . Years of education: Not on file  . Highest education level: Not on file  Occupational History  . Not on file  Tobacco Use  . Smoking status: Never Smoker  . Smokeless tobacco: Never Used  . Tobacco comment: Tobacco use-no  Substance and Sexual Activity  . Alcohol use: No    Alcohol/week: 0.0 standard drinks  . Drug use: No  . Sexual activity: Not on file  Other Topics Concern  . Not on file  Social History Narrative   No regular exercise. Her son lives with her.  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    Social Determinants of Health   Financial Resource Strain: Not on file  Food Insecurity: Not on file  Transportation Needs: Not on file  Physical Activity: Not on file  Stress: Not on file  Social Connections: Not on file  Intimate Partner Violence: Not on file    Subjective: Review of Systems   Constitutional: Positive for weight loss. Negative for chills and fever.  HENT: Negative for congestion and hearing loss.   Eyes: Negative for blurred vision and double vision.  Respiratory: Negative for cough and shortness of breath.   Cardiovascular: Negative for chest pain and palpitations.  Gastrointestinal: Negative for abdominal pain, blood in stool, constipation, diarrhea, heartburn, melena and vomiting.       Loss of taste, dysphagia  Genitourinary: Negative for dysuria and urgency.  Musculoskeletal: Negative for joint pain and myalgias.  Skin: Negative for itching and rash.  Neurological: Negative for dizziness and headaches.  Psychiatric/Behavioral: Negative for depression. The patient is not nervous/anxious.        Objective: BP (!) 202/82   Pulse 93   Temp (!) 97.3 F (36.3 C) (Temporal)   Ht 5\' 1"  (1.549 m)   Wt 102 lb 9.6 oz (46.5 kg)   BMI 19.39 kg/m  Physical Exam Constitutional:      Appearance: Normal appearance.  HENT:     Head: Normocephalic and atraumatic.  Eyes:     Extraocular Movements: Extraocular movements intact.     Conjunctiva/sclera: Conjunctivae normal.  Cardiovascular:     Rate and Rhythm: Normal rate and regular rhythm.     Heart sounds: Murmur heard.    Pulmonary:     Effort: Pulmonary effort is normal.     Breath sounds: Normal breath sounds.  Abdominal:     General: Bowel sounds are normal.     Palpations: Abdomen is soft.  Musculoskeletal:        General: No swelling. Normal range of motion.     Cervical back: Normal range of motion and neck supple.  Skin:    General: Skin is warm and dry.     Coloration: Skin is not jaundiced.  Neurological:     General: No focal deficit present.     Mental Status: She is alert and oriented to person, place, and time.  Psychiatric:        Mood and Affect: Mood normal.        Behavior: Behavior normal.      Assessment: *Loss of taste *Dysphagia *Weight loss  Plan: Etiology of  patient's symptoms unclear. Will schedule for EGD to evaluate for peptic ulcer disease, esophagitis, gastritis, H. Pylori, duodenitis, or other. Will also evaluate for esophageal stricture, Schatzki's ring, esophageal web or other.   The risks including infection, bleed, or perforation as well as benefits, limitations, alternatives and imponderables have been reviewed with  the patient. Potential for esophageal dilation, biopsy, etc. have also been reviewed.  Questions have been answered. All parties agreeable.  Continue on AcipHex twice daily.  Further recommendations to follow.  07/01/2020 1:44 PM   Disclaimer: This note was dictated with voice recognition software. Similar sounding words can inadvertently be transcribed and may not be corrected upon review.

## 2020-07-14 ENCOUNTER — Telehealth: Payer: Self-pay | Admitting: Internal Medicine

## 2020-07-14 NOTE — Telephone Encounter (Signed)
Pt wants to cancelled her procedure with Dr Abbey Chatters on 2/15 due to increase of covid cases.

## 2020-07-14 NOTE — Telephone Encounter (Signed)
Called pt, she doesn't wasn't to reschedule procedure at this time. Advised her to let us know when she is ready for procedure.  Endo scheduler informed. FYI to Dr. Abbey Chatters.

## 2020-07-19 NOTE — Telephone Encounter (Signed)
Noted, thanks!

## 2020-08-06 ENCOUNTER — Other Ambulatory Visit (HOSPITAL_COMMUNITY): Payer: Medicare Other

## 2020-08-10 ENCOUNTER — Ambulatory Visit (HOSPITAL_COMMUNITY): Admit: 2020-08-10 | Payer: Medicare Other

## 2020-08-10 ENCOUNTER — Encounter (HOSPITAL_COMMUNITY): Payer: Self-pay

## 2020-08-10 SURGERY — ESOPHAGOGASTRODUODENOSCOPY (EGD) WITH PROPOFOL
Anesthesia: Monitor Anesthesia Care

## 2020-09-22 ENCOUNTER — Encounter: Payer: Self-pay | Admitting: *Deleted

## 2020-09-22 ENCOUNTER — Ambulatory Visit (INDEPENDENT_AMBULATORY_CARE_PROVIDER_SITE_OTHER): Payer: Medicare Other | Admitting: Cardiology

## 2020-09-22 ENCOUNTER — Encounter: Payer: Self-pay | Admitting: Cardiology

## 2020-09-22 VITALS — BP 144/60 | HR 76 | Ht 61.0 in | Wt 101.4 lb

## 2020-09-22 DIAGNOSIS — I6523 Occlusion and stenosis of bilateral carotid arteries: Secondary | ICD-10-CM

## 2020-09-22 DIAGNOSIS — I350 Nonrheumatic aortic (valve) stenosis: Secondary | ICD-10-CM

## 2020-09-22 NOTE — Patient Instructions (Signed)
Your physician recommends that you schedule a follow-up appointment in: [place]  Your physician recommends that you continue on your current medications as directed. Please refer to the Current Medication list given to you today.  Your physician has requested that you have a carotid duplex. This test is an ultrasound of the carotid arteries in your neck. It looks at blood flow through these arteries that supply the brain with blood. Allow one hour for this exam. There are no restrictions or special instructions.  Thank you for choosing [facility]!!

## 2020-09-22 NOTE — Progress Notes (Signed)
Clinical Summary Ms. Sleeper is a 87 y.o.female former patient of Dr Wandra Scot, this is our first visit together.  1. Moderate aortic stenosis 11/2019 echo LVEF 60-65%, grade I dd, mild MR, mild AI, mod AS AVA VTI 1.09 mean grad 23 DI 0.35 - no recent chest pain, no SOB   2. Labile HTN - from Dr [place] Joy notes thought to be neurolofigically mediated labile HTN - compliant with meds. BP's variable at home, SBPs 130s-150s  3. Anxiety   4. Carotid stenosis - mild by prior carotid US  5. History of TIA  6. Hyperlipidema - she is on zetia - unclear statin history, she believes caused muscle aches  Past Medical History:  Diagnosis Date  . Acoustic neuroma ([place])   . Aortic regurgitation    a. Seen on 2D echo in 2013 (moderate) but not mentioned in 11/2013 echo.  . Aortic stenosis    a. 2D Echo 11/2013: mild AS.  Marland Kitchen Carotid stenosis    a. Duplex 1-39% BICA in 11/2013.  Marland Kitchen Generalized anxiety disorder   . Headache    a. Possible tension HA versus migraine variant.  . Hyperlipidemia   . Hypertension    a. Labile BP.  Marland Kitchen Hyponatremia   . TIA (transient ischemic attack)      No Known Allergies   Current Outpatient Medications  Medication Sig Dispense Refill  . aspirin EC 81 MG tablet Take 81 mg by mouth daily.    . busPIRone (BUSPAR) 5 MG tablet Take 2.5 mg by mouth every 8 (eight) hours as needed.  (Patient not taking: Reported on 06/30/2020)    . ezetimibe (ZETIA) 10 MG tablet Take 1 tablet (10 mg total) by mouth daily. 7 tablet 0  . FLUoxetine (PROZAC) 10 MG capsule Take 10 mg by mouth daily. Takes 2.5 mg daily.    . fluticasone (FLONASE) 50 MCG/ACT nasal spray Place 2 sprays into both nostrils daily. (Patient not taking: Reported on 06/30/2020)    . loratadine (CLARITIN) 10 MG tablet Take 10 mg by mouth daily as needed. (Patient not taking: Reported on 06/30/2020)    . Multiple Vitamin (MULTIVITAMIN) capsule Take 1 capsule by mouth daily.    . nitroGLYCERIN (NITROSTAT)  0.4 MG SL tablet Place 1 tablet (0.4 mg total) under the tongue every 5 (five) minutes as needed for chest pain. 25 tablet 3  . olmesartan (BENICAR) 20 MG tablet Take 20 mg by mouth as needed. (Patient not taking: Reported on 06/30/2020)  1  . olmesartan (BENICAR) 5 MG tablet Take 5 mg by mouth daily.    . RABEprazole (ACIPHEX) 20 MG tablet Take 20 mg by mouth daily.     No current facility-administered medications for this visit.     Past Surgical History:  Procedure Laterality Date  . CATARACT EXTRACTION    . [place]  . CHOLECYSTECTOMY    . WRIST SURGERY  2007   Right     No Known Allergies    Family History  Problem Relation Age of Onset  . Stroke Other   . Heart failure Other        CHF     Social History Ms. Sakamoto reports that she has never smoked. She has never used smokeless tobacco. Ms. Hamme reports no history of alcohol use.   Review of Systems CONSTITUTIONAL: No weight loss, fever, chills, weakness or fatigue.  HEENT: Eyes: No visual loss, blurred vision, double vision or yellow sclerae.No hearing loss, sneezing,  congestion, runny nose or sore throat.  SKIN: No rash or itching.  CARDIOVASCULAR: per hpi RESPIRATORY: No shortness of breath, cough or sputum.  GASTROINTESTINAL: No anorexia, nausea, vomiting or diarrhea. No abdominal pain or blood.  GENITOURINARY: No burning on urination, no polyuria NEUROLOGICAL: No headache, dizziness, syncope, paralysis, ataxia, numbness or tingling in the extremities. No change in bowel or bladder control.  MUSCULOSKELETAL: No muscle, back pain, joint pain or stiffness.  LYMPHATICS: No enlarged nodes. No history of splenectomy.  PSYCHIATRIC: No history of depression or anxiety.  ENDOCRINOLOGIC: No reports of sweating, cold or heat intolerance. No polyuria or polydipsia.  Marland Kitchen   Physical Examination Today's Vitals   09/22/20 0956  BP: (!) 144/60  Pulse: 76  Weight: 101 lb 6.4 oz (46 kg)  Height: 5\' 1"  (1.549  m)   Body mass index is 19.16 kg/m.  Gen: resting comfortably, no acute distress HEENT: no scleral icterus, pupils equal round and reactive, no palptable cervical adenopathy,  CV: RRR, 3/6 systolic murmur rusb, no jvd Resp: Clear to auscultation bilaterally GI: abdomen is soft, non-tender, non-distended, normal bowel sounds, no hepatosplenomegaly MSK: extremities are warm, no edema.  Skin: warm, no rash Neuro:  no focal deficits Psych: appropriate affect     Assessment and Plan  1. Aortic stenosis  moderate by most recent US, no significant symptoms - recheck [place] later this year  2. Carotid stenosis - repeat carotis UD   F/u 6 months   Arnoldo Lenis, M.D.,

## 2020-10-02 ENCOUNTER — Encounter: Payer: Self-pay | Admitting: Family Medicine

## 2020-10-19 ENCOUNTER — Ambulatory Visit: Payer: Medicare Other | Admitting: Family Medicine

## 2020-10-28 ENCOUNTER — Ambulatory Visit (INDEPENDENT_AMBULATORY_CARE_PROVIDER_SITE_OTHER): Payer: Medicare Other

## 2020-10-28 DIAGNOSIS — I6523 Occlusion and stenosis of bilateral carotid arteries: Secondary | ICD-10-CM | POA: Diagnosis not present

## 2020-11-09 ENCOUNTER — Telehealth: Payer: Self-pay | Admitting: *Deleted

## 2020-11-09 NOTE — Telephone Encounter (Signed)
-----   Message from Arnoldo Lenis, MD sent at 11/03/2020  8:50 AM EDT ----- Mild blockages bilaterally on carotid US, just something to monitor at this time   Zandra Abts MD

## 2020-11-09 NOTE — Telephone Encounter (Signed)
Pt aware.

## 2020-11-16 ENCOUNTER — Encounter: Payer: Self-pay | Admitting: *Deleted

## 2020-11-17 NOTE — Progress Notes (Signed)
Cardiology Office Note  Date: 11/18/2020   ID: Kathryn, Gates 1929-10-28, MRN [id number]  PCP:  Curlene Labrum, MD  Cardiologist:  Carlyle Dolly, MD Electrophysiologist:  None   Chief Complaint: Hospital follow-up vertigo, bradycardia, hyponatremia, weakness, hypotension  History of Present Illness: Kathryn Gates is a 87 y.o. female with a history of DOE, HTN, AS, Carotid artery disease, TIA, GAD.  Previous echocardiogram 01/09/2018 LVEF 60 to 65%, grade 1 DD, mild to moderate aortic stenosis, moderate mitral and tricuspid regurgitation.  Last encounter with Dr. Bronson Ing 11/14/2019.  She had been experiencing progressive exertional dyspnea over the previous several months which was concerning to her.  She denied any exertional chest pain or leg swelling.  Blood pressures tended to fluctuate but recent log with several systolic readings in the 858 range.  Follow-up echocardiogram was ordered.  Recent echocardiogram 12/17/2019: LVEF 60 to 65%, no R WMA's, grade 1 DD, mild MR, moderate to severe aortic stenosis.  Aortic valve mean gradient 23 mmHg.  AVA by VTI 1.09 cm.  She presented to [facility] emergency department on 10/02/2020 with complaint of low blood pressure, dizziness, itching/tingling sensation of her scalp.  Her systolic blood pressure was 108.  CT of the head on 10/02/2020 showed generalized cerebral atrophy, no acute intracranial abnormality.  She was discharged with diagnosis of hyponatremia and vertigo.  Sodium was 125.  She presented to next day on 10/03/2020 with weakness, hyponatremia, and hypotension as well as bradycardia.  She was admitted.  Her olmesartan was stopped and replaced with valsartan 40 mg.  Her hyponatremia had resolved prior to discharge with a sodium of 136.  She is here for hospital follow-up after recent admission for low blood pressure, bradycardia, vertigo, hyponatremia.  She denies any further episodes with low blood pressures, vertigo,  bradycardia.  Heart rate today is 72.  Blood pressure 130/70.  She states she recently saw Dr. Valli Glance her primary care provider who instructed her to stop the valsartan which was prescribed at discharge and go back on her home losartan.  She is currently taking olmesartan 20 mg daily.  She has olmesartan 2.5 mg to take as needed for BP exceeding parameters set forth by PCP.  She denies any anginal or exertional symptoms, orthostatic symptoms i.e. lightheadedness, dizziness, presyncopal or syncopal episodes.  Denies any SOB or DOE.  Denies any PND or orthopnea.  Denies any claudication-like symptoms, DVT or PE-like symptoms, lower extremity edema.   Past Medical History:  Diagnosis Date  . Acoustic neuroma ([place])   . Aortic regurgitation    a. Seen on 2D echo in 2013 (moderate) but not mentioned in 11/2013 echo.  . Aortic stenosis    a. 2D Echo 11/2013: mild AS.  Marland Kitchen Carotid stenosis    a. Duplex 1-39% BICA in 11/2013.  Marland Kitchen Generalized anxiety disorder   . Headache    a. Possible tension HA versus migraine variant.  . Hyperlipidemia   . Hypertension    a. Labile BP.  Marland Kitchen Hyponatremia   . TIA (transient ischemic attack)     Past Surgical History:  Procedure Laterality Date  . CATARACT EXTRACTION    . [place]  . CHOLECYSTECTOMY    . WRIST SURGERY  2007   Right    Current Outpatient Medications  Medication Sig Dispense Refill  . aspirin EC 81 MG tablet Take 81 mg by mouth daily.    Marland Kitchen ezetimibe (ZETIA) 10 MG tablet Take 1 tablet (  10 mg total) by mouth daily. 7 tablet 0  . FLUoxetine (PROZAC) 10 MG capsule Take 10 mg by mouth daily. Takes 2.5 mg daily.    . Multiple Vitamin (MULTIVITAMIN) capsule Take 1 capsule by mouth daily.    . nitroGLYCERIN (NITROSTAT) 0.4 MG SL tablet Place 1 tablet (0.4 mg total) under the tongue every 5 (five) minutes as needed for chest pain. 25 tablet 3  . olmesartan (BENICAR) 20 MG tablet Take 20 mg by mouth daily.    Marland Kitchen olmesartan (BENICAR) 5 MG  tablet Take 5 mg by mouth daily as needed.    . RABEprazole (ACIPHEX) 20 MG tablet Take 20 mg by mouth daily.     No current facility-administered medications for this visit.   Allergies:  Patient has no known allergies.   Social History: The patient  reports that she has never smoked. She has never used smokeless tobacco. She reports that she does not drink alcohol and does not use drugs.   Family History: The patient's family history includes Heart failure in an other family member; Stroke in an other family member.   ROS:  Please see the history of present illness. Otherwise, complete review of systems is positive for none.  All other systems are reviewed and negative.   Physical Exam: VS:  Ht 5\' 1"  (1.549 m)   Wt 97 lb (44 kg)   BMI 18.33 kg/m , BMI Body mass index is 18.33 kg/m.  Wt Readings from Last 3 Encounters:  11/18/20 97 lb (44 kg)  09/22/20 101 lb 6.4 oz (46 kg)  06/30/20 102 lb 9.6 oz (46.5 kg)    General: Patient appears comfortable at rest. Neck: Supple, no elevated JVP or carotid bruits, no thyromegaly. Lungs: Clear to auscultation, nonlabored breathing at rest. Cardiac: Regular rate and rhythm, no S3 3/6 significant systolic murmur heard at all auscultation points, no pericardial rub. Extremities: No pitting edema, distal pulses 2+. Skin: Warm and dry. Musculoskeletal: No kyphosis. Neuropsychiatric: Alert and oriented x3, affect grossly appropriate.  ECG:    Recent Labwork: No results found for requested labs within last 8760 hours.     Component Value Date/Time   CHOL 237 (H) 12/13/2012 0555   TRIG 62 12/13/2012 0555   HDL 73 12/13/2012 0555   CHOLHDL 3.2 12/13/2012 0555   VLDL 12 12/13/2012 0555   LDLCALC 152 (H) 12/13/2012 0555    Other Studies Reviewed Today:  Echocardiogram 12/17/2019  1. Left ventricular ejection fraction, by estimation, is 60 to 65%. The left ventricle has normal function. The left ventricle has no regional wall motion  abnormalities. Left ventricular diastolic parameters are consistent with Grade I diastolic dysfunction (impaired relaxation). 2. Right ventricular systolic function is normal. The right ventricular size is normal. There is normal pulmonary artery systolic pressure. The estimated right ventricular systolic pressure is 01.6 mmHg. 3. The mitral valve is grossly normal. Mild mitral valve regurgitation. 4. The aortic valve is tricuspid, moderately calcified with severely reduced cusp excursion. Aortic valve regurgitation is mild. Moderate to severe aortic valve stenosis. Aortic valve area, by VTI measures 1.09 cm. Aortic valve mean gradient measures 23.0 mmHg. Aortic valve Vmax measures 3.09 m/s. Dimentionless index 0.35. 5. The inferior vena cava is normal in size with greater than 50% respiratory variability, suggesting right atrial pressure of 3 mmHg.  Assessment and Plan:  1. DOE (dyspnea on exertion)   2. Nonrheumatic aortic valve stenosis   3. Essential hypertension    1. DOE (dyspnea on exertion)  Currently denies any DOE or SOB.  She is not very active on a daily basis.  .  She denies any recent nitroglycerin use.  Continue nitroglycerin as needed.  2. Nonrheumatic aortic valve stenosis Recent echocardiogram 12/17/2019 showed EF 60 to 65%, grade 1 DD, mild MR, moderate to severe aortic stenosis.  AVI by VTI 1.09 cm.  Aortic mean gradient 23 mmHg.  Discussed having patient go to be evaluated by valve clinic.  She denies any significant symptoms other than some mild dyspnea.  Otherwise denies any presyncopal or syncopal symptoms, or anginal symptoms.  Since her symptoms are mild at the moment, we will defer sending her to valve clinic.  We will follow-up in 6 months and if symptoms have progressed we will send her to be evaluated by valve clinic.  Please get a repeat echocardiogram  prior to follow-up with Dr. Harl Bowie  3. Essential hypertension BP today is 130/70.  Continue olmesartan 20 mg  daily  Patient states she has very rarely used the as needed dose of olmesartan which was recently adjusted down to 2.5 mg as needed by PCP.   Medication Adjustments/Labs and Tests Ordered: Current medicines are reviewed at length with the patient today.  Concerns regarding medicines are outlined above.   Disposition: Follow-up with Dr. Harl Bowie or APP 6 months  Signed, Levell July, NP 11/18/2020 11:01 AM    [facility] [place], [place], [place] [address] Phone: [phone]; Fax: [phone]

## 2020-11-18 ENCOUNTER — Encounter: Payer: Self-pay | Admitting: Family Medicine

## 2020-11-18 ENCOUNTER — Ambulatory Visit (INDEPENDENT_AMBULATORY_CARE_PROVIDER_SITE_OTHER): Payer: Medicare Other | Admitting: Family Medicine

## 2020-11-18 VITALS — BP 130/70 | HR 72 | Ht 61.0 in | Wt 97.0 lb

## 2020-11-18 DIAGNOSIS — R0600 Dyspnea, unspecified: Secondary | ICD-10-CM | POA: Diagnosis not present

## 2020-11-18 DIAGNOSIS — I10 Essential (primary) hypertension: Secondary | ICD-10-CM | POA: Diagnosis not present

## 2020-11-18 DIAGNOSIS — I350 Nonrheumatic aortic (valve) stenosis: Secondary | ICD-10-CM | POA: Diagnosis not present

## 2020-11-18 DIAGNOSIS — R0609 Other forms of dyspnea: Secondary | ICD-10-CM

## 2020-11-18 NOTE — Patient Instructions (Addendum)

## 2021-03-09 ENCOUNTER — Encounter: Payer: Self-pay | Admitting: *Deleted

## 2021-03-09 NOTE — Progress Notes (Deleted)
Cardiology Office Note  Date: 03/09/2021   ID: Yeily, Kocur 06/04/1930, MRN [id number]  PCP:  Curlene Labrum, MD  Cardiologist:  Carlyle Dolly, MD Electrophysiologist:  None   Chief Complaint: Hospital follow-up  History of Present Illness: Kathryn Gates is a 87 y.o. female with a history of DOE, HTN, AS, Carotid artery disease, TIA, GAD.   Previous echocardiogram 01/09/2018 LVEF 60 to 65%, grade 1 DD, mild to moderate aortic stenosis, moderate mitral and tricuspid regurgitation.   Last encounter with Dr. Bronson Ing 11/14/2019.  She had been experiencing progressive exertional dyspnea over the previous several months which was concerning to her.  She denied any exertional chest pain or leg swelling.  Blood pressures tended to fluctuate but recent log with several systolic readings in the [id number] range.  Follow-up echocardiogram was ordered.   Recent echocardiogram 12/17/2019: LVEF 60 to 65%, no R WMA's, grade 1 DD, mild MR, moderate to severe aortic stenosis.  Aortic valve mean gradient 23 mmHg.  AVA by VTI 1.09 cm.   She presented to [facility] emergency department on 10/02/2020 with complaint of low blood pressure, dizziness, itching/tingling sensation of her scalp.  Her systolic blood pressure was 108.  CT of the head on 10/02/2020 showed generalized cerebral atrophy, no acute intracranial abnormality.  She was discharged with diagnosis of hyponatremia and vertigo.  Sodium was 125.  She presented to next day on 10/03/2020 with weakness, hyponatremia, and hypotension as well as bradycardia.  She was admitted.  Her olmesartan was stopped and replaced with valsartan 40 mg.  Her hyponatremia had resolved prior to discharge with a sodium of 136.   She is here for hospital follow-up after recent admission for low blood pressure, bradycardia, vertigo, hyponatremia.  She denies any further episodes with low blood pressures, vertigo, bradycardia.  Heart rate today is 72.  Blood pressure  130/70.  She states she recently saw Dr. Valli Glance her primary care provider who instructed her to stop the valsartan which was prescribed at discharge and go back on her home losartan.  She is currently taking olmesartan 20 mg daily.  She has olmesartan 2.5 mg to take as needed for BP exceeding parameters set forth by PCP.  She denies any anginal or exertional symptoms, orthostatic symptoms i.e. lightheadedness, dizziness, presyncopal or syncopal episodes.  Denies any SOB or DOE.  Denies any PND or orthopnea.  Denies any claudication-like symptoms, DVT or PE-like symptoms, lower extremity edema.  She was recently seen at [facility] ED on 02/26/2021 for hypotension and bradycardia, with generalized weakness and hyponatremia.  She stated she felt presyncopal with weakness and dizziness prior to arrival.  Blood pressure via EMS was 89/39 with bradycardia.  She responded moderately to 500 cc of IV fluids.  She received an additional 500 cc of IV fluids in the emergency.  Sodium was 132.  Troponins were normal.  ER provider believe the patient's symptoms were secondary to her aortic stenosis.  She was advised to follow-up with cardiology for reevaluation.  Her hemoglobin was 9.8, hematocrit 29.6.  Chest x-ray was negative.      Past Medical History:  Diagnosis Date   Acoustic neuroma [facility])    Aortic regurgitation    a. Seen on 2D echo in 2013 (moderate) but not mentioned in 11/2013 echo.   Aortic stenosis    a. 2D Echo 11/2013: mild AS.   Carotid stenosis    a. Duplex 1-39% BICA in 11/2013.   Generalized anxiety  disorder    Headache    a. Possible tension HA versus migraine variant.   Hyperlipidemia    Hypertension    a. Labile BP.   Hyponatremia    TIA (transient ischemic attack)     Past Surgical History:  Procedure Laterality Date   CATARACT EXTRACTION     CESAREAN SECTION  1973   CHOLECYSTECTOMY     WRIST SURGERY  2007   Right    Current Outpatient Medications  Medication Sig  Dispense Refill   aspirin EC 81 MG tablet Take 81 mg by mouth daily.     ezetimibe (ZETIA) 10 MG tablet Take 1 tablet (10 mg total) by mouth daily. 7 tablet 0   FLUoxetine (PROZAC) 10 MG capsule Take 10 mg by mouth daily. Takes 2.5 mg daily.     Multiple Vitamin (MULTIVITAMIN) capsule Take 1 capsule by mouth daily.     nitroGLYCERIN (NITROSTAT) 0.4 MG SL tablet Place 1 tablet (0.4 mg total) under the tongue every 5 (five) minutes as needed for chest pain. 25 tablet 3   olmesartan (BENICAR) 20 MG tablet Take 20 mg by mouth daily.     olmesartan (BENICAR) 5 MG tablet Take 5 mg by mouth daily as needed.     RABEprazole (ACIPHEX) 20 MG tablet Take 20 mg by mouth daily.     No current facility-administered medications for this visit.   Allergies:  Patient has no known allergies.   Social History: The patient  reports that she has never smoked. She has never used smokeless tobacco. She reports that she does not drink alcohol and does not use drugs.   Family History: The patient's family history includes Heart failure in an other family member; Stroke in an other family member.   ROS:  Please see the history of present illness. Otherwise, complete review of systems is positive for none.  All other systems are reviewed and negative.   Physical Exam: VS:  There were no vitals taken for this visit., BMI There is no height or weight on file to calculate BMI.  Wt Readings from Last 3 Encounters:  11/18/20 97 lb (44 kg)  09/22/20 101 lb 6.4 oz (46 kg)  06/30/20 102 lb 9.6 oz (46.5 kg)    General: Patient appears comfortable at rest. HEENT: Conjunctiva and lids normal, oropharynx clear with moist mucosa. Neck: Supple, no elevated JVP or carotid bruits, no thyromegaly. Lungs: Clear to auscultation, nonlabored breathing at rest. Cardiac: Regular rate and rhythm, no S3 or significant systolic murmur, no pericardial rub. Abdomen: Soft, nontender, no hepatomegaly, bowel sounds present, no guarding or  rebound. Extremities: No pitting edema, distal pulses 2+. Skin: Warm and dry. Musculoskeletal: No kyphosis. Neuropsychiatric: Alert and oriented x3, affect grossly appropriate.  ECG:  {EKG/Telemetry Strips Reviewed:[phone]}  Recent Labwork: No results found for requested labs within last 8760 hours.     Component Value Date/Time   CHOL 237 (H) 12/13/2012 0555   TRIG 62 12/13/2012 0555   HDL 73 12/13/2012 0555   CHOLHDL 3.2 12/13/2012 0555   VLDL 12 12/13/2012 0555   LDLCALC 152 (H) 12/13/2012 0555    Other Studies Reviewed Today:  Carotid artery duplex 10/28/2020 Right Carotid: Velocities in the right ICA are consistent with a 1-39% stenosis. The ECA appears >50% stenosed. Left Carotid: Velocities in the left ICA are consistent with a 1-39% stenosis. The ECA appears >50% stenosed. Vertebrals: Bilateral vertebral arteries demonstrate antegrade flow. Subclavians:Normal flow hemodynamics were seen in bilateral subclavian arteries.  Echocardiogram 12/17/2019   1. Left ventricular ejection fraction, by estimation, is 60 to 65%. The left ventricle has normal function. The left ventricle has no regional wall motion abnormalities. Left ventricular diastolic parameters are consistent with Grade I diastolic dysfunction (impaired relaxation). 2. Right ventricular systolic function is normal. The right ventricular size is normal. There is normal pulmonary artery systolic pressure. The estimated right ventricular systolic pressure is 0000000 mmHg. 3. The mitral valve is grossly normal. Mild mitral valve regurgitation. 4. The aortic valve is tricuspid, moderately calcified with severely reduced cusp excursion. Aortic valve regurgitation is mild. Moderate to severe aortic valve stenosis. Aortic valve area, by VTI measures 1.09 cm. Aortic valve mean gradient measures 23.0 mmHg. Aortic valve Vmax measures 3.09 m/s. Dimentionless index 0.35. 5. The inferior vena cava is  normal in size with greater than 50% respiratory variability, suggesting right atrial pressure of 3 mmHg.   Assessment and Plan:  1. DOE (dyspnea on exertion)   2. Nonrheumatic aortic valve stenosis   3. Essential hypertension   4. Hypotension, unspecified hypotension type       1. DOE (dyspnea on exertion) Currently denies any DOE or SOB.  She is not very active on a daily basis.  .  She denies any recent nitroglycerin use.  Continue nitroglycerin as needed.   2. Nonrheumatic aortic valve stenosis Recent echocardiogram 12/17/2019 showed EF 60 to 65%, grade 1 DD, mild MR, moderate to severe aortic stenosis.  AVI by VTI 1.09 cm.  Aortic mean gradient 23 mmHg.  Discussed having patient go to be evaluated by valve clinic.  She denies any significant symptoms other than some mild dyspnea.  Otherwise denies any presyncopal or syncopal symptoms, or anginal symptoms.  Since her symptoms are mild at the moment, we will defer sending her to valve clinic.  We will follow-up in 6 months and if symptoms have progressed we will send her to be evaluated by valve clinic.  Please get a repeat echocardiogram  prior to follow-up with Dr. Harl Bowie   3. Essential hypertension BP today is 130/70.  Continue olmesartan 20 mg daily  Patient states she has very rarely used the as needed dose of olmesartan which was recently adjusted down to 2.5 mg as needed by PCP.   4. Hypotension   Medication Adjustments/Labs and Tests Ordered: Current medicines are reviewed at length with the patient today.  Concerns regarding medicines are outlined above.   Disposition: Follow-up with ***  Signed, Levell July, NP 03/09/2021 9:56 PM    [facility] at [facility] [address], [place], [place] [address] Phone: [phone]; Fax: [phone]

## 2021-03-10 ENCOUNTER — Ambulatory Visit: Payer: Medicare Other | Admitting: Family Medicine

## 2021-03-10 DIAGNOSIS — R0600 Dyspnea, unspecified: Secondary | ICD-10-CM

## 2021-03-10 DIAGNOSIS — I10 Essential (primary) hypertension: Secondary | ICD-10-CM

## 2021-03-10 DIAGNOSIS — I959 Hypotension, unspecified: Secondary | ICD-10-CM

## 2021-03-10 DIAGNOSIS — I350 Nonrheumatic aortic (valve) stenosis: Secondary | ICD-10-CM

## 2021-03-20 NOTE — Progress Notes (Signed)
Cardiology Office Note  Date: 03/21/2021   ID: Jarita, Raval 1929-07-12, MRN [id number]  PCP:  Curlene Labrum, MD  Cardiologist:  Carlyle Dolly, MD Electrophysiologist:  None   Chief Complaint: Hospital follow-up  History of Present Illness: Kathryn Gates is a 87 y.o. female with a history of DOE, HTN, AS, Carotid artery disease, TIA, GAD.   Previous echocardiogram 01/09/2018 LVEF 60 to 65%, grade 1 DD, mild to moderate aortic stenosis, moderate mitral and tricuspid regurgitation.   Last encounter with Dr. Bronson Ing 11/14/2019.  She had been experiencing progressive exertional dyspnea over the previous several months which was concerning to her.  She denied any exertional chest pain or leg swelling.  Blood pressures tended to fluctuate but recent log with several systolic readings in the 761 range.  Follow-up echocardiogram was ordered.   Recent echocardiogram 12/17/2019: LVEF 60 to 65%, no R WMA's, grade 1 DD, mild MR, moderate to severe aortic stenosis.  Aortic valve mean gradient 23 mmHg.  AVA by VTI 1.09 cm.   She presented to [facility] emergency department on 10/02/2020 with complaint of low blood pressure, dizziness, itching/tingling sensation of her scalp.  Her systolic blood pressure was 108.  CT of the head on 10/02/2020 showed generalized cerebral atrophy, no acute intracranial abnormality.  She was discharged with diagnosis of hyponatremia and vertigo.  Sodium was 125.  She presented to next day on 10/03/2020 with weakness, hyponatremia, and hypotension as well as bradycardia.  She was admitted.  Her olmesartan was stopped and replaced with valsartan 40 mg.  Her hyponatremia had resolved prior to discharge with a sodium of 136.   She is here for hospital follow-up after recent admission for low blood pressure, bradycardia, vertigo, hyponatremia.  She denies any further episodes with low blood pressures, vertigo, bradycardia.  Heart rate today is 72.  Blood pressure  130/70.  She states she recently saw Dr. Valli Glance her primary care provider who instructed her to stop the valsartan which was prescribed at discharge and go back on her home losartan.  She is currently taking olmesartan 20 mg daily.  She has olmesartan 2.5 mg to take as needed for BP exceeding parameters set forth by PCP.  She denies any anginal or exertional symptoms, orthostatic symptoms i.e. lightheadedness, dizziness, presyncopal or syncopal episodes.  Denies any SOB or DOE.  Denies any PND or orthopnea.  Denies any claudication-like symptoms, DVT or PE-like symptoms, lower extremity edema.  She was recently seen at [facility] ED on 02/26/2021 for hypotension and bradycardia, with generalized weakness and hyponatremia.  She stated she felt presyncopal with weakness and dizziness prior to arrival.  Blood pressure via EMS was 89/39 with bradycardia.  She responded moderately to 500 cc of IV fluids.  She received an additional 500 cc of IV fluids in the emergency.  Sodium was 132.  Troponins were normal.  ER provider believe the patient's symptoms were secondary to her aortic stenosis.  She was advised to follow-up with cardiology for reevaluation.  Her hemoglobin was 9.8, hematocrit 29.6.  Chest x-ray was negative.   She presented to [facility] ED on 03/10/2021 with low BP's 80's SBP with fatigue and weakness. She had a non productive cough. She was diagnosed with Viral URI with cough. She was given 250 cc NS bolus and eventually discharged. NA was 134 Hgb 11.1 Hct 33.4. CXR normal.   She is here for follow-up for recent ED visit to [facility] for weakness, dizziness,  hypotension, bradycardia.  She states she saw Dr. Pleas Koch since recent ED visits who told her to stop all of her antihypertensive medications until seen by cardiology.  Patient states in the interim since being seen by Dr. Pleas Koch she started back on her olmesartan 20 mg daily due to blood pressures being increased.  She brings with her a log of  blood pressures recently ranging from 144 systolic to 315 systolic.  She states this was before she started back on her olmesartan.  She denies any further weakness, low blood pressures, dizziness, or slow heart rate.  Blood pressure today is 138/80, heart rate is 85.  She has an upcoming repeat echocardiogram in December to reassess aortic valve stenosis.  She has an upcoming follow-up with Dr. Harl Bowie on November 15.  Currently denies any DOE, dizziness, lightheadedness, presyncopal or syncopal episodes.  Currently denies any low blood pressures or slow heart rate.  Currently denies any weakness.   Past Medical History:  Diagnosis Date   Acoustic neuroma [facility])    Aortic regurgitation    a. Seen on 2D echo in 2013 (moderate) but not mentioned in 11/2013 echo.   Aortic stenosis    a. 2D Echo 11/2013: mild AS.   Carotid stenosis    a. Duplex 1-39% BICA in 11/2013.   Generalized anxiety disorder    Headache    a. Possible tension HA versus migraine variant.   Hyperlipidemia    Hypertension    a. Labile BP.   Hyponatremia    TIA (transient ischemic attack)     Past Surgical History:  Procedure Laterality Date   CATARACT EXTRACTION     CESAREAN SECTION  1973   CHOLECYSTECTOMY     WRIST SURGERY  2007   Right    Current Outpatient Medications  Medication Sig Dispense Refill   aspirin EC 81 MG tablet Take 81 mg by mouth daily.     ezetimibe (ZETIA) 10 MG tablet Take 1 tablet (10 mg total) by mouth daily. 7 tablet 0   olmesartan (BENICAR) 20 MG tablet Take 20 mg by mouth daily.     RABEprazole (ACIPHEX) 20 MG tablet Take 20 mg by mouth daily.     FLUoxetine (PROZAC) 10 MG capsule Take 10 mg by mouth daily. Takes 2.5 mg daily. (Patient not taking: Reported on 03/21/2021)     Multiple Vitamin (MULTIVITAMIN) capsule Take 1 capsule by mouth daily. (Patient not taking: Reported on 03/21/2021)     nitroGLYCERIN (NITROSTAT) 0.4 MG SL tablet Place 1 tablet (0.4 mg total) under the tongue every 5  (five) minutes as needed for chest pain. (Patient not taking: Reported on 03/21/2021) 25 tablet 3   olmesartan (BENICAR) 5 MG tablet Take 5 mg by mouth daily as needed. (Patient not taking: Reported on 03/21/2021)     No current facility-administered medications for this visit.   Allergies:  Patient has no known allergies.   Social History: The patient  reports that she has never smoked. She has never used smokeless tobacco. She reports that she does not drink alcohol and does not use drugs.   Family History: The patient's family history includes Heart failure in an other family member; Stroke in an other family member.   ROS:  Please see the history of present illness. Otherwise, complete review of systems is positive for none.  All other systems are reviewed and negative.   Physical Exam: VS:  BP 138/80   Pulse 85   Ht 5' (1.524 m)  Wt 97 lb 12.8 oz (44.4 kg)   SpO2 99%   BMI 19.10 kg/m , BMI Body mass index is 19.1 kg/m.  Wt Readings from Last 3 Encounters:  03/21/21 97 lb 12.8 oz (44.4 kg)  11/18/20 97 lb (44 kg)  09/22/20 101 lb 6.4 oz (46 kg)    General: Patient appears comfortable at rest. Neck: Supple, no elevated JVP or carotid bruits, no thyromegaly. Lungs: Clear to auscultation, nonlabored breathing at rest. Cardiac: Regular rate and rhythm, no S3 or significant systolic murmur, no pericardial rub. Extremities: No pitting edema, distal pulses 2+. Skin: Warm and dry. Musculoskeletal: No kyphosis. Neuropsychiatric: Alert and oriented x3, affect grossly appropriate.  ECG: EKG on 02/26/2021 at [facility] demonstrated sinus bradycardia with a rate of 50, cannot rule out anterior infarct.  When compared to previous EKG October 03, 2020 no significant change was found.  Recent Labwork: No results found for requested labs within last 8760 hours.     Component Value Date/Time   CHOL 237 (H) 12/13/2012 0555   TRIG 62 12/13/2012 0555   HDL 73 12/13/2012 0555   CHOLHDL 3.2  12/13/2012 0555   VLDL 12 12/13/2012 0555   LDLCALC 152 (H) 12/13/2012 0555    Other Studies Reviewed Today:  Carotid artery duplex 10/28/2020 Right Carotid: Velocities in the right ICA are consistent with a 1-39% stenosis. The ECA appears >50% stenosed. Left Carotid: Velocities in the left ICA are consistent with a 1-39% stenosis. The ECA appears >50% stenosed. Vertebrals: Bilateral vertebral arteries demonstrate antegrade flow. Subclavians:Normal flow hemodynamics were seen in bilateral subclavian arteries.     Echocardiogram 12/17/2019   1. Left ventricular ejection fraction, by estimation, is 60 to 65%. The left ventricle has normal function. The left ventricle has no regional wall motion abnormalities. Left ventricular diastolic parameters are consistent with Grade I diastolic dysfunction (impaired relaxation). 2. Right ventricular systolic function is normal. The right ventricular size is normal. There is normal pulmonary artery systolic pressure. The estimated right ventricular systolic pressure is 00.8 mmHg. 3. The mitral valve is grossly normal. Mild mitral valve regurgitation. 4. The aortic valve is tricuspid, moderately calcified with severely reduced cusp excursion. Aortic valve regurgitation is mild. Moderate to severe aortic valve stenosis. Aortic valve area, by VTI measures 1.09 cm. Aortic valve mean gradient measures 23.0 mmHg. Aortic valve Vmax measures 3.09 m/s. Dimentionless index 0.35. 5. The inferior vena cava is normal in size with greater than 50% respiratory variability, suggesting right atrial pressure of 3 mmHg.   Assessment and Plan:  1. DOE (dyspnea on exertion)   2. Nonrheumatic aortic valve stenosis   3. Essential hypertension   4. Hypotension, unspecified hypotension type        1. DOE (dyspnea on exertion) Currently denies any DOE or SOB.  She is not very active on a daily basis.  .  She denies any recent nitroglycerin use.  Continue  nitroglycerin as needed.   2. Nonrheumatic aortic valve stenosis Recent echocardiogram 12/17/2019 showed EF 60 to 65%, grade 1 DD, mild MR, moderate to severe aortic stenosis.  AVI by VTI 1.09 cm.  Aortic mean gradient 23 mmHg.  Previously discussed having patient go to be evaluated by valve clinic.   denies any presyncopal or syncopal symptoms, or anginal symptoms.  Since her symptoms are mild at the moment, we will defer sending her to valve clinic.  We will follow-up in 6 months and if symptoms have progressed we will send her to be evaluated  by valve clinic.  Please get a repeat echocardiogram  prior to follow-up with Dr. Harl Bowie   3. Essential hypertension BP today is 138/80 continue olmesartan 20 mg daily.  Patient recently had a visit to [facility] for weakness, low blood pressure, dizziness.  She was hypotensive and bradycardic.  She she was given IV fluids and discharged on both occasions.  Had recent follow-up with Dr. Pleas Koch who had her stop all of her antihypertensive medications until being seen by cardiology.  She states since seeing him she decided she would restart her olmesartan due to elevated blood pressures at home.  She states she is currently only taking olmesartan to 20 mg daily.  Currently not taking amlodipine or as needed olmesartan.  Advised her to continue this dosage and follow-up with Dr. Harl Bowie on November 15 to reassess her blood pressures.  4. Hypotension Recent episodes of hypotension, dizziness, bradycardia, weakness with presentation to [facility] ED on 02/26/2021 and 03/10/2021 with similar symptoms.  She was treated on both occasions with IV fluid boluses which improved her blood pressure.  He has seen her PCP since that time and she states she stopped all of her antihypertensive medications until being seen by cardiology.  Blood pressure today is 138/80.  Denies any weakness, dizziness, bradycardia.  Heart rate today is 85  Medication Adjustments/Labs and Tests  Ordered: Current medicines are reviewed at length with the patient today.  Concerns regarding medicines are outlined above.   Disposition: Follow-up with Dr. Harl Bowie on scheduled visit in November  Signed, Senna Lape Quinn, NP 03/21/2021 11:40 AM    [facility], [place], [place] [address] Phone: [phone]; Fax: [phone]

## 2021-03-21 ENCOUNTER — Ambulatory Visit (INDEPENDENT_AMBULATORY_CARE_PROVIDER_SITE_OTHER): Payer: Medicare Other | Admitting: Family Medicine

## 2021-03-21 ENCOUNTER — Encounter: Payer: Self-pay | Admitting: Family Medicine

## 2021-03-21 VITALS — BP 138/80 | HR 85 | Ht 60.0 in | Wt 97.8 lb

## 2021-03-21 DIAGNOSIS — I10 Essential (primary) hypertension: Secondary | ICD-10-CM | POA: Diagnosis not present

## 2021-03-21 DIAGNOSIS — I350 Nonrheumatic aortic (valve) stenosis: Secondary | ICD-10-CM | POA: Diagnosis not present

## 2021-03-21 DIAGNOSIS — R0600 Dyspnea, unspecified: Secondary | ICD-10-CM

## 2021-03-21 DIAGNOSIS — R0609 Other forms of dyspnea: Secondary | ICD-10-CM

## 2021-03-21 DIAGNOSIS — I959 Hypotension, unspecified: Secondary | ICD-10-CM

## 2021-03-21 NOTE — Patient Instructions (Signed)
Medication Instructions:  Your physician has recommended you make the following change in your medication:  STOP Amlodipine 5 mg tablets Take Olmesartan 5 mg tablets if top number of blood pressure is 150>  *If you need a refill on your cardiac medications before your next appointment, please call your pharmacy*   Lab Work: None If you have labs (blood work) drawn today and your tests are completely normal, you will receive your results only by: [place] (if you have MyChart) OR A paper copy in the mail If you have any lab test that is abnormal or we need to change your treatment, we will call you to review the results.   Testing/Procedures: None   Follow-Up: At [facility], you and your health needs are our priority.  As part of our continuing mission to provide you with exceptional heart care, we have created designated Provider Care Teams.  These Care Teams include your primary Cardiologist (physician) and Advanced Practice Providers (APPs -  Physician Assistants and Nurse Practitioners) who all work together to provide you with the care you need, when you need it.  We recommend signing up for the patient portal called "MyChart".  Sign up information is provided on this After Visit Summary.  MyChart is used to connect with patients for Virtual Visits (Telemedicine).  Patients are able to view lab/test results, encounter notes, upcoming appointments, etc.  Non-urgent messages can be sent to your provider as well.   To learn more about what you can do with MyChart, go to [email or web address].    Your next appointment:   Keep follow up appointments as scheduled.    Other Instructions

## 2021-03-22 ENCOUNTER — Ambulatory Visit: Payer: Medicare Other | Admitting: Cardiology

## 2021-03-24 ENCOUNTER — Telehealth: Payer: Self-pay | Admitting: Cardiology

## 2021-03-24 NOTE — Telephone Encounter (Signed)
    Patient Name: Kathryn Gates  DOB: 1930-03-08 MRN: [id number]  Primary Cardiologist: Carlyle Dolly, MD  Chart reviewed as part of pre-operative protocol coverage.   Patient with history of recent multiple episode of hypotension.  Blood pressure was better when seen in clinic 3 days ago.  Patient also has mild to moderate aortic stenosis.  No physical activity.  Will obtain recommendation regarding surgical clearance from Dr. Harl Bowie.  [place], [place] 03/24/2021, 2:42 PM

## 2021-03-24 NOTE — Telephone Encounter (Signed)
   [facility] Pre-operative Risk Assessment    Patient Name: QUAMESHA MULLET  DOB: 09-Nov-1929 MRN: [id number]  HEARTCARE STAFF:  - IMPORTANT!!!!!! Under Visit Info/Reason for Call, type in Other and utilize the format Clearance MM/DD/YY or Clearance TBD. Do not use dashes or single digits. - Please review there is not already an duplicate clearance open for this procedure. - If request is for dental extraction, please clarify the # of teeth to be extracted. - If the patient is currently at the dentist's office, call Pre-Op Callback Staff (MA/nurse) to input urgent request.  - If the patient is not currently in the dentist office, please route to the Pre-Op pool.  Request for surgical clearance:  What type of surgery is being performed? Right Salpingo-oophorectomy  When is this surgery scheduled? TBD  What type of clearance is required (medical clearance vs. Pharmacy clearance to hold med vs. Both)? Medical   Are there any medications that need to be held prior to surgery and how long? Not noted   Practice name and name of physician performing surgery? [facility]. Leontine Locket, MD  What is the office phone number? [phone]   7.   What is the office fax number? [phone]  8.   Anesthesia type (None, local, MAC, general) ? Not noted    Chanda Busing 03/24/2021, 1:49 PM  _________________________________________________________________   (provider comments below)

## 2021-03-29 NOTE — Telephone Encounter (Signed)
Would go ahead and repeat her echo prior to surgery. Edd Fabian can we go ahead and have her get her echo, already has order   Zandra Abts MD

## 2021-03-29 NOTE — Telephone Encounter (Signed)
Left message to return call.  Echo originally scheduled for 05/11/2021 - moved up to 05/05/2021 in [place] office.

## 2021-03-30 NOTE — Telephone Encounter (Signed)
   Patient Name: Kathryn Gates  DOB: 1929-10-02 MRN: [id number]  Primary Cardiologist: Carlyle Dolly, MD  Chart reviewed as part of pre-operative protocol coverage. As outlined below, patient recently seen in office with multiple episodes of hypotension. Dr. Harl Bowie recommends obtaining echocardiogram prior to clearing for surgery. This is tentatively scheduled for 05/05/21 per notes below. Will route to surgeon so they are aware this is pending, at which time further recommendations are forthcoming.   Charlie Pitter, PA-C 03/30/2021, 8:40 AM

## 2021-04-14 ENCOUNTER — Ambulatory Visit (INDEPENDENT_AMBULATORY_CARE_PROVIDER_SITE_OTHER): Payer: Medicare Other

## 2021-04-14 ENCOUNTER — Other Ambulatory Visit: Payer: Self-pay

## 2021-04-14 DIAGNOSIS — I350 Nonrheumatic aortic (valve) stenosis: Secondary | ICD-10-CM

## 2021-04-14 LAB — ECHOCARDIOGRAM COMPLETE
AR max vel: 0.96 cm2
AV Area VTI: 0.9 cm2
AV Area mean vel: 0.9 cm2
AV Mean grad: 31 mmHg
AV Peak grad: 43.8 mmHg
AV Vena cont: 0.43 cm
Ao pk vel: 3.31 m/s
Area-P 1/2: 2.82 cm2
Calc EF: 69.7 %
P 1/2 time: 1000 msec
S' Lateral: 2.42 cm
Single Plane A2C EF: 69 %
Single Plane A4C EF: 68.7 %

## 2021-04-15 ENCOUNTER — Telehealth: Payer: Self-pay | Admitting: *Deleted

## 2021-04-15 NOTE — Telephone Encounter (Signed)
Patient informed. Copy sent to PCP °

## 2021-04-15 NOTE — Telephone Encounter (Signed)
-----   Message from Laurine Blazer, LPN sent at 45/99/7741  3:54 PM EDT -----  ----- Message ----- From: Verta Ellen., NP Sent: 04/14/2021  11:27 PM EDT To: Laurine Blazer, LPN, Arnoldo Lenis, MD  Please call the patient and let her know the echocardiogram shows she has good pumping function of the heart.  The main pumping chamber is a little stiff which is age-related.  She had a mild leak in her mitral valve also age-related.  Her aortic stenosis is considered moderate to severe.  Tell her I will route the results of the echocardiogram to her primary cardiologist Dr. Harl Bowie.  She is pending surgery for adnexal mass.   Verta Ellen, NP  04/14/2021 11:21 PM

## 2021-04-19 NOTE — Telephone Encounter (Signed)
Covering preop today. 2D echo was performed 04/14/21 and Katina Dung indicated he was routing to Dr. Harl Bowie for review. Will route to [place] and Dr. Harl Bowie to please advice on preop clearance for right Salpingo-oophorectomy as requested below - - Please route response to P CV DIV PREOP (the pre-op pool). Thank you.  Pt has f/u OV on 05/10/21 with Dr. Harl Bowie.

## 2021-04-19 NOTE — Telephone Encounter (Signed)
Echo shows moderate to severe aortic stenosis, more favoring the moderate end of the spectrum. Nothing on echo would prohibit her surgery, recommend proceeding with surgery from cardiac standpiont   Carlyle Dolly MD

## 2021-04-19 NOTE — Telephone Encounter (Addendum)
   Patient Name: Kathryn Gates  DOB: 1929/07/29 MRN: [id number]  Primary Cardiologist: Carlyle Dolly, MD  Chart reviewed as part of pre-operative protocol coverage as requested below (R salpingo-oopherectomy). Per Dr. Harl Bowie, "Echo shows moderate to severe aortic stenosis, more favoring the moderate end of the spectrum. Nothing on echo would prohibit her surgery, recommend proceeding with surgery from cardiac standpoint." I did reached out to patient for update on how she is doing. The patient affirms she has been doing well without any new cardiac symptoms. The patient was advised that if she develops new symptoms prior to surgery to contact our office to arrange for a follow-up visit, and she verbalized understanding.  Will route this bundled recommendation to requesting provider via Epic fax function. Please call with questions.  Charlie Pitter, PA-C 04/19/2021, 12:54 PM

## 2021-04-25 ENCOUNTER — Telehealth: Payer: Self-pay | Admitting: *Deleted

## 2021-04-25 NOTE — Telephone Encounter (Signed)
Spoke with the patient and scheduled a new patient appt with Dr Berline Lopes on 11/7 at 10:30 am. Patient stated "I will keep the appt for now, but I would like to request a female physician and female surgeon." Explained that I would call Dr Evie Lacks office and relay the message.

## 2021-04-28 ENCOUNTER — Telehealth: Payer: Self-pay | Admitting: *Deleted

## 2021-04-28 NOTE — Telephone Encounter (Signed)
Patient and family member called the office and will keep the appt for 11/7

## 2021-04-29 ENCOUNTER — Encounter: Payer: Self-pay | Admitting: Gynecologic Oncology

## 2021-05-02 ENCOUNTER — Inpatient Hospital Stay: Payer: Medicare Other | Attending: Gynecologic Oncology | Admitting: Gynecologic Oncology

## 2021-05-02 ENCOUNTER — Inpatient Hospital Stay (HOSPITAL_BASED_OUTPATIENT_CLINIC_OR_DEPARTMENT_OTHER): Payer: Medicare Other | Admitting: Gynecologic Oncology

## 2021-05-02 ENCOUNTER — Encounter: Payer: Self-pay | Admitting: Gynecologic Oncology

## 2021-05-02 ENCOUNTER — Telehealth: Payer: Self-pay

## 2021-05-02 ENCOUNTER — Other Ambulatory Visit: Payer: Self-pay

## 2021-05-02 VITALS — BP 162/78 | HR 79 | Temp 98.7°F | Resp 16 | Ht 60.0 in | Wt 94.5 lb

## 2021-05-02 DIAGNOSIS — Z7982 Long term (current) use of aspirin: Secondary | ICD-10-CM | POA: Diagnosis not present

## 2021-05-02 DIAGNOSIS — R0609 Other forms of dyspnea: Secondary | ICD-10-CM | POA: Insufficient documentation

## 2021-05-02 DIAGNOSIS — F419 Anxiety disorder, unspecified: Secondary | ICD-10-CM | POA: Insufficient documentation

## 2021-05-02 DIAGNOSIS — I10 Essential (primary) hypertension: Secondary | ICD-10-CM | POA: Diagnosis not present

## 2021-05-02 DIAGNOSIS — Z86018 Personal history of other benign neoplasm: Secondary | ICD-10-CM | POA: Diagnosis not present

## 2021-05-02 DIAGNOSIS — I352 Nonrheumatic aortic (valve) stenosis with insufficiency: Secondary | ICD-10-CM | POA: Diagnosis not present

## 2021-05-02 DIAGNOSIS — N9489 Other specified conditions associated with female genital organs and menstrual cycle: Secondary | ICD-10-CM | POA: Insufficient documentation

## 2021-05-02 DIAGNOSIS — Z79899 Other long term (current) drug therapy: Secondary | ICD-10-CM | POA: Diagnosis not present

## 2021-05-02 DIAGNOSIS — I6529 Occlusion and stenosis of unspecified carotid artery: Secondary | ICD-10-CM | POA: Insufficient documentation

## 2021-05-02 DIAGNOSIS — E871 Hypo-osmolality and hyponatremia: Secondary | ICD-10-CM | POA: Diagnosis not present

## 2021-05-02 DIAGNOSIS — D3911 Neoplasm of uncertain behavior of right ovary: Secondary | ICD-10-CM | POA: Diagnosis not present

## 2021-05-02 DIAGNOSIS — Z8673 Personal history of transient ischemic attack (TIA), and cerebral infarction without residual deficits: Secondary | ICD-10-CM | POA: Insufficient documentation

## 2021-05-02 DIAGNOSIS — E785 Hyperlipidemia, unspecified: Secondary | ICD-10-CM | POA: Insufficient documentation

## 2021-05-02 MED ORDER — SENNOSIDES-DOCUSATE SODIUM 8.6-50 MG PO TABS: 2.0000 | ORAL_TABLET | Freq: Every day | ORAL | 0 refills | Status: AC

## 2021-05-02 MED ORDER — TRAMADOL HCL 50 MG PO TABS: 50.0000 mg | ORAL_TABLET | Freq: Two times a day (BID) | ORAL | 0 refills | Status: DC | PRN

## 2021-05-02 NOTE — H&P (View-Only) (Signed)
GYNECOLOGIC ONCOLOGY NEW PATIENT CONSULTATION   Patient Name: Kathryn Gates  Patient Age: 87 y.o. Date of Service: 05/02/21 Referring Provider: Gari Crown, MD  Primary Care Provider: Curlene Labrum, MD Consulting Provider: Jeral Pinch, MD   Assessment/Plan:  Postmenopausal patient with complex mostly cystic mass.   Reviewed imaging findings with patient, son (by phone) and caretaker. Overall, mass is mostly cystic and my concern for malignancy is low. WE reviewed that her CA-125 is normal but that this is not a diagnostic test. It may be normal in up to 50% of early stage ovarian cancers and can be elevated in many non cancerous disease processes.  I recommend surgery both for diagnostic and therapeutic purposes.   We will plan to get a CEA with her pre-op labs.  I recommend proceeding with robotic surgery. Mass will be placed in a bag for controlled cyst drainage and sent for frozen section during surgery. We discussed possible diagnoses with regard to the adnexa at the time of frozen section including benign mass, borderline tumor, and malignancy. If benign mass, I recommend BSO. In the setting of borderline tumor, we discussed that completion surgery is recommended (including total hysterectomy). If malignancy encountered, we discussed total hysterectomy but also staging, including possible lymphadenectomy, omentectomy.    We discussed plan for a robotic assisted bilateral salpingo-oophorectomy,  possible total hysterectomy, possible staging, possible laparotomy. The risks of surgery were discussed in detail and she understands these to include infection; wound separation; hernia; vaginal cuff separation, injury to adjacent organs such as bowel, bladder, blood vessels, ureters and nerves; bleeding which may require blood transfusion; anesthesia risk; thromboembolic events; possible death; unforeseen complications; possible need for re-exploration; medical complications such as heart  attack, stroke, pleural effusion and pneumonia; and, if full lymphadenectomy is performed the risk of lymphedema and lymphocyst. The patient will receive DVT and antibiotic prophylaxis as indicated. She voiced a clear understanding. She had the opportunity to ask questions. Perioperative instructions were reviewed with her. Prescriptions for post-op medications were sent to her pharmacy of choice.  Given age as well as recent history of hypotension and hyponatremia, will reach out to PCP for clearance. Will plan to observe overnight after surgery. Already have cardiac clearance.   A copy of this note was sent to the patient's referring provider.   85 minutes of total time was spent for this patient encounter, including preparation, face-to-face counseling with the patient and coordination of care, and documentation of the encounter.   Jeral Pinch, MD  Division of Gynecologic Oncology  Department of Obstetrics and Gynecology  [facility]  ___________________________________________  Chief Complaint: Chief Complaint  Patient presents with   Adnexal mass    History of Present Illness:  Kathryn Gates is a 87 y.o. y.o. female who is seen in consultation at the request of Dr. Evie Lacks for an evaluation of a complex adnexal mass.  Patient reports an approximate 79-month history of having intermittent right-sided abdominal pain.  Pain has not required medications and is just enough to "know something is there".  She has difficulty laying on that side and cannot have anything tight around her abdomen.  She does not remember having pain like this before 2 months ago.  She underwent abdominal ultrasound in early September, and given findings of an adnexal mass, was referred to a gynecologist at the end of September.  She now has seen her cardiologist for clearance prior to surgery.  She reports decreased appetite for months,  noting that she is very stressed.  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  Patient has a caretaker who comes in every day during the week.  She tries to eat and gets hungry, denies any early satiety or bloating.  Her weight had been 120 to 125 pounds.  As of this morning on our scale, her weight is 95.  In review in care everywhere, she weighed 102 pounds in February of this year.  Patient reports regular bowel function, denies any issues with constipation or diarrhea.  She denies any urinary symptoms.  Patient has a cardiac history notable for hypertension, aortic stenosis, carotid artery disease, TIA, and dyspnea on exertion.  She most recently was evaluated by cardiology at the end of September.  Her last echo showed moderate to severe aortic stenosis.  Per cardiology, she is optimized to proceed with surgery.  Patient was previously worked up for weight loss secondary to poor appetite in January of this year by gastroenterology.  Recommendation at that visit was to undergo EGD to evaluate for peptic ulcer disease, esophagitis, gastritis, H. pylori, and other possible etiologies.  The patient ultimately canceled secondary to increasing COVID cases and did not reschedule further evaluation.  She has had hyponatremia before, most recently in February of this year requiring treatment in the emergency department.  Her last CMP in mid September showed a sodium of 134.  Patient lives alone with her sister.  They live in [place].  She denies any tobacco or alcohol use.  She has some shortness of breath with ambulation, denies any chest pain or shortness of breath at rest.  PAST MEDICAL HISTORY:  Past Medical History:  Diagnosis Date   Acoustic neuroma ([place])    Aortic regurgitation    a. Seen on 2D echo in 2013 (moderate) but not mentioned in 11/2013 echo.   Aortic stenosis    a. 2D Echo 11/2013: mild AS.   Carotid stenosis    a. Duplex 1-39% BICA in 11/2013.   Generalized anxiety disorder    Headache     a. Possible tension HA versus migraine variant.   Hyperlipidemia    Hypertension    a. Labile BP.   Hyponatremia    TIA (transient ischemic attack)      PAST SURGICAL HISTORY:  Past Surgical History:  Procedure Laterality Date   CATARACT EXTRACTION     CESAREAN SECTION  1973   CHOLECYSTECTOMY     WRIST SURGERY  2007   Right    OB/GYN HISTORY:  OB History  Gravida Para Term Preterm AB Living  1 1          SAB IAB Ectopic Multiple Live Births               # Outcome Date GA Lbr Len/2nd Weight Sex Delivery Anes PTL Lv  1 Para             No LMP recorded. Patient is postmenopausal.  Age at menarche: 46 Age at menopause: 53 Hx of HRT: Denies Hx of STDs: Denies Last pap: Years ago History of abnormal pap smears: Denies  SCREENING STUDIES:  Last mammogram: Years ago  Last colonoscopy: Has never had.  Patient reports using Cologuard in the past.   MEDICATIONS: Outpatient Encounter Medications as of 05/02/2021  Medication Sig   aspirin EC 81 MG tablet Take 81 mg by mouth daily.   Multiple Vitamin (MULTIVITAMIN) capsule Take 1 capsule by mouth  daily.   olmesartan (BENICAR) 20 MG tablet Take 20 mg by mouth daily.   RABEprazole (ACIPHEX) 20 MG tablet Take 20 mg by mouth daily.   senna-docusate (SENOKOT-S) 8.6-50 MG tablet Take 2 tablets by mouth at bedtime. For AFTER surgery, do not take if having diarrhea   traMADol (ULTRAM) 50 MG tablet Take 1 tablet (50 mg total) by mouth every 12 (twelve) hours as needed for severe pain. For AFTER surgery, do not take and drive   ezetimibe (ZETIA) 10 MG tablet Take 1 tablet (10 mg total) by mouth daily. (Patient not taking: Reported on 04/29/2021)   FLUoxetine (PROZAC) 10 MG capsule Take 10 mg by mouth daily. Takes 2.5 mg daily. (Patient not taking: No sig reported)   nitroGLYCERIN (NITROSTAT) 0.4 MG SL tablet Place 1 tablet (0.4 mg total) under the tongue every 5 (five) minutes as needed for chest pain. (Patient not taking: No sig  reported)   olmesartan (BENICAR) 5 MG tablet Take 5 mg by mouth daily as needed. (Patient not taking: No sig reported)   No facility-administered encounter medications on file as of 05/02/2021.    ALLERGIES:  No Known Allergies   FAMILY HISTORY:  Family History  Problem Relation Age of Onset   Stroke Other    Heart failure Other        CHF     SOCIAL HISTORY:  Social Connections: Not on file    REVIEW OF SYSTEMS:  Pertinent positives include decreased appetite, fatigue. Denies fevers, chills, unexplained weight changes. Denies hearing loss, neck lumps or masses, mouth sores, ringing in ears or voice changes. Denies cough or wheezing.  Denies shortness of breath. Denies chest pain or palpitations. Denies leg swelling. Denies abdominal distention, blood in stools, constipation, diarrhea, nausea, vomiting, or early satiety. Denies pain with intercourse, dysuria, frequency, hematuria or incontinence. Denies hot flashes, pelvic pain, vaginal bleeding or vaginal discharge.   Denies joint pain, back pain or muscle pain/cramps. Denies itching, rash, or wounds. Denies dizziness, headaches, numbness or seizures. Denies swollen lymph nodes or glands, denies easy bruising or bleeding. Denies anxiety, depression, confusion, or decreased concentration.  Physical Exam:  Vital Signs for this encounter:  Blood pressure (!) 192/91, pulse 79, temperature 98.7 F (37.1 C), temperature source Oral, resp. rate 16, height 5' (1.524 m), weight 94 lb 8 oz (42.9 kg), SpO2 100 %. Body mass index is 18.46 kg/m. General: Alert, oriented, no acute distress.  HEENT: Normocephalic, atraumatic. Sclera anicteric.  Chest: Clear to auscultation bilaterally. No wheezes, rhonchi, or rales. Cardiovascular: Systolic murmur, regular rate and rhythm.  Abdomen: Normoactive bowel sounds.  Patient has a palpable mass in the lower abdomen that is mobile, nontender, approximately 12 cm.  No fluid wave  appreciated. Extremities: Grossly normal range of motion. Warm, well perfused. No edema bilaterally.  Skin: No rashes or lesions.  Lymphatics: No cervical, supraclavicular, or inguinal adenopathy.  GU: Normal, atrophic external female genitalia.  Patient declined speculum exam, on 1 digit exam, patient tolerated bimanual exam very poorly.  No palpable masses within the vagina.  Not able to advance 1 digit far enough to palpate cervix, uterus or pelvic mass.  LABORATORY AND RADIOLOGIC DATA:  Outside medical records were reviewed to synthesize the above history, along with the history and physical obtained during the visit.   Lab Results  Component Value Date   WBC 6.4 10/08/2014   HGB 10.7 (L) 10/08/2014   HCT 32.3 (L) 10/08/2014   PLT 269 10/08/2014   GLUCOSE  99 10/08/2014   CHOL 237 (H) 12/13/2012   TRIG 62 12/13/2012   HDL 73 12/13/2012   LDLCALC 152 (H) 12/13/2012   ALT <8 10/08/2014   AST 14 10/08/2014   NA 134 (L) 10/08/2014   K 4.7 10/08/2014   CL 100 10/08/2014   CREATININE 1.18 (H) 10/08/2014   BUN 17 10/08/2014   CO2 24 10/08/2014   TSH 2.524 10/08/2014   HGBA1C 5.4 12/12/2012   CA-125: 8.7  Abdominal ultrasound on 9/3: Multi septated right adnexal mass measuring up to 11.5 cm.  Small benign-appearing cysts in the liver, pancreas, and left kidney.

## 2021-05-02 NOTE — Progress Notes (Signed)
GYNECOLOGIC ONCOLOGY NEW PATIENT CONSULTATION   Patient Name: Kathryn Gates  Patient Age: 87 y.o. Date of Service: 05/02/21 Referring Provider: Gari Crown, MD  Primary Care Provider: Curlene Labrum, MD Consulting Provider: Jeral Pinch, MD   Assessment/Plan:  Postmenopausal patient with complex mostly cystic mass.   Reviewed imaging findings with patient, son (by phone) and caretaker. Overall, mass is mostly cystic and my concern for malignancy is low. WE reviewed that her CA-125 is normal but that this is not a diagnostic test. It may be normal in up to 50% of early stage ovarian cancers and can be elevated in many non cancerous disease processes.  I recommend surgery both for diagnostic and therapeutic purposes.   We will plan to get a CEA with her pre-op labs.  I recommend proceeding with robotic surgery. Mass will be placed in a bag for controlled cyst drainage and sent for frozen section during surgery. We discussed possible diagnoses with regard to the adnexa at the time of frozen section including benign mass, borderline tumor, and malignancy. If benign mass, I recommend BSO. In the setting of borderline tumor, we discussed that completion surgery is recommended (including total hysterectomy). If malignancy encountered, we discussed total hysterectomy but also staging, including possible lymphadenectomy, omentectomy.    We discussed plan for a robotic assisted bilateral salpingo-oophorectomy,  possible total hysterectomy, possible staging, possible laparotomy. The risks of surgery were discussed in detail and she understands these to include infection; wound separation; hernia; vaginal cuff separation, injury to adjacent organs such as bowel, bladder, blood vessels, ureters and nerves; bleeding which may require blood transfusion; anesthesia risk; thromboembolic events; possible death; unforeseen complications; possible need for re-exploration; medical complications such as heart  attack, stroke, pleural effusion and pneumonia; and, if full lymphadenectomy is performed the risk of lymphedema and lymphocyst. The patient will receive DVT and antibiotic prophylaxis as indicated. She voiced a clear understanding. She had the opportunity to ask questions. Perioperative instructions were reviewed with her. Prescriptions for post-op medications were sent to her pharmacy of choice.  Given age as well as recent history of hypotension and hyponatremia, will reach out to PCP for clearance. Will plan to observe overnight after surgery. Already have cardiac clearance.   A copy of this note was sent to the patient's referring provider.   85 minutes of total time was spent for this patient encounter, including preparation, face-to-face counseling with the patient and coordination of care, and documentation of the encounter.   Kathryn Pinch, MD  Division of Gynecologic Oncology  Department of Obstetrics and Gynecology  [facility]  ___________________________________________  Chief Complaint: Chief Complaint  Patient presents with   Adnexal mass    History of Present Illness:  Kathryn Gates is a 87 y.o. y.o. female who is seen in consultation at the request of Kathryn Gates for an evaluation of a complex adnexal mass.  Patient reports an approximate 15-month history of having intermittent right-sided abdominal pain.  Pain has not required medications and is just enough to "know something is there".  She has difficulty laying on that side and cannot have anything tight around her abdomen.  She does not remember having pain like this before 2 months ago.  She underwent abdominal ultrasound in early September, and given findings of an adnexal mass, was referred to a gynecologist at the end of September.  She now has seen her cardiologist for clearance prior to surgery.  She reports decreased appetite for months,  noting that she is very stressed.  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  Patient has a caretaker who comes in every day during the week.  She tries to eat and gets hungry, denies any early satiety or bloating.  Her weight had been 120 to 125 pounds.  As of this morning on our scale, her weight is 95.  In review in care everywhere, she weighed 102 pounds in February of this year.  Patient reports regular bowel function, denies any issues with constipation or diarrhea.  She denies any urinary symptoms.  Patient has a cardiac history notable for hypertension, aortic stenosis, carotid artery disease, TIA, and dyspnea on exertion.  She most recently was evaluated by cardiology at the end of September.  Her last echo showed moderate to severe aortic stenosis.  Per cardiology, she is optimized to proceed with surgery.  Patient was previously worked up for weight loss secondary to poor appetite in January of this year by gastroenterology.  Recommendation at that visit was to undergo EGD to evaluate for peptic ulcer disease, esophagitis, gastritis, H. pylori, and other possible etiologies.  The patient ultimately canceled secondary to increasing COVID cases and did not reschedule further evaluation.  She has had hyponatremia before, most recently in February of this year requiring treatment in the emergency department.  Her last CMP in mid September showed a sodium of 134.  Patient lives alone with her sister.  They live in [place].  She denies any tobacco or alcohol use.  She has some shortness of breath with ambulation, denies any chest pain or shortness of breath at rest.  PAST MEDICAL HISTORY:  Past Medical History:  Diagnosis Date   Acoustic neuroma ([place])    Aortic regurgitation    a. Seen on 2D echo in 2013 (moderate) but not mentioned in 11/2013 echo.   Aortic stenosis    a. 2D Echo 11/2013: mild AS.   Carotid stenosis    a. Duplex 1-39% BICA in 11/2013.   Generalized anxiety disorder    Headache     a. Possible tension HA versus migraine variant.   Hyperlipidemia    Hypertension    a. Labile BP.   Hyponatremia    TIA (transient ischemic attack)      PAST SURGICAL HISTORY:  Past Surgical History:  Procedure Laterality Date   CATARACT EXTRACTION     CESAREAN SECTION  1973   CHOLECYSTECTOMY     WRIST SURGERY  2007   Right    OB/GYN HISTORY:  OB History  Gravida Para Term Preterm AB Living  1 1          SAB IAB Ectopic Multiple Live Births               # Outcome Date GA Lbr Len/2nd Weight Sex Delivery Anes PTL Lv  1 Para             No LMP recorded. Patient is postmenopausal.  Age at menarche: 40 Age at menopause: 37 Hx of HRT: Denies Hx of STDs: Denies Last pap: Years ago History of abnormal pap smears: Denies  SCREENING STUDIES:  Last mammogram: Years ago  Last colonoscopy: Has never had.  Patient reports using Cologuard in the past.   MEDICATIONS: Outpatient Encounter Medications as of 05/02/2021  Medication Sig   aspirin EC 81 MG tablet Take 81 mg by mouth daily.   Multiple Vitamin (MULTIVITAMIN) capsule Take 1 capsule by mouth  daily.   olmesartan (BENICAR) 20 MG tablet Take 20 mg by mouth daily.   RABEprazole (ACIPHEX) 20 MG tablet Take 20 mg by mouth daily.   senna-docusate (SENOKOT-S) 8.6-50 MG tablet Take 2 tablets by mouth at bedtime. For AFTER surgery, do not take if having diarrhea   traMADol (ULTRAM) 50 MG tablet Take 1 tablet (50 mg total) by mouth every 12 (twelve) hours as needed for severe pain. For AFTER surgery, do not take and drive   ezetimibe (ZETIA) 10 MG tablet Take 1 tablet (10 mg total) by mouth daily. (Patient not taking: Reported on 04/29/2021)   FLUoxetine (PROZAC) 10 MG capsule Take 10 mg by mouth daily. Takes 2.5 mg daily. (Patient not taking: No sig reported)   nitroGLYCERIN (NITROSTAT) 0.4 MG SL tablet Place 1 tablet (0.4 mg total) under the tongue every 5 (five) minutes as needed for chest pain. (Patient not taking: No sig  reported)   olmesartan (BENICAR) 5 MG tablet Take 5 mg by mouth daily as needed. (Patient not taking: No sig reported)   No facility-administered encounter medications on file as of 05/02/2021.    ALLERGIES:  No Known Allergies   FAMILY HISTORY:  Family History  Problem Relation Age of Onset   Stroke Other    Heart failure Other        CHF     SOCIAL HISTORY:  Social Connections: Not on file    REVIEW OF SYSTEMS:  Pertinent positives include decreased appetite, fatigue. Denies fevers, chills, unexplained weight changes. Denies hearing loss, neck lumps or masses, mouth sores, ringing in ears or voice changes. Denies cough or wheezing.  Denies shortness of breath. Denies chest pain or palpitations. Denies leg swelling. Denies abdominal distention, blood in stools, constipation, diarrhea, nausea, vomiting, or early satiety. Denies pain with intercourse, dysuria, frequency, hematuria or incontinence. Denies hot flashes, pelvic pain, vaginal bleeding or vaginal discharge.   Denies joint pain, back pain or muscle pain/cramps. Denies itching, rash, or wounds. Denies dizziness, headaches, numbness or seizures. Denies swollen lymph nodes or glands, denies easy bruising or bleeding. Denies anxiety, depression, confusion, or decreased concentration.  Physical Exam:  Vital Signs for this encounter:  Blood pressure (!) 192/91, pulse 79, temperature 98.7 F (37.1 C), temperature source Oral, resp. rate 16, height 5' (1.524 m), weight 94 lb 8 oz (42.9 kg), SpO2 100 %. Body mass index is 18.46 kg/m. General: Alert, oriented, no acute distress.  HEENT: Normocephalic, atraumatic. Sclera anicteric.  Chest: Clear to auscultation bilaterally. No wheezes, rhonchi, or rales. Cardiovascular: Systolic murmur, regular rate and rhythm.  Abdomen: Normoactive bowel sounds.  Patient has a palpable mass in the lower abdomen that is mobile, nontender, approximately 12 cm.  No fluid wave  appreciated. Extremities: Grossly normal range of motion. Warm, well perfused. No edema bilaterally.  Skin: No rashes or lesions.  Lymphatics: No cervical, supraclavicular, or inguinal adenopathy.  GU: Normal, atrophic external female genitalia.  Patient declined speculum exam, on 1 digit exam, patient tolerated bimanual exam very poorly.  No palpable masses within the vagina.  Not able to advance 1 digit far enough to palpate cervix, uterus or pelvic mass.  LABORATORY AND RADIOLOGIC DATA:  Outside medical records were reviewed to synthesize the above history, along with the history and physical obtained during the visit.   Lab Results  Component Value Date   WBC 6.4 10/08/2014   HGB 10.7 (L) 10/08/2014   HCT 32.3 (L) 10/08/2014   PLT 269 10/08/2014   GLUCOSE  99 10/08/2014   CHOL 237 (H) 12/13/2012   TRIG 62 12/13/2012   HDL 73 12/13/2012   LDLCALC 152 (H) 12/13/2012   ALT <8 10/08/2014   AST 14 10/08/2014   NA 134 (L) 10/08/2014   K 4.7 10/08/2014   CL 100 10/08/2014   CREATININE 1.18 (H) 10/08/2014   BUN 17 10/08/2014   CO2 24 10/08/2014   TSH 2.524 10/08/2014   HGBA1C 5.4 12/12/2012   CA-125: 8.7  Abdominal ultrasound on 9/3: Multi septated right adnexal mass measuring up to 11.5 cm.  Small benign-appearing cysts in the liver, pancreas, and left kidney.

## 2021-05-02 NOTE — Telephone Encounter (Signed)
ENCOUNTER OPENED IN ERROR

## 2021-05-02 NOTE — Patient Instructions (Addendum)
Preparing for your Surgery  Plan for surgery on May 26, 2021 with Dr. Jeral Pinch at [place] will be scheduled for robotic assisted laparoscopic bilateral salpingo-oophorectomy (removal of both ovaries and fallopian tubes), possible robotic assisted total laparoscopic hysterectomy (removal of the uterus and cervix), possible laparotomy (larger incision on the abdomen if needed), possible staging if a cancer is identified.  Pre-operative Testing -You will receive a phone call from presurgical testing at [facility] to arrange for a pre-operative appointment and lab work.  -Bring your insurance card, copy of an advanced directive if applicable, medication list  -At that visit, you will be asked to sign a consent for a possible blood transfusion in case a transfusion becomes necessary during surgery.  The need for a blood transfusion is rare but having consent is a necessary part of your care.     -You are fine to keep taking your baby Asprin (81 mg) before surgery with your last dose being the day before surgery.  -Do not take supplements such as fish oil (omega 3), red yeast rice, turmeric before your surgery. You want to avoid medications with aspirin in them including headache powders such as BC or Goody's), Excedrin migraine.  Day Before Surgery at [place] will be asked to take in a light diet the day before surgery. You will be advised you can have clear liquids up until 3 hours before your surgery.    Eat a light diet the day before surgery.  Examples including soups, broths, toast, yogurt, mashed potatoes.  AVOID GAS PRODUCING FOODS. Things to avoid include carbonated beverages (fizzy beverages, sodas), raw fruits and raw vegetables (uncooked), or beans.   If your bowels are filled with gas, your surgeon will have difficulty visualizing your pelvic organs which increases your surgical risks.  Your role in recovery Your role is to become active as soon  as directed by your doctor, while still giving yourself time to heal.  Rest when you feel tired. You will be asked to do the following in order to speed your recovery:  - Cough and breathe deeply. This helps to clear and expand your lungs and can prevent pneumonia after surgery.  - [place]. Do mild physical activity. Walking or moving your legs help your circulation and body functions return to normal. Do not try to get up or walk alone the first time after surgery.   -If you develop swelling on one leg or the other, pain in the back of your leg, redness/warmth in one of your legs, please call the office or go to the Emergency Room to have a doppler to rule out a blood clot. For shortness of breath, chest pain-seek care in the Emergency Room as soon as possible. - Actively manage your pain. Managing your pain lets you move in comfort. We will ask you to rate your pain on a scale of zero to 10. It is your responsibility to tell your doctor or nurse where and how much you hurt so your pain can be treated.  Special Considerations -If you are diabetic, you may be placed on insulin after surgery to have closer control over your blood sugars to promote healing and recovery.  This does not mean that you will be discharged on insulin.  If applicable, your oral antidiabetics will be resumed when you are tolerating a solid diet.  -Your final pathology results from surgery should be available around one week after surgery and  the results will be relayed to you when available.  -Dr. Lahoma Crocker is the surgeon that assists your GYN Oncologist with surgery.  If you end up staying the night, the next day after your surgery you will either see Dr. Denman George, Dr. Berline Lopes, or Dr. Lahoma Crocker.  -FMLA forms can be faxed to [phone] and please allow 5-7 business days for completion.  Pain Management After Surgery -You have been prescribed your pain medication and bowel regimen  medications before surgery so that you can have these available when you are discharged from the hospital. The pain medication is for use ONLY AFTER surgery and a new prescription will not be given.   -Make sure that you have Tylenol and Ibuprofen (if you are able to use this) at home to use on a regular basis after surgery for pain control. We recommend alternating the medications every hour to six hours since they work differently and are processed in the body differently for pain relief.  -Review the attached handout on narcotic use and their risks and side effects.   Bowel Regimen -You have been prescribed Sennakot-S to take nightly to prevent constipation especially if you are taking the narcotic pain medication intermittently.  It is important to prevent constipation and drink adequate amounts of liquids. You can stop taking this medication when you are not taking pain medication and you are back on your normal bowel routine.  Risks of Surgery Risks of surgery are low but include bleeding, infection, damage to surrounding structures, re-operation, blood clots, and very rarely death.   Blood Transfusion Information (For the consent to be signed before surgery)  We will be checking your blood type before surgery so in case of emergencies, we will know what type of blood you would need.                                            WHAT IS A BLOOD TRANSFUSION?  A transfusion is the replacement of blood or some of its parts. Blood is made up of multiple cells which provide different functions. Red blood cells carry oxygen and are used for blood loss replacement. White blood cells fight against infection. Platelets control bleeding. Plasma helps clot blood. Other blood products are available for specialized needs, such as hemophilia or other clotting disorders. BEFORE THE TRANSFUSION  Who gives blood for transfusions?  You may be able to donate blood to be used at a later date on yourself  (autologous donation). Relatives can be asked to donate blood. This is generally not any safer than if you have received blood from a stranger. The same precautions are taken to ensure safety when a relative's blood is donated. Healthy volunteers who are fully evaluated to make sure their blood is safe. This is blood bank blood. Transfusion therapy is the safest it has ever been in the practice of medicine. Before blood is taken from a donor, a complete history is taken to make sure that person has no history of diseases nor engages in risky social behavior (examples are intravenous drug use or sexual activity with multiple partners). The donor's travel history is screened to minimize risk of transmitting infections, such as malaria. The donated blood is tested for signs of infectious diseases, such as HIV and hepatitis. The blood is then tested to be sure it is compatible with you in order to minimize  the chance of a transfusion reaction. If you or a relative donates blood, this is often done in anticipation of surgery and is not appropriate for emergency situations. It takes many days to process the donated blood. RISKS AND COMPLICATIONS Although transfusion therapy is very safe and saves many lives, the main dangers of transfusion include:  Getting an infectious disease. Developing a transfusion reaction. This is an allergic reaction to something in the blood you were given. Every precaution is taken to prevent this. The decision to have a blood transfusion has been considered carefully by your caregiver before blood is given. Blood is not given unless the benefits outweigh the risks.  AFTER SURGERY INSTRUCTIONS  Return to work: 4-6 weeks if applicable  Activity: 1. Be up and out of the bed during the day.  Take a nap if needed.  You may walk up steps but be careful and use the hand rail.  Stair climbing will tire you more than you think, you may need to stop part way and rest.   2. No lifting or  straining for 6 weeks over 10 pounds. No pushing, pulling, straining for 6 weeks.  3. No driving for 1 week(s) if you were cleared to drive before surgery.  Do not drive if you are taking narcotic pain medicine and make sure that your reaction time has returned.   4. You can shower as soon as the next day after surgery. Shower daily.  Use your regular soap and water (not directly on the incision) and pat your incision(s) dry afterwards; don't rub.  No tub baths or submerging your body in water until cleared by your surgeon. If you have the soap that was given to you by pre-surgical testing that was used before surgery, you do not need to use it afterwards because this can irritate your incisions.   5. No sexual activity and nothing in the vagina for 4 weeks. IF YOU HAVE A HYSTERECTOMY, NOTHING IN THE VAGINA FOR 8 WEEKS.  6. You may experience a small amount of clear drainage from your incisions, which is normal.  If the drainage persists, increases, or changes color please call the office.  7. Do not use creams, lotions, or ointments such as neosporin on your incisions after surgery until advised by your surgeon because they can cause removal of the dermabond glue on your incisions.    8. You may experience vaginal spotting after surgery or around the 6-8 week mark from surgery when the stitches at the top of the vagina begin to dissolve IF YOU HAVE A HYSTERECTOMY.  The spotting is normal but if you experience heavy bleeding, call our office.  9. Take Tylenol or ibuprofen first for pain and only use narcotic pain medication for severe pain not relieved by the Tylenol or Ibuprofen.  Monitor your Tylenol intake to a max of 4,000 mg in a 24 hour period. You can alternate these medications after surgery.  Diet: 1. Low sodium Heart Healthy Diet is recommended but you are cleared to resume your normal (before surgery) diet after your procedure.  2. It is safe to use a laxative, such as Miralax or  Colace, if you have difficulty moving your bowels. You have been prescribed Sennakot at bedtime every evening to keep bowel movements regular and to prevent constipation.    Wound Care: 1. Keep clean and dry.  Shower daily.  Reasons to call the Doctor: Fever - Oral temperature greater than 100.4 degrees Fahrenheit Foul-smelling vaginal discharge Difficulty  urinating Nausea and vomiting Increased pain at the site of the incision that is unrelieved with pain medicine. Difficulty breathing with or without chest pain New calf pain especially if only on one side Sudden, continuing increased vaginal bleeding with or without clots.   Contacts: For questions or concerns you should contact:  Dr. Jeral Pinch at [phone]  Joylene John, NP at [phone]  After Hours: call [phone] and have the GYN Oncologist paged/contacted (after 5 pm or on the weekends).  Messages sent via mychart are for non-urgent matters and are not responded to after hours so for urgent needs, please call the after hours number.

## 2021-05-03 NOTE — Patient Instructions (Signed)
Preparing for your Surgery   Plan for surgery on May 26, 2021 with Dr. Jeral Pinch at [place] will be scheduled for robotic assisted laparoscopic bilateral salpingo-oophorectomy (removal of both ovaries and fallopian tubes), possible robotic assisted total laparoscopic hysterectomy (removal of the uterus and cervix), possible laparotomy (larger incision on the abdomen if needed), possible staging if a cancer is identified.   Pre-operative Testing -You will receive a phone call from presurgical testing at [facility] to arrange for a pre-operative appointment and lab work.   -Bring your insurance card, copy of an advanced directive if applicable, medication list   -At that visit, you will be asked to sign a consent for a possible blood transfusion in case a transfusion becomes necessary during surgery.  The need for a blood transfusion is rare but having consent is a necessary part of your care.      -You are fine to keep taking your baby Asprin (81 mg) before surgery with your last dose being the day before surgery.   -Do not take supplements such as fish oil (omega 3), red yeast rice, turmeric before your surgery. You want to avoid medications with aspirin in them including headache powders such as BC or Goody's), Excedrin migraine.   Day Before Surgery at [place] will be asked to take in a light diet the day before surgery. You will be advised you can have clear liquids up until 3 hours before your surgery.     Eat a light diet the day before surgery.  Examples including soups, broths, toast, yogurt, mashed potatoes.  AVOID GAS PRODUCING FOODS. Things to avoid include carbonated beverages (fizzy beverages, sodas), raw fruits and raw vegetables (uncooked), or beans.    If your bowels are filled with gas, your surgeon will have difficulty visualizing your pelvic organs which increases your surgical risks.   Your role in recovery Your role is to become  active as soon as directed by your doctor, while still giving yourself time to heal.  Rest when you feel tired. You will be asked to do the following in order to speed your recovery:   - Cough and breathe deeply. This helps to clear and expand your lungs and can prevent pneumonia after surgery.  - [place]. Do mild physical activity. Walking or moving your legs help your circulation and body functions return to normal. Do not try to get up or walk alone the first time after surgery.   -If you develop swelling on one leg or the other, pain in the back of your leg, redness/warmth in one of your legs, please call the office or go to the Emergency Room to have a doppler to rule out a blood clot. For shortness of breath, chest pain-seek care in the Emergency Room as soon as possible. - Actively manage your pain. Managing your pain lets you move in comfort. We will ask you to rate your pain on a scale of zero to 10. It is your responsibility to tell your doctor or nurse where and how much you hurt so your pain can be treated.   Special Considerations -If you are diabetic, you may be placed on insulin after surgery to have closer control over your blood sugars to promote healing and recovery.  This does not mean that you will be discharged on insulin.  If applicable, your oral antidiabetics will be resumed when you are tolerating a solid diet.   -Your final  pathology results from surgery should be available around one week after surgery and the results will be relayed to you when available.   -Dr. Lahoma Crocker is the surgeon that assists your GYN Oncologist with surgery.  If you end up staying the night, the next day after your surgery you will either see Dr. Denman George, Dr. Berline Lopes, or Dr. Lahoma Crocker.   -FMLA forms can be faxed to [phone] and please allow 5-7 business days for completion.   Pain Management After Surgery -You have been prescribed your pain medication and  bowel regimen medications before surgery so that you can have these available when you are discharged from the hospital. The pain medication is for use ONLY AFTER surgery and a new prescription will not be given.    -Make sure that you have Tylenol and Ibuprofen (if you are able to use this) at home to use on a regular basis after surgery for pain control. We recommend alternating the medications every hour to six hours since they work differently and are processed in the body differently for pain relief.   -Review the attached handout on narcotic use and their risks and side effects.    Bowel Regimen -You have been prescribed Sennakot-S to take nightly to prevent constipation especially if you are taking the narcotic pain medication intermittently.  It is important to prevent constipation and drink adequate amounts of liquids. You can stop taking this medication when you are not taking pain medication and you are back on your normal bowel routine.   Risks of Surgery Risks of surgery are low but include bleeding, infection, damage to surrounding structures, re-operation, blood clots, and very rarely death.     Blood Transfusion Information (For the consent to be signed before surgery)   We will be checking your blood type before surgery so in case of emergencies, we will know what type of blood you would need.                                             WHAT IS A BLOOD TRANSFUSION?   A transfusion is the replacement of blood or some of its parts. Blood is made up of multiple cells which provide different functions. Red blood cells carry oxygen and are used for blood loss replacement. White blood cells fight against infection. Platelets control bleeding. Plasma helps clot blood. Other blood products are available for specialized needs, such as hemophilia or other clotting disorders. BEFORE THE TRANSFUSION  Who gives blood for transfusions?  You may be able to donate blood to be used at a  later date on yourself (autologous donation). Relatives can be asked to donate blood. This is generally not any safer than if you have received blood from a stranger. The same precautions are taken to ensure safety when a relative's blood is donated. Healthy volunteers who are fully evaluated to make sure their blood is safe. This is blood bank blood. Transfusion therapy is the safest it has ever been in the practice of medicine. Before blood is taken from a donor, a complete history is taken to make sure that person has no history of diseases nor engages in risky social behavior (examples are intravenous drug use or sexual activity with multiple partners). The donor's travel history is screened to minimize risk of transmitting infections, such as malaria. The donated blood is tested for signs  of infectious diseases, such as HIV and hepatitis. The blood is then tested to be sure it is compatible with you in order to minimize the chance of a transfusion reaction. If you or a relative donates blood, this is often done in anticipation of surgery and is not appropriate for emergency situations. It takes many days to process the donated blood. RISKS AND COMPLICATIONS Although transfusion therapy is very safe and saves many lives, the main dangers of transfusion include:  Getting an infectious disease. Developing a transfusion reaction. This is an allergic reaction to something in the blood you were given. Every precaution is taken to prevent this. The decision to have a blood transfusion has been considered carefully by your caregiver before blood is given. Blood is not given unless the benefits outweigh the risks.   AFTER SURGERY INSTRUCTIONS   Return to work: 4-6 weeks if applicable   Activity: 1. Be up and out of the bed during the day.  Take a nap if needed.  You may walk up steps but be careful and use the hand rail.  Stair climbing will tire you more than you think, you may need to stop part way and  rest.    2. No lifting or straining for 6 weeks over 10 pounds. No pushing, pulling, straining for 6 weeks.   3. No driving for 1 week(s) if you were cleared to drive before surgery.  Do not drive if you are taking narcotic pain medicine and make sure that your reaction time has returned.    4. You can shower as soon as the next day after surgery. Shower daily.  Use your regular soap and water (not directly on the incision) and pat your incision(s) dry afterwards; don't rub.  No tub baths or submerging your body in water until cleared by your surgeon. If you have the soap that was given to you by pre-surgical testing that was used before surgery, you do not need to use it afterwards because this can irritate your incisions.    5. No sexual activity and nothing in the vagina for 4 weeks. IF YOU HAVE A HYSTERECTOMY, NOTHING IN THE VAGINA FOR 8 WEEKS.   6. You may experience a small amount of clear drainage from your incisions, which is normal.  If the drainage persists, increases, or changes color please call the office.   7. Do not use creams, lotions, or ointments such as neosporin on your incisions after surgery until advised by your surgeon because they can cause removal of the dermabond glue on your incisions.     8. You may experience vaginal spotting after surgery or around the 6-8 week mark from surgery when the stitches at the top of the vagina begin to dissolve IF YOU HAVE A HYSTERECTOMY.  The spotting is normal but if you experience heavy bleeding, call our office.   9. Take Tylenol or ibuprofen first for pain and only use narcotic pain medication for severe pain not relieved by the Tylenol or Ibuprofen.  Monitor your Tylenol intake to a max of 4,000 mg in a 24 hour period. You can alternate these medications after surgery.   Diet: 1. Low sodium Heart Healthy Diet is recommended but you are cleared to resume your normal (before surgery) diet after your procedure.   2. It is safe to use  a laxative, such as Miralax or Colace, if you have difficulty moving your bowels. You have been prescribed Sennakot at bedtime every evening to keep bowel  movements regular and to prevent constipation.     Wound Care: 1. Keep clean and dry.  Shower daily.   Reasons to call the Doctor: Fever - Oral temperature greater than 100.4 degrees Fahrenheit Foul-smelling vaginal discharge Difficulty urinating Nausea and vomiting Increased pain at the site of the incision that is unrelieved with pain medicine. Difficulty breathing with or without chest pain New calf pain especially if only on one side Sudden, continuing increased vaginal bleeding with or without clots.   Contacts: For questions or concerns you should contact:   Dr. Jeral Pinch at [phone]   Joylene John, NP at [phone]   After Hours: call [phone] and have the GYN Oncologist paged/contacted (after 5 pm or on the weekends).   Messages sent via mychart are for non-urgent matters and are not responded to after hours so for urgent needs, please call the after hours number.

## 2021-05-03 NOTE — Progress Notes (Signed)
Patient here with her caregiver for new patient consultation with Dr. Jeral Pinch and for a pre-operative discussion prior to her scheduled surgery on May 26, 2021. She is scheduled for robotic assisted laparoscopic bilateral salpingo-oophorectomy, possible robotic assisted total laparoscopic hysterectomy, possible laparotomy, possible staging if a cancer is identified. The surgery was discussed in detail.  See after visit summary for additional details. Visual aids used to discuss items related to surgery including the incentive spirometer, sequential compression stockings, foley catheter, IV pump, multi-modal pain regimen including tylenol, photo of the surgical robot, female reproductive system to discuss surgery in detail.      Discussed post-op pain management in detail including the aspects of the enhanced recovery pathway.  Advised her that a new prescription would be sent in for tramadol and it is only to be used for after her upcoming surgery.  We discussed the use of tylenol post-op and to monitor for a maximum of 4,000 mg in a 24 hour period.  Also prescribed sennakot to be used after surgery and to hold if having loose stools.  Discussed bowel regimen in detail. She does not use ibuprofen. She also denies use of prozac.   Discussed the use of heparin pre-op, SCDs, and measures to take at home to prevent DVT including frequent mobility.  Reportable signs and symptoms of DVT discussed. Post-operative instructions discussed and expectations for after surgery. Incisional care discussed as well including reportable signs and symptoms including erythema, drainage, wound separation.     10 minutes spent with the patient.  Verbalizing understanding of material discussed. No needs or concerns voiced at the end of the visit.   Advised patient and caregiver to call for any needs.  Advised that her post-operative medications had been prescribed and could be picked up at any time.     This  appointment is included in the global surgical bundle as pre-operative teaching and has no charge.

## 2021-05-05 ENCOUNTER — Other Ambulatory Visit: Payer: Medicare Other

## 2021-05-10 ENCOUNTER — Encounter: Payer: Self-pay | Admitting: Cardiology

## 2021-05-10 ENCOUNTER — Ambulatory Visit (INDEPENDENT_AMBULATORY_CARE_PROVIDER_SITE_OTHER): Payer: Medicare Other | Admitting: Cardiology

## 2021-05-10 VITALS — BP 150/70 | HR 66 | Ht 60.0 in | Wt 95.4 lb

## 2021-05-10 DIAGNOSIS — I350 Nonrheumatic aortic (valve) stenosis: Secondary | ICD-10-CM | POA: Diagnosis not present

## 2021-05-10 DIAGNOSIS — Z01810 Encounter for preprocedural cardiovascular examination: Secondary | ICD-10-CM

## 2021-05-10 DIAGNOSIS — I6523 Occlusion and stenosis of bilateral carotid arteries: Secondary | ICD-10-CM

## 2021-05-10 NOTE — Progress Notes (Signed)
Clinical Summary Kathryn Gates is a 87 y.o.female seen today for follow up of the following medical problems.   1. Moderate to severe aortic stenosis 11/2019 echo LVEF 60-65%, grade I dd, mild MR, mild AI, mod AS AVA VTI 1.09 mean grad 23 DI 0.35   03/2021 echo: LVEF 60-65%, no WMAs, grade I dd, mild to mod AI, mod to severe AS mean grad 31, AVA VTI 0.90, SVI 52, DI 0.32  - no recent SOB, no recent chest pains. - highest levels of activity is housework, sweeping, vacuuming, mopping. No exertional symptoms.      2. Labile HTN - from Dr Court Joy notes thought to be neurolofigically mediated labile HTN - compliant with meds. BP's variable at home, SBPs 130s-150s  - recent ER vsiits with low bp's, requiring IVFs - has not been taking her norvasc or omesartan last visit with PA Leonides Sake - she is on benicar 20mg  daily, has additional 5mg  prn. Home sbps 130s   3. Anxiety     4. Carotid stenosis - 10/2020 mild by prior carotid US   5. History of TIA   6. Hyperlipidema - she is on zetia - unclear statin history, she believes caused muscle aches  7. Preop evaluation - scheduled for robotic bilateral salpingo oopherectomy Past Medical History:  Diagnosis Date   Acoustic neuroma ([place])    Aortic regurgitation    a. Seen on 2D echo in 2013 (moderate) but not mentioned in 11/2013 echo.   Aortic stenosis    a. 2D Echo 11/2013: mild AS.   Carotid stenosis    a. Duplex 1-39% BICA in 11/2013.   Generalized anxiety disorder    Headache    a. Possible tension HA versus migraine variant.   Hyperlipidemia    Hypertension    a. Labile BP.   Hyponatremia    TIA (transient ischemic attack)      No Known Allergies   Current Outpatient Medications  Medication Sig Dispense Refill   aspirin EC 81 MG tablet Take 81 mg by mouth daily.     ezetimibe (ZETIA) 10 MG tablet Take 1 tablet (10 mg total) by mouth daily. 7 tablet 0   Multiple Vitamin (MULTIVITAMIN) capsule Take 1 capsule by mouth  daily.     nitroGLYCERIN (NITROSTAT) 0.4 MG SL tablet Place 1 tablet (0.4 mg total) under the tongue every 5 (five) minutes as needed for chest pain. (Patient not taking: No sig reported) 25 tablet 3   olmesartan (BENICAR) 20 MG tablet Take 20 mg by mouth daily.     olmesartan (BENICAR) 5 MG tablet Take 5 mg by mouth daily as needed.     RABEprazole (ACIPHEX) 20 MG tablet Take 20 mg by mouth daily.     senna-docusate (SENOKOT-S) 8.6-50 MG tablet Take 2 tablets by mouth at bedtime. For AFTER surgery, do not take if having diarrhea 30 tablet 0   traMADol (ULTRAM) 50 MG tablet Take 1 tablet (50 mg total) by mouth every 12 (twelve) hours as needed for severe pain. For AFTER surgery, do not take and drive 10 tablet 0   No current facility-administered medications for this visit.     Past Surgical History:  Procedure Laterality Date   CATARACT EXTRACTION     CESAREAN SECTION  1973   CHOLECYSTECTOMY     WRIST SURGERY  2007   Right     No Known Allergies    Family History  Problem Relation Age of Onset   Stroke  Other    Heart failure Other        CHF     Social History Ms. Sotero reports that she has never smoked. She has never used smokeless tobacco. Ms. Aufiero reports no history of alcohol use.   Review of Systems CONSTITUTIONAL: No weight loss, fever, chills, weakness or fatigue.  HEENT: Eyes: No visual loss, blurred vision, double vision or yellow sclerae.No hearing loss, sneezing, congestion, runny nose or sore throat.  SKIN: No rash or itching.  CARDIOVASCULAR: per hpi RESPIRATORY: No shortness of breath, cough or sputum.  GASTROINTESTINAL: No anorexia, nausea, vomiting or diarrhea. No abdominal pain or blood.  GENITOURINARY: No burning on urination, no polyuria NEUROLOGICAL: No headache, dizziness, syncope, paralysis, ataxia, numbness or tingling in the extremities. No change in bowel or bladder control.  MUSCULOSKELETAL: No muscle, back pain, joint pain or stiffness.   LYMPHATICS: No enlarged nodes. No history of splenectomy.  PSYCHIATRIC: No history of depression or anxiety.  ENDOCRINOLOGIC: No reports of sweating, cold or heat intolerance. No polyuria or polydipsia.  Marland Kitchen   Physical Examination Today's Vitals   05/10/21 1121  BP: (!) 150/70  Pulse: 66  SpO2: 94%  Weight: 95 lb 6.4 oz (43.3 kg)  Height: 5' (1.524 m)   Body mass index is 18.63 kg/m.  Gen: resting comfortably, no acute distress HEENT: no scleral icterus, pupils equal round and reactive, no palptable cervical adenopathy,  CV: RRR, 3/6 systolic murmur,no jvd Resp: Clear to auscultation bilaterally GI: abdomen is soft, non-tender, non-distended, normal bowel sounds, no hepatosplenomegaly MSK: extremities are warm, no edema.  Skin: warm, no rash Neuro:  no focal deficits Psych: appropriate affect   Diagnostic Studies 03/2021 echo IMPRESSIONS     1. Left ventricular ejection fraction, by estimation, is 60 to 65%. The  left ventricle has normal function. The left ventricle has no regional  wall motion abnormalities. Left ventricular diastolic parameters are  consistent with Grade I diastolic  dysfunction (impaired relaxation).   2. Right ventricular systolic function is normal. The right ventricular  size is normal. There is normal pulmonary artery systolic pressure.   3. Left atrial size was mildly dilated.   4. Right atrial size was mildly dilated.   5. The mitral valve is abnormal. Mild mitral valve regurgitation. No  evidence of mitral stenosis.   6. The aortic valve is tricuspid. There is severe calcifcation of the  aortic valve. There is severe thickening of the aortic valve. Aortic valve  regurgitation is mild to moderate. Moderate to severe aortic valve  stenosis. Aortic valve mean gradient  measures 31.0 mmHg. Aortic valve peak gradient measures 43.8 mmHg. Aortic  valve area, by VTI measures 0.90 cm.   7. The inferior vena cava is normal in size with greater  than 50%  respiratory variability, suggesting right atrial pressure of 3 mmHg.     Assessment and Plan   1. Aortic stenosis  -moderate to severe AS, more favoring the moderate end of the spectrum - no significant symptoms - repeat echo 6 months - at this time valve would not be prohibitive for her surgery   2. Preoperative evaluation - recommend proceeding with surgery as planned, recent echo does not show her valve is at the point to prohibiti procedure, she is also asymptomatic and for her age tolerates fairlty high level of exertion without symptoms      Arnoldo Lenis, M.D.

## 2021-05-10 NOTE — Patient Instructions (Signed)
Medication Instructions:  Continue all current medications.  Labwork: none  Testing/Procedures: Your physician has requested that you have an echocardiogram. Echocardiography is a painless test that uses sound waves to create images of your heart. It provides your doctor with information about the size and shape of your heart and how well your heart's chambers and valves are working. This procedure takes approximately one hour. There are no restrictions for this procedure - DUE JUST PRIOR TO NEXT OFFICE VISIT  Follow-Up: Your physician wants you to follow up in: 6 months.  You will receive a reminder letter in the mail one-two months in advance.  If you don't receive a letter, please call our office to schedule the follow up appointment   Any Other Special Instructions Will Be Listed Below (If Applicable).   If you need a refill on your cardiac medications before your next appointment, please call your pharmacy.

## 2021-05-11 ENCOUNTER — Other Ambulatory Visit: Payer: Medicare Other

## 2021-05-16 NOTE — Patient Instructions (Addendum)
DUE TO COVID-19 ONLY ONE VISITOR IS ALLOWED TO COME WITH YOU AND STAY IN THE WAITING ROOM ONLY DURING PRE OP AND PROCEDURE.   **NO VISITORS ARE ALLOWED IN THE SHORT STAY AREA OR RECOVERY ROOM!!**  IF YOU WILL BE ADMITTED INTO THE HOSPITAL YOU ARE ALLOWED ONLY TWO SUPPORT PEOPLE DURING VISITATION HOURS ONLY (7 AM -8PM)    Up to two visitors ages 22+ are allowed at one time in a patient's room.  The visitors may rotate out with other people throughout the day.  Additionally, up to two children between the ages of 36 and 26 are allowed and do not count toward the number of allowed visitors.  Children within this age range must be accompanied by an adult visitor.  One adult visitor may remain with the patient overnight and must be in the room by 8 PM.  COVID SWAB TESTING MUST BE COMPLETED ON:  Tuesday, 05-24-21, Between the hours of 8 and 3  **MUST PRESENT COMPLETED FORM AT TESTING SITE**    [place]  [place] (backside of the building)  You are not required to quarantine, however you are required to wear a well-fitted mask when you are out and around people not in your household.  Hand Hygiene often Do NOT share personal items Notify your provider if you are in close contact with someone who has COVID or you develop fever 100.4 or greater, new onset of sneezing, cough, sore throat, shortness of breath or body aches.        Your procedure is scheduled on:  Thursday, 05-26-21   Report to [facility] Main  Entrance     Report to admitting at 6:30 AM   Call this number if you have problems the morning of surgery [phone]   Light diet day before surgery (avoid gas producing foods)   Do not eat food :After Midnight.   May have liquids until 5:40 AM day of surgery  CLEAR LIQUID DIET  Foods Allowed                                                                     Foods Excluded  Water, Black Coffee (no milk/no creamer) and tea, regular and decaf                               liquids that you cannot  Plain Jell-O in any flavor  (No red)                         see through such as: Fruit ices (not with fruit pulp)                                 milk, soups, orange juice  Iced Popsicles (No red)                                    All solid food  Apple juices Sports drinks like Gatorade (No red) Lightly seasoned clear broth or consume(fat free) Sugar     Complete one Ensure drink the morning of surgery at 5:40 AM the day of surgery.     The day of surgery:  Drink ONE (1) Pre-Surgery Clear Ensure the morning of surgery. Drink in one sitting. Do not sip.  This drink was given to you during your hospital  pre-op appointment visit. Nothing else to drink after completing the Pre-Surgery Clear Ensure .          If you have questions, please contact your surgeon's office.     Oral Hygiene is also important to reduce your risk of infection.                                    Remember - BRUSH YOUR TEETH THE MORNING OF SURGERY WITH YOUR REGULAR TOOTHPASTE   Do NOT smoke after Midnight   Take these medicines the morning of surgery with A SIP OF WATER:  Tylenol, Zetia, Aciphex                    Aspirin -  Patient will stop on 05/23/21   Stop all vitamins and herbal supplements a week before surgery             You may not have any metal on your body including hair pins, jewelry, and body piercing             Do not wear make-up, lotions, powders, perfumes or deodorant  Do not wear nail polish including gel and S&S, artificial/acrylic nails, or any other type of covering on natural nails including finger and toenails. If you have artificial nails, gel coating, etc. that needs to be removed by a nail salon please have this removed prior to surgery or surgery may need to be canceled/ delayed if the surgeon/ anesthesia feels like they are unable to be safely monitored.   Do not shave  48 hours prior to surgery.         Do  not bring valuables to the hospital. [place].   Contacts, dentures or bridgework may not be worn into surgery.   Bring small overnight bag day of surgery.    Please read over the following fact sheets you were given: IF YOU HAVE QUESTIONS ABOUT YOUR PRE OP INSTRUCTIONS PLEASE CALL [place] - Preparing for Surgery Before surgery, you can play an important role.  Because skin is not sterile, your skin needs to be as free of germs as possible.  You can reduce the number of germs on your skin by washing with CHG (chlorahexidine gluconate) soap before surgery.  CHG is an antiseptic cleaner which kills germs and bonds with the skin to continue killing germs even after washing. Please DO NOT use if you have an allergy to CHG or antibacterial soaps.  If your skin becomes reddened/irritated stop using the CHG and inform your nurse when you arrive at Short Stay. Do not shave (including legs and underarms) for at least 48 hours prior to the first CHG shower.  You may shave your face/neck.  Please follow these instructions carefully:  1.  Shower with CHG Soap the night before surgery and the  morning of surgery.  2.  If you choose to wash your hair, wash your hair  first as usual with your normal  shampoo.  3.  After you shampoo, rinse your hair and body thoroughly to remove the shampoo.                             4.  Use CHG as you would any other liquid soap.  You can apply chg directly to the skin and wash.  Gently with a scrungie or clean washcloth.  5.  Apply the CHG Soap to your body ONLY FROM THE NECK DOWN.   Do   not use on face/ open                           Wound or open sores. Avoid contact with eyes, ears mouth and   genitals (private parts).                       Wash face,  Genitals (private parts) with your normal soap.             6.  Wash thoroughly, paying special attention to the area where your    surgery  will be  performed.  7.  Thoroughly rinse your body with warm water from the neck down.  8.  DO NOT shower/wash with your normal soap after using and rinsing off the CHG Soap.                9.  Pat yourself dry with a clean towel.            10.  Wear clean pajamas.            11.  Place clean sheets on your bed the night of your first shower and do not  sleep with pets. Day of Surgery : Do not apply any lotions/deodorants the morning of surgery.  Please wear clean clothes to the hospital/surgery center.  FAILURE TO FOLLOW THESE INSTRUCTIONS MAY RESULT IN THE CANCELLATION OF YOUR SURGERY  PATIENT SIGNATURE_________________________________  NURSE SIGNATURE__________________________________  ________________________________________________________________________  WHAT IS A BLOOD TRANSFUSION? Blood Transfusion Information  A transfusion is the replacement of blood or some of its parts. Blood is made up of multiple cells which provide different functions. Red blood cells carry oxygen and are used for blood loss replacement. White blood cells fight against infection. Platelets control bleeding. Plasma helps clot blood. Other blood products are available for specialized needs, such as hemophilia or other clotting disorders. BEFORE THE TRANSFUSION  Who gives blood for transfusions?  Healthy volunteers who are fully evaluated to make sure their blood is safe. This is blood bank blood. Transfusion therapy is the safest it has ever been in the practice of medicine. Before blood is taken from a donor, a complete history is taken to make sure that person has no history of diseases nor engages in risky social behavior (examples are intravenous drug use or sexual activity with multiple partners). The donor's travel history is screened to minimize risk of transmitting infections, such as malaria. The donated blood is tested for signs of infectious diseases, such as HIV and hepatitis. The blood is then tested to  be sure it is compatible with you in order to minimize the chance of a transfusion reaction. If you or a relative donates blood, this is often done in anticipation of surgery and is not appropriate for emergency situations. It takes many days to process the donated  blood. RISKS AND COMPLICATIONS Although transfusion therapy is very safe and saves many lives, the main dangers of transfusion include:  Getting an infectious disease. Developing a transfusion reaction. This is an allergic reaction to something in the blood you were given. Every precaution is taken to prevent this. The decision to have a blood transfusion has been considered carefully by your caregiver before blood is given. Blood is not given unless the benefits outweigh the risks. AFTER THE TRANSFUSION Right after receiving a blood transfusion, you will usually feel much better and more energetic. This is especially true if your red blood cells have gotten low (anemic). The transfusion raises the level of the red blood cells which carry oxygen, and this usually causes an energy increase. The nurse administering the transfusion will monitor you carefully for complications. HOME CARE INSTRUCTIONS  No special instructions are needed after a transfusion. You may find your energy is better. Speak with your caregiver about any limitations on activity for underlying diseases you may have. SEEK MEDICAL CARE IF:  Your condition is not improving after your transfusion. You develop redness or irritation at the intravenous (IV) site. SEEK IMMEDIATE MEDICAL CARE IF:  Any of the following symptoms occur over the next 12 hours: Shaking chills. You have a temperature by mouth above 102 F (38.9 C), not controlled by medicine. Chest, back, or muscle pain. People around you feel you are not acting correctly or are confused. Shortness of breath or difficulty breathing. Dizziness and fainting. You get a rash or develop hives. You have a decrease in  urine output. Your urine turns a dark color or changes to pink, red, or brown. Any of the following symptoms occur over the next 10 days: You have a temperature by mouth above 102 F (38.9 C), not controlled by medicine. Shortness of breath. Weakness after normal activity. The white part of the eye turns yellow (jaundice). You have a decrease in the amount of urine or are urinating less often. Your urine turns a dark color or changes to pink, red, or brown. Document Released: 06/09/2000 Document Revised: 09/04/2011 Document Reviewed: 01/27/2008 [facility] Patient Information 2014 [place], [place].  _______________________________________________________________________

## 2021-05-16 NOTE — Progress Notes (Addendum)
COVID swab appointment:  05-24-21  COVID Vaccine Completed:  Yes x2 Date COVID Vaccine completed: Has received booster:  Yes x1 COVID vaccine manufacturer:  Moderna     Date of COVID positive in last 90 days:  No  PCP - Judd Lien, MD Cardiologist - Carlyle Dolly, MD  Cardiac clearance in Epic by Dr. Harl Bowie 04/19/21  Chest x-ray - 03-10-21 [facility] EKG - 02-26-21 [facility].  On chart Stress Test - greater than 2 years, Epic ECHO - 04-14-21 Epic Cardiac Cath - N/A Pacemaker/ICD device last checked: Spinal Cord Stimulator:  Event Monitor - greater than 2 years Epic  Sleep Study - N/A CPAP -   Fasting Blood Sugar - N/A Checks Blood Sugar _____ times a day  Blood Thinner Instructions: Aspirin Instructions:  ASA 81. Patient to take last dose on 05/23/21 Last Dose:  Activity level:  Can go up a flight of stairs and perform activities of daily living without stopping and without symptoms of chest pain.  Patient states that she does have shortness of breath with exertion but feels that she is at her baseline and it is not worse.  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   Anesthesia review:  Aortic stenosis and regurgitations, bradycardia, carotid stenosis,  HTN, hx of TIA  BP elevated at PAT appointment 191/73 and on recheck 172/82.  Patient denies chest pain, SOB or headache.  Family member with patient states that they check at home and it can be elevated at times, but usually much lower.  Patient stated that she was anxious.  Patient denies shortness of breath, fever, cough and chest pain at PAT appointment  Patient verbalized understanding of instructions that were given to them at the PAT appointment. Patient was also instructed that they will need to review over the PAT instructions again at home before surgery.

## 2021-05-17 ENCOUNTER — Encounter (HOSPITAL_COMMUNITY): Payer: Self-pay

## 2021-05-17 ENCOUNTER — Encounter (HOSPITAL_COMMUNITY)
Admission: RE | Admit: 2021-05-17 | Discharge: 2021-05-17 | Disposition: A | Discharge status: Home / Self Care | Payer: Medicare Other | Source: Ambulatory Visit | Attending: Gynecologic Oncology | Admitting: Gynecologic Oncology

## 2021-05-17 ENCOUNTER — Other Ambulatory Visit: Payer: Self-pay

## 2021-05-17 DIAGNOSIS — N9489 Other specified conditions associated with female genital organs and menstrual cycle: Secondary | ICD-10-CM

## 2021-05-17 DIAGNOSIS — Z01812 Encounter for preprocedural laboratory examination: Secondary | ICD-10-CM | POA: Insufficient documentation

## 2021-05-17 HISTORY — DX: Dyspnea, unspecified: R06.00

## 2021-05-17 LAB — BASIC METABOLIC PANEL
Anion gap: 7 (ref 5–15)
BUN: 13 mg/dL (ref 8–23)
CO2: 28 mmol/L (ref 22–32)
Calcium: 9.4 mg/dL (ref 8.9–10.3)
Chloride: 101 mmol/L (ref 98–111)
Creatinine, Ser: 0.79 mg/dL (ref 0.44–1.00)
GFR, Estimated: >60 mL/min (ref >60)
Glucose, Bld: 104 mg/dL — ABNORMAL HIGH (ref 70–99)
Potassium: 4.5 mmol/L (ref 3.5–5.1)
Sodium: 136 mmol/L (ref 135–145)

## 2021-05-17 LAB — URINALYSIS, ROUTINE W REFLEX MICROSCOPIC
Bacteria, UA: NONE SEEN
Bilirubin Urine: NEGATIVE
Glucose, UA: NEGATIVE mg/dL
Ketones, ur: NEGATIVE mg/dL
Nitrite: NEGATIVE
Protein, ur: NEGATIVE mg/dL
Specific Gravity, Urine: 1.005 (ref 1.005–1.030)
pH: 6 (ref 5.0–8.0)

## 2021-05-17 LAB — CBC
HCT: 33.8 % — ABNORMAL LOW (ref 36.0–46.0)
Hemoglobin: 11.4 g/dL — ABNORMAL LOW (ref 12.0–15.0)
MCH: 30 pg (ref 26.0–34.0)
MCHC: 33.7 g/dL (ref 30.0–36.0)
MCV: 88.9 fL (ref 80.0–100.0)
Platelets: 263 10*3/uL (ref 150–400)
RBC: 3.8 MIL/uL — ABNORMAL LOW (ref 3.87–5.11)
RDW: 13.5 % (ref 11.5–15.5)
WBC: 6.2 10*3/uL (ref 4.0–10.5)
nRBC: 0 % (ref 0.0–0.2)

## 2021-05-18 LAB — CEA: CEA: 1.9 ng/mL (ref 0.0–4.7)

## 2021-05-18 NOTE — Progress Notes (Signed)
Anesthesia Chart Review   Case: [id number] Date/Time: 05/26/21 8657   Procedures:      XI ROBOTIC ASSISTED SALPINGO OOPHORECTOMY (Bilateral)     POSSIBLE XI ROBOTIC ASSISTED TOTAL HYSTERECTOMY AND POSSIBLE LAPAROTOMY AND POSSIBLE STAGING   Anesthesia type: General   Pre-op diagnosis: ADNEXAL MASS   Location: [facility] ROOM 05 / [facility]   Surgeons: Lafonda Mosses, MD       DISCUSSION:87 y.o. never smoker with h/o HTN, moderate to severe AS (mean gradient 31.0 mmHg, valve area 0.90 cm2), adnexal mass scheduled for above procedure 05/26/21 with Dr. Jeral Pinch.   Pt seen by cardiology 05/10/2021. Per OV note, "recommend proceeding with surgery as planned, recent echo does not show her valve is at the point to prohibiti procedure, she is also asymptomatic and for her age tolerates fairlty high level of exertion without symptoms"  Anticipate pt can proceed with planned procedure barring acute status change.   VS: BP (!) 172/82   Pulse 70   Temp 36.7 C (Oral)   Resp 18   Ht 5' (1.524 m)   Wt 42.3 kg   SpO2 100%   BMI 18.20 kg/m   PROVIDERS: Burdine, Virgina Evener, MD is PCP   Carlyle Dolly, MD is Cardiologist  LABS: Labs reviewed: Acceptable for surgery. (all labs ordered are listed, but only abnormal results are displayed)  Labs Reviewed  CBC - Abnormal; Notable for the following components:      Result Value   RBC 3.80 (*)    Hemoglobin 11.4 (*)    HCT 33.8 (*)    All other components within normal limits  BASIC METABOLIC PANEL - Abnormal; Notable for the following components:   Glucose, Bld 104 (*)    All other components within normal limits  URINALYSIS, ROUTINE W REFLEX MICROSCOPIC - Abnormal; Notable for the following components:   Color, Urine STRAW (*)    Hgb urine dipstick SMALL (*)    Leukocytes,Ua TRACE (*)    All other components within normal limits  CEA  TYPE AND SCREEN     IMAGES:   EKG: 10/02/2020 Rate 48 bpm  Sinus bradycardia Otherwise normal  ECG No significant change found  CV: Echo 04/14/2021  1. Left ventricular ejection fraction, by estimation, is 60 to 65%. The  left ventricle has normal function. The left ventricle has no regional  wall motion abnormalities. Left ventricular diastolic parameters are  consistent with Grade I diastolic  dysfunction (impaired relaxation).   2. Right ventricular systolic function is normal. The right ventricular  size is normal. There is normal pulmonary artery systolic pressure.   3. Left atrial size was mildly dilated.   4. Right atrial size was mildly dilated.   5. The mitral valve is abnormal. Mild mitral valve regurgitation. No  evidence of mitral stenosis.   6. The aortic valve is tricuspid. There is severe calcifcation of the  aortic valve. There is severe thickening of the aortic valve. Aortic valve  regurgitation is mild to moderate. Moderate to severe aortic valve  stenosis. Aortic valve mean gradient  measures 31.0 mmHg. Aortic valve peak gradient measures 43.8 mmHg. Aortic  valve area, by VTI measures 0.90 cm.   7. The inferior vena cava is normal in size with greater than 50%  respiratory variability, suggesting right atrial pressure of 3 mmHg. Past Medical History:  Diagnosis Date   Acoustic neuroma [facility])    Aortic regurgitation    a. Seen on 2D echo in 2013 (moderate)  but not mentioned in 11/2013 echo.   Aortic stenosis    a. 2D Echo 11/2013: mild AS.   Carotid stenosis    a. Duplex 1-39% BICA in 11/2013.   Dyspnea    Generalized anxiety disorder    Headache    a. Possible tension HA versus migraine variant.   Hyperlipidemia    Hypertension    a. Labile BP.   Hyponatremia    TIA (transient ischemic attack)     Past Surgical History:  Procedure Laterality Date   CATARACT EXTRACTION     CESAREAN SECTION  1973   CHOLECYSTECTOMY     WRIST SURGERY  2007   Right    MEDICATIONS:  acetaminophen (TYLENOL) 325 MG tablet   aspirin EC 81 MG tablet   ezetimibe  (ZETIA) 10 MG tablet   Multiple Vitamin (MULTIVITAMIN) capsule   nitroGLYCERIN (NITROSTAT) 0.4 MG SL tablet   olmesartan (BENICAR) 20 MG tablet   olmesartan (BENICAR) 5 MG tablet   polyvinyl alcohol (LIQUIFILM TEARS) 1.4 % ophthalmic solution   RABEprazole (ACIPHEX) 20 MG tablet   senna-docusate (SENOKOT-S) 8.6-50 MG tablet   traMADol (ULTRAM) 50 MG tablet   No current facility-administered medications for this encounter.     Konrad Felix Ward, PA-C [facility] Pre-Surgical Testing [phone]

## 2021-05-18 NOTE — Anesthesia Preprocedure Evaluation (Addendum)
Anesthesia Evaluation  Patient identified by MRN, date of birth, ID band Patient awake    Reviewed: Allergy & Precautions, NPO status , Patient's Chart, lab work & pertinent test results  History of Anesthesia Complications Negative for: history of anesthetic complications  Airway Mallampati: II  TM Distance: >3 FB Neck ROM: Full    Dental no notable dental hx. (+) Dental Advisory Given   Pulmonary neg pulmonary ROS,    Pulmonary exam normal        Cardiovascular hypertension, Pt. on medications Normal cardiovascular exam+ Valvular Problems/Murmurs AS  + Systolic murmurs Pt seen by cardiology 05/10/2021. Per OV note, "recommend proceeding with surgery as planned, recent echo does not show her valve is at the point to prohibiti procedure, she is also asymptomatic and for her age tolerates fairlty high level of exertion without symptoms" Echo 04/14/2021 1. Left ventricular ejection fraction, by estimation, is 60 to 65%. The  left ventricle has normal function. The left ventricle has no regional  wall motion abnormalities. Left ventricular diastolic parameters are  consistent with Grade I diastolic  dysfunction (impaired relaxation).  2. Right ventricular systolic function is normal. The right ventricular  size is normal. There is normal pulmonary artery systolic pressure.  3. Left atrial size was mildly dilated.  4. Right atrial size was mildly dilated.  5. The mitral valve is abnormal. Mild mitral valve regurgitation. No  evidence of mitral stenosis.  6. The aortic valve is tricuspid. There is severe calcifcation of the  aortic valve. There is severe thickening of the aortic valve. Aortic valve  regurgitation is mild to moderate. Moderate to severe aortic valve  stenosis. Aortic valve mean gradient  measures 31.0 mmHg. Aortic valve peak gradient measures 43.8 mmHg. Aortic  valve area, by VTI measures 0.90 cm.  7. The  inferior vena cava is normal in size with greater than 50%  respiratory variability, suggesting right atrial pressure of 3 mmHg.   Neuro/Psych PSYCHIATRIC DISORDERS Anxiety TIA   GI/Hepatic negative GI ROS, Neg liver ROS,   Endo/Other  negative endocrine ROS  Renal/GU negative Renal ROS     Musculoskeletal negative musculoskeletal ROS (+)   Abdominal   Peds  Hematology negative hematology ROS (+) anemia ,   Anesthesia Other Findings   Reproductive/Obstetrics                           Anesthesia Physical Anesthesia Plan  ASA: 3  Anesthesia Plan: General   Post-op Pain Management: Tylenol PO (pre-op) and Lidocaine infusion   Induction:   PONV Risk Score and Plan: 4 or greater and Ondansetron, Dexamethasone and Treatment may vary due to age or medical condition  Airway Management Planned: Oral ETT  Additional Equipment: Arterial line  Intra-op Plan:   Post-operative Plan: Possible Post-op intubation/ventilation  Informed Consent: I have reviewed the patients History and Physical, chart, labs and discussed the procedure including the risks, benefits and alternatives for the proposed anesthesia with the patient or authorized representative who has indicated his/her understanding and acceptance.     Dental advisory given  Plan Discussed with: Anesthesiologist and CRNA  Anesthesia Plan Comments: (See PAT note 05/17/21, Konrad Felix Ward, PA-C)      Anesthesia Quick Evaluation

## 2021-05-24 ENCOUNTER — Other Ambulatory Visit: Payer: Self-pay | Admitting: Gynecologic Oncology

## 2021-05-25 ENCOUNTER — Telehealth: Payer: Self-pay

## 2021-05-25 LAB — SARS CORONAVIRUS 2 (TAT 6-24 HRS): SARS Coronavirus 2: NEGATIVE

## 2021-05-25 NOTE — Telephone Encounter (Signed)
Spoke with Patients friend Jana Half. Telephone call to check on pre-operative status.  Patient compliant with pre-operative instructions.  Reinforced nothing to eat after midnight, clear liquids until 0530.  No questions or concerns voiced.  Instructed to call for any needs.

## 2021-05-26 ENCOUNTER — Ambulatory Visit (HOSPITAL_COMMUNITY)
Admission: RE | Admit: 2021-05-26 | Discharge: 2021-05-26 | Disposition: A | Discharge status: Home / Self Care | Payer: Medicare Other | Attending: Gynecologic Oncology | Admitting: Gynecologic Oncology

## 2021-05-26 ENCOUNTER — Encounter (HOSPITAL_COMMUNITY)
Admission: RE | Disposition: A | Discharge status: Home / Self Care | Payer: Self-pay | Source: Home / Self Care | Attending: Gynecologic Oncology

## 2021-05-26 ENCOUNTER — Ambulatory Visit (HOSPITAL_COMMUNITY): Payer: Medicare Other | Admitting: Anesthesiology

## 2021-05-26 ENCOUNTER — Encounter (HOSPITAL_COMMUNITY): Payer: Self-pay | Admitting: Gynecologic Oncology

## 2021-05-26 ENCOUNTER — Ambulatory Visit (HOSPITAL_COMMUNITY): Payer: Medicare Other | Admitting: Physician Assistant

## 2021-05-26 DIAGNOSIS — I080 Rheumatic disorders of both mitral and aortic valves: Secondary | ICD-10-CM | POA: Insufficient documentation

## 2021-05-26 DIAGNOSIS — I10 Essential (primary) hypertension: Secondary | ICD-10-CM | POA: Insufficient documentation

## 2021-05-26 DIAGNOSIS — D270 Benign neoplasm of right ovary: Secondary | ICD-10-CM | POA: Diagnosis not present

## 2021-05-26 DIAGNOSIS — N9489 Other specified conditions associated with female genital organs and menstrual cycle: Secondary | ICD-10-CM | POA: Diagnosis present

## 2021-05-26 HISTORY — PX: ROBOTIC ASSISTED SALPINGO OOPHERECTOMY: SHX6082

## 2021-05-26 LAB — TYPE AND SCREEN
ABO/RH(D): A NEG
Antibody Screen: NEGATIVE

## 2021-05-26 LAB — ABO/RH: ABO/RH(D): A NEG

## 2021-05-26 SURGERY — SALPINGO-OOPHORECTOMY, ROBOT-ASSISTED
Anesthesia: General | Laterality: Bilateral

## 2021-05-26 MED ORDER — ONDANSETRON HCL 4 MG/2ML IJ SOLN
INTRAMUSCULAR | Status: AC
  Filled 2021-05-26: qty 2

## 2021-05-26 MED ORDER — STERILE WATER FOR IRRIGATION IR SOLN
Status: DC | PRN
  Administered 2021-05-26: 1000 mL

## 2021-05-26 MED ORDER — FENTANYL CITRATE (PF) 100 MCG/2ML IJ SOLN
INTRAMUSCULAR | Status: DC | PRN
  Administered 2021-05-26 (×3): 50 ug via INTRAVENOUS
  Administered 2021-05-26: 25 ug via INTRAVENOUS

## 2021-05-26 MED ORDER — FENTANYL CITRATE PF 50 MCG/ML IJ SOSY: 50.0000 ug | PREFILLED_SYRINGE | Freq: Once | INTRAMUSCULAR | Status: AC

## 2021-05-26 MED ORDER — TRAMADOL HCL 50 MG PO TABS
50.0000 mg | ORAL_TABLET | Freq: Once | ORAL | Status: AC
  Administered 2021-05-26: 50 mg via ORAL

## 2021-05-26 MED ORDER — FENTANYL CITRATE (PF) 100 MCG/2ML IJ SOLN
INTRAMUSCULAR | Status: AC
  Filled 2021-05-26: qty 2

## 2021-05-26 MED ORDER — HEPARIN SODIUM (PORCINE) 5000 UNIT/ML IJ SOLN
5000.0000 [IU] | INTRAMUSCULAR | Status: AC
  Administered 2021-05-26: 5000 [IU] via SUBCUTANEOUS
  Filled 2021-05-26: qty 1

## 2021-05-26 MED ORDER — BUPIVACAINE HCL 0.25 % IJ SOLN
INTRAMUSCULAR | Status: DC | PRN
  Administered 2021-05-26: 32 mL

## 2021-05-26 MED ORDER — PHENYLEPHRINE HCL (PRESSORS) 10 MG/ML IV SOLN
INTRAVENOUS | Status: AC
  Filled 2021-05-26: qty 1

## 2021-05-26 MED ORDER — DEXAMETHASONE SODIUM PHOSPHATE 10 MG/ML IJ SOLN
INTRAMUSCULAR | Status: AC
  Filled 2021-05-26: qty 1

## 2021-05-26 MED ORDER — PHENYLEPHRINE HCL-NACL 20-0.9 MG/250ML-% IV SOLN
INTRAVENOUS | Status: DC | PRN
  Administered 2021-05-26: 30 ug/min via INTRAVENOUS

## 2021-05-26 MED ORDER — ACETAMINOPHEN 500 MG PO TABS
1000.0000 mg | ORAL_TABLET | ORAL | Status: AC
  Administered 2021-05-26: 1000 mg via ORAL
  Filled 2021-05-26: qty 2

## 2021-05-26 MED ORDER — LACTATED RINGERS IV SOLN: INTRAVENOUS | Status: DC | PRN

## 2021-05-26 MED ORDER — FENTANYL CITRATE PF 50 MCG/ML IJ SOSY
PREFILLED_SYRINGE | INTRAMUSCULAR | Status: AC
  Administered 2021-05-26: 50 ug via INTRAVENOUS
  Filled 2021-05-26: qty 2

## 2021-05-26 MED ORDER — DEXAMETHASONE SODIUM PHOSPHATE 4 MG/ML IJ SOLN: 4.0000 mg | INTRAMUSCULAR | Status: DC

## 2021-05-26 MED ORDER — TRAMADOL HCL 50 MG PO TABS
ORAL_TABLET | ORAL | Status: AC
  Filled 2021-05-26: qty 1

## 2021-05-26 MED ORDER — ETOMIDATE 2 MG/ML IV SOLN
INTRAVENOUS | Status: DC | PRN
  Administered 2021-05-26: 12 mg via INTRAVENOUS

## 2021-05-26 MED ORDER — CHLORHEXIDINE GLUCONATE 0.12 % MT SOLN
15.0000 mL | Freq: Once | OROMUCOSAL | Status: AC
  Administered 2021-05-26: 15 mL via OROMUCOSAL

## 2021-05-26 MED ORDER — PROPOFOL 10 MG/ML IV BOLUS
INTRAVENOUS | Status: AC
  Filled 2021-05-26: qty 20

## 2021-05-26 MED ORDER — ENSURE PRE-SURGERY PO LIQD
296.0000 mL | Freq: Once | ORAL | Status: DC
  Filled 2021-05-26: qty 296

## 2021-05-26 MED ORDER — AMISULPRIDE (ANTIEMETIC) 5 MG/2ML IV SOLN: 10.0000 mg | Freq: Once | INTRAVENOUS | Status: DC | PRN

## 2021-05-26 MED ORDER — BUPIVACAINE HCL 0.25 % IJ SOLN
INTRAMUSCULAR | Status: AC
  Filled 2021-05-26: qty 1

## 2021-05-26 MED ORDER — SUGAMMADEX SODIUM 200 MG/2ML IV SOLN
INTRAVENOUS | Status: DC | PRN
Start: 2021-05-26 — End: 2021-05-26
  Administered 2021-05-26: 90 mg via INTRAVENOUS

## 2021-05-26 MED ORDER — LIDOCAINE 2% (20 MG/ML) 5 ML SYRINGE
INTRAMUSCULAR | Status: DC | PRN
  Administered 2021-05-26: 40 mg via INTRAVENOUS

## 2021-05-26 MED ORDER — DEXAMETHASONE SODIUM PHOSPHATE 10 MG/ML IJ SOLN
INTRAMUSCULAR | Status: DC | PRN
  Administered 2021-05-26: 4 mg via INTRAVENOUS

## 2021-05-26 MED ORDER — ETOMIDATE 2 MG/ML IV SOLN
INTRAVENOUS | Status: AC
  Filled 2021-05-26: qty 10

## 2021-05-26 MED ORDER — ONDANSETRON HCL 4 MG/2ML IJ SOLN
INTRAMUSCULAR | Status: DC | PRN
  Administered 2021-05-26: 4 mg via INTRAVENOUS

## 2021-05-26 MED ORDER — ORAL CARE MOUTH RINSE: 15.0000 mL | Freq: Once | OROMUCOSAL | Status: AC

## 2021-05-26 MED ORDER — LACTATED RINGERS IR SOLN
Status: DC | PRN
  Administered 2021-05-26: 1

## 2021-05-26 MED ORDER — FENTANYL CITRATE PF 50 MCG/ML IJ SOSY: 25.0000 ug | PREFILLED_SYRINGE | INTRAMUSCULAR | Status: DC | PRN

## 2021-05-26 MED ORDER — LACTATED RINGERS IV SOLN
INTRAVENOUS | Status: DC
  Administered 2021-05-26: 1000 mL via INTRAVENOUS

## 2021-05-26 MED ORDER — ROCURONIUM BROMIDE 10 MG/ML (PF) SYRINGE
PREFILLED_SYRINGE | INTRAVENOUS | Status: AC
  Filled 2021-05-26: qty 10

## 2021-05-26 MED ORDER — ROCURONIUM BROMIDE 10 MG/ML (PF) SYRINGE
PREFILLED_SYRINGE | INTRAVENOUS | Status: DC | PRN
  Administered 2021-05-26: 60 mg via INTRAVENOUS

## 2021-05-26 MED ORDER — LIDOCAINE HCL (PF) 2 % IJ SOLN
INTRAMUSCULAR | Status: AC
  Filled 2021-05-26: qty 5

## 2021-05-26 MED ORDER — PROMETHAZINE HCL 25 MG/ML IJ SOLN: 6.2500 mg | INTRAMUSCULAR | Status: DC | PRN

## 2021-05-26 SURGICAL SUPPLY — 73 items
ADH SKN CLS APL DERMABOND .7 (GAUZE/BANDAGES/DRESSINGS) ×2
AGENT HMST KT MTR STRL THRMB (HEMOSTASIS)
APL ESCP 34 STRL LF DISP (HEMOSTASIS)
APPLICATOR SURGIFLO ENDO (HEMOSTASIS) IMPLANT
BAG COUNTER SPONGE SURGICOUNT (BAG) IMPLANT
BAG LAPAROSCOPIC 12 15 PORT 16 (BASKET) ×1 IMPLANT
BAG RETRIEVAL 12/15 (BASKET) ×3
BAG SPEC RTRVL LRG 6X4 10 (ENDOMECHANICALS) ×2
BAG SPNG CNTER NS LX DISP (BAG)
BLADE SURG SZ10 CARB STEEL (BLADE) IMPLANT
COVER BACK TABLE 60X90IN (DRAPES) ×3 IMPLANT
COVER TIP SHEARS 8 DVNC (MISCELLANEOUS) ×2 IMPLANT
COVER TIP SHEARS 8MM DA VINCI (MISCELLANEOUS) ×3
DECANTER SPIKE VIAL GLASS SM (MISCELLANEOUS) ×3 IMPLANT
DERMABOND ADVANCED (GAUZE/BANDAGES/DRESSINGS) ×1
DERMABOND ADVANCED .7 DNX12 (GAUZE/BANDAGES/DRESSINGS) ×2 IMPLANT
DEVICE TROCAR PUNCTURE CLOSURE (ENDOMECHANICALS) ×2 IMPLANT
DRAPE ARM DVNC X/XI (DISPOSABLE) ×8 IMPLANT
DRAPE COLUMN DVNC XI (DISPOSABLE) ×2 IMPLANT
DRAPE DA VINCI XI ARM (DISPOSABLE) ×12
DRAPE DA VINCI XI COLUMN (DISPOSABLE) ×3
DRAPE SHEET LG 3/4 BI-LAMINATE (DRAPES) ×3 IMPLANT
DRAPE SURG IRRIG POUCH 19X23 (DRAPES) ×3 IMPLANT
DRSG OPSITE POSTOP 4X6 (GAUZE/BANDAGES/DRESSINGS) IMPLANT
DRSG OPSITE POSTOP 4X8 (GAUZE/BANDAGES/DRESSINGS) IMPLANT
ELECT PENCIL ROCKER SW 15FT (MISCELLANEOUS) IMPLANT
ELECT REM PT RETURN 15FT ADLT (MISCELLANEOUS) ×3 IMPLANT
GAUZE 4X4 16PLY ~~LOC~~+RFID DBL (SPONGE) ×3 IMPLANT
GLOVE SURG ENC MOIS LTX SZ6 (GLOVE) ×12 IMPLANT
GLOVE SURG ENC MOIS LTX SZ6.5 (GLOVE) ×6 IMPLANT
GOWN STRL REUS W/ TWL LRG LVL3 (GOWN DISPOSABLE) ×8 IMPLANT
GOWN STRL REUS W/TWL LRG LVL3 (GOWN DISPOSABLE) ×12
HOLDER FOLEY CATH W/STRAP (MISCELLANEOUS) ×1 IMPLANT
IRRIG SUCT STRYKERFLOW 2 WTIP (MISCELLANEOUS) ×3
IRRIGATION SUCT STRKRFLW 2 WTP (MISCELLANEOUS) ×2 IMPLANT
KIT PROCEDURE DA VINCI SI (MISCELLANEOUS)
KIT PROCEDURE DVNC SI (MISCELLANEOUS) IMPLANT
KIT TURNOVER KIT A (KITS) IMPLANT
MANIPULATOR ADVINCU DEL 3.0 PL (MISCELLANEOUS) IMPLANT
MANIPULATOR UTERINE 4.5 ZUMI (MISCELLANEOUS) ×1 IMPLANT
NDL HYPO 21X1.5 SAFETY (NEEDLE) ×1 IMPLANT
NDL SPNL 18GX3.5 QUINCKE PK (NEEDLE) IMPLANT
NEEDLE HYPO 21X1.5 SAFETY (NEEDLE) ×3 IMPLANT
NEEDLE SPNL 18GX3.5 QUINCKE PK (NEEDLE) IMPLANT
OBTURATOR OPTICAL STANDARD 8MM (TROCAR) ×3
OBTURATOR OPTICAL STND 8 DVNC (TROCAR) ×2
OBTURATOR OPTICALSTD 8 DVNC (TROCAR) ×2 IMPLANT
PACK ROBOT GYN CUSTOM WL (TRAY / TRAY PROCEDURE) ×3 IMPLANT
PAD POSITIONING PINK XL (MISCELLANEOUS) ×3 IMPLANT
PORT ACCESS TROCAR AIRSEAL 12 (TROCAR) ×2 IMPLANT
PORT ACCESS TROCAR AIRSEAL 5M (TROCAR) ×1
POUCH SPECIMEN RETRIEVAL 10MM (ENDOMECHANICALS) ×2 IMPLANT
SCRUB EXIDINE 4% CHG 4OZ (MISCELLANEOUS) ×3 IMPLANT
SEAL CANN UNIV 5-8 DVNC XI (MISCELLANEOUS) ×8 IMPLANT
SEAL XI 5MM-8MM UNIVERSAL (MISCELLANEOUS) ×12
SET TRI-LUMEN FLTR TB AIRSEAL (TUBING) ×3 IMPLANT
SPONGE T-LAP 18X18 ~~LOC~~+RFID (SPONGE) IMPLANT
SURGIFLO W/THROMBIN 8M KIT (HEMOSTASIS) IMPLANT
SUT MNCRL AB 4-0 PS2 18 (SUTURE) IMPLANT
SUT PDS AB 1 TP1 96 (SUTURE) IMPLANT
SUT VIC AB 0 CT1 27 (SUTURE)
SUT VIC AB 0 CT1 27XBRD ANTBC (SUTURE) IMPLANT
SUT VIC AB 2-0 CT1 27 (SUTURE)
SUT VIC AB 2-0 CT1 TAPERPNT 27 (SUTURE) IMPLANT
SUT VIC AB 4-0 PS2 18 (SUTURE) ×6 IMPLANT
SUT VICRYL 0 UR6 27IN ABS (SUTURE) ×2 IMPLANT
TOWEL OR NON WOVEN STRL DISP B (DISPOSABLE) ×3 IMPLANT
TRAP SPECIMEN MUCUS 40CC (MISCELLANEOUS) IMPLANT
TRAY FOLEY MTR SLVR 16FR STAT (SET/KITS/TRAYS/PACK) ×3 IMPLANT
TROCAR XCEL NON-BLD 5MMX100MML (ENDOMECHANICALS) IMPLANT
UNDERPAD 30X36 HEAVY ABSORB (UNDERPADS AND DIAPERS) ×6 IMPLANT
WATER STERILE IRR 1000ML POUR (IV SOLUTION) ×3 IMPLANT
YANKAUER SUCT BULB TIP 10FT TU (MISCELLANEOUS) IMPLANT

## 2021-05-26 NOTE — Op Note (Signed)
OPERATIVE NOTE  Pre-operative Diagnosis: Complex adnexal mass  Post-operative Diagnosis: same, benign cyst on frozen section  Operation: Robotic-assisted laparoscopic bilateral salpingoophorectomy   Surgeon: Jeral Pinch MD  Assistant Surgeon: Lahoma Crocker MD (an MD assistant was necessary for tissue manipulation, management of robotic instrumentation, retraction and positioning due to the complexity of the case and hospital policies).   Anesthesia: GET  Urine Output: 100cc  Operative Findings: On EUA, small mobile uterus. 8-10 cm mobile and smooth mass, better appreciated with abdominal hand. On intra-abdominal entry, normal upper abdominal survey. Some filmy adhesions of the omentum to the anterior abdominal wall. Uterus 6 cm and normal in appearance. Normal left adnexa. 10 cm cystic mass of the right ovary, suspected torsion although no evidence of current torsion. Proximal fallopian tube noted, fimbriae not appreciated. No obvious intra-abdominal or pelvic evidence of disease.   Hematoma (1-2) cm noted along the left peritoneum, thought secondary to small amount of insufflation of preperitoneal tissue upon entry. No enlargement appreciated when inspected again at the end of surgery.  Estimated Blood Loss:  less than 100 mL      Total IV Fluids: see I&O flowsheet         Specimens: bilateral tubes and ovaries, pelvic washings         Complications:  None apparent; patient tolerated the procedure well.         Disposition: PACU - hemodynamically stable.  Procedure Details  The patient was seen in the Holding Room. The risks, benefits, complications, treatment options, and expected outcomes were discussed with the patient.  The patient concurred with the proposed plan, giving informed consent.  The site of surgery properly noted/marked. The patient was identified as [place] and the procedure verified as a Robotic-assisted bilateral salpingo-oophorectomy with any other  indicated procedures.   After induction of anesthesia, the patient was draped and prepped in the usual sterile manner. Patient was placed in supine position after anesthesia and draped and prepped in the usual sterile manner as follows: Her arms were tucked to her side with all appropriate precautions.  The shoulders were stabilized with padded shoulder blocks applied to the acromium processes.  The patient was placed in the semi-lithotomy position in [place].  The perineum and vagina were prepped with CholoraPrep. The patient was draped after the CholoraPrep had been allowed to dry for 3 minutes.  A Time Out was held and the above information confirmed.  The urethra was prepped with Betadine. Foley catheter was placed. OG tube placement was confirmed and to suction.   Next, a 10 mm skin incision was made 1 cm below the subcostal margin in the midclavicular line.  The 5 mm Optiview port and scope was used for direct entry.  Opening pressure was under 10 mm CO2.  The abdomen was insufflated and the findings were noted as above.   At this point and all points during the procedure, the patient's intra-abdominal pressure did not exceed 15 mmHg. Next, an 8 mm skin incision was made inferior to the umbilicus and a right and left port were placed about 8 cm lateral to the robot port on the right and left side.  A fourth arm was placed on the right.  The 5 mm assist trocar was exchanged for a 10-12 mm port. All ports were placed under direct visualization.  The patient was placed in steep Trendelenburg.  The omental adhesions were taken down sharply. The robot was docked in the normal manner.  The  right and left peritoneum were opened parallel to the IP ligament to open the retroperitoneal spaces bilaterally. The round ligaments were preserved. The ureter was noted to be on the medial leaf of the broad ligament.  The peritoneum above the ureter was incised and stretched and the infundibulopelvic ligament was  skeletonized, cauterized and cut.  The utero-ovarian ligament and fallopian tube were skeletonized, cauterized and transected just lateral to the uterine fundus, freeing the adnexa. The right adnexa was placed in a 15 mm Endocatch bag and the left in a 10 mm Endocatch bag. The 15 mm Endocatch bag was brought through the assist trocar. The cyst was decompressed in contained fashion and ultimately removed in piecemeal from the bag. The right adnexa was sent for frozen section. The leg adnexa was removed in its Endocatch bag from the assist trocar.  Irrigation was used and excellent hemostasis was achieved.  Once frozen section returned, the procedure was completed.  Robotic instruments were removed under direct visulaization.  The robot was undocked. The fascia at the 10-12 mm port was closed with 0 Vicryl using a fascial closure device.  The subcuticular tissue was closed with 4-0 Vicryl and the skin was closed with 4-0 Monocryl in a subcuticular manner.  Dermabond was applied.    The vagina was swabbed with  minimal bleeding noted. Foley catheter was removed.  All sponge, lap and needle counts were correct x  3.   The patient was transferred to the recovery room in stable condition.  Jeral Pinch, MD

## 2021-05-26 NOTE — Transfer of Care (Signed)
Immediate Anesthesia Transfer of Care Note  Patient: Kathryn Gates  Procedure(s) Performed: XI ROBOTIC ASSISTED SALPINGO OOPHORECTOMY (Bilateral)  Patient Location: PACU  Anesthesia Type:General  Level of Consciousness: drowsy  Airway & Oxygen Therapy: Patient Spontanous Breathing and Patient connected to face mask oxygen  Post-op Assessment: Report given to RN and Post -op Vital signs reviewed and stable  Post vital signs: Reviewed and stable  Last Vitals:  Vitals Value Taken Time  BP    Temp    Pulse    Resp    SpO2      Last Pain:  Vitals:   05/26/21 0655  TempSrc: Oral         Complications: No notable events documented.

## 2021-05-26 NOTE — Anesthesia Procedure Notes (Signed)
Procedure Name: Intubation Date/Time: 05/26/2021 9:09 AM Performed by: Sharlette Dense, CRNA Pre-anesthesia Checklist: Patient identified, Emergency Drugs available, Suction available and Patient being monitored Patient Re-evaluated:Patient Re-evaluated prior to induction Oxygen Delivery Method: Circle system utilized Preoxygenation: Pre-oxygenation with 100% oxygen Induction Type: IV induction Ventilation: Mask ventilation without difficulty and Oral airway inserted - appropriate to patient size Laryngoscope Size: Sabra Heck and 2 Grade View: Grade I Tube type: Oral Tube size: 7.0 mm Number of attempts: 1 Airway Equipment and Method: Stylet and Oral airway Placement Confirmation: ETT inserted through vocal cords under direct vision, positive ETCO2 and breath sounds checked- equal and bilateral Secured at: 20 cm Tube secured with: Tape Dental Injury: Teeth and Oropharynx as per pre-operative assessment

## 2021-05-26 NOTE — Anesthesia Postprocedure Evaluation (Signed)
Anesthesia Post Note  Patient: Kathryn Gates  Procedure(s) Performed: XI ROBOTIC ASSISTED SALPINGO OOPHORECTOMY (Bilateral)     Patient location during evaluation: PACU Anesthesia Type: General Level of consciousness: sedated Pain management: pain level controlled Vital Signs Assessment: post-procedure vital signs reviewed and stable Respiratory status: spontaneous breathing and respiratory function stable Cardiovascular status: stable Postop Assessment: no apparent nausea or vomiting Anesthetic complications: no   No notable events documented.  Last Vitals:  Vitals:   05/26/21 1145 05/26/21 1200  BP: (!) 168/57 (!) 129/51  Pulse: 83 63  Resp: 14 13  Temp:    SpO2: 100% 100%    Last Pain:  Vitals:   05/26/21 1200  TempSrc:   PainSc: 0-No pain                 Adden Strout DANIEL

## 2021-05-26 NOTE — Anesthesia Procedure Notes (Signed)
Arterial Line Insertion Start/End12/06/2020 8:01 AM, 05/26/2021 8:11 AM Performed by: Duane Boston, MD  Patient location: Pre-op. Preanesthetic checklist: patient identified, IV checked, site marked, risks and benefits discussed, surgical consent, monitors and equipment checked, pre-op evaluation, timeout performed and anesthesia consent Lidocaine 1% used for infiltration Right, radial was placed Catheter size: 20 Fr Hand hygiene performed  and maximum sterile barriers used   Attempts: 1 Procedure performed using ultrasound guided technique. Ultrasound Notes:anatomy identified, needle tip was noted to be adjacent to the nerve/plexus identified, no ultrasound evidence of intravascular and/or intraneural injection and image(s) printed for medical record Following insertion, dressing applied and Biopatch. Post procedure assessment: normal and unchanged  Patient tolerated the procedure well with no immediate complications.

## 2021-05-26 NOTE — Interval H&P Note (Signed)
History and Physical Interval Note:  05/26/2021 7:22 AM  Kathryn Gates  has presented today for surgery, with the diagnosis of ADNEXAL MASS.  The various methods of treatment have been discussed with the patient and family. After consideration of risks, benefits and other options for treatment, the patient has consented to  Procedure(s): XI ROBOTIC ASSISTED SALPINGO OOPHORECTOMY (Bilateral) POSSIBLE XI ROBOTIC ASSISTED TOTAL HYSTERECTOMY AND POSSIBLE LAPAROTOMY AND POSSIBLE STAGING (N/A) as a surgical intervention.  The patient's history has been reviewed, patient examined, no change in status, stable for surgery.  I have reviewed the patient's chart and labs.  Questions were answered to the patient's satisfaction.     Lafonda Mosses

## 2021-05-26 NOTE — Discharge Instructions (Addendum)

## 2021-05-27 ENCOUNTER — Telehealth: Payer: Self-pay

## 2021-05-27 ENCOUNTER — Encounter (HOSPITAL_COMMUNITY): Payer: Self-pay | Admitting: Gynecologic Oncology

## 2021-05-27 NOTE — Telephone Encounter (Signed)
Spoke with Kathryn Gates this morning. She states she is eating, drinking and urinating well. She experiencing burning with urination. Told her that is from the foley catheter that was in during her surgery. Told her to increase her fluid intake to 8 oz of water every couple of hours.  If burning has not resolved by Monday 05-30-21, she needs to call the office to come in to give a urine specimen. She has not had a BM yet but is passing gas. She has IBS and wanted to see if she will go on her own.  If no BM today, she will begin senokot-s this evening. She denies fever or chills. Incisions are dry and intact. Her pain is controlled with Tylenol and 400 mg of Ibuprofen.  Instructed to call office with any fever, chills, purulent drainage, uncontrolled pain or any other questions or concerns. Patient verbalizes understanding.  Pt aware of post op appointments as well as the office number [phone] and after hours number [phone] to call if she has any questions or concerns

## 2021-05-30 ENCOUNTER — Telehealth: Payer: Self-pay

## 2021-05-30 LAB — SURGICAL PATHOLOGY

## 2021-05-30 NOTE — Telephone Encounter (Signed)
Spoke with Kathryn Gates this afternoon regarding her surgical pathology results. Per Dr. Berline Lopes pathology is benign (No Cancer). Patient verbalized understanding and very happy with this news.  Patient reports having vaginal bleeding starting the day after surgery. It is bright red blood, no clots, no cramping and no pain. She states she soaked thru a panty liner quickly on 12/2 but bleeding has slowed down. She has not needed to change her panty liner today. Joylene John, NP notified. Instructed patient to monitor bleeding and notify our office for increased bleeding, pain or cramping. Patient has some abdominal discomfort, she describes it as a nagging pain but could not rate on a scale of 1-10. She continues to alternate tylenol and ibuprofen and feels this is sufficient for her pain. She is going to try using a heating pad as well.  Patient provided with after hours number as well as office number and to call with any needs.

## 2021-05-31 LAB — CYTOLOGY - NON PAP

## 2021-06-16 ENCOUNTER — Other Ambulatory Visit: Payer: Self-pay

## 2021-06-16 ENCOUNTER — Encounter (HOSPITAL_COMMUNITY): Payer: Self-pay

## 2021-06-16 ENCOUNTER — Encounter: Payer: Self-pay | Admitting: Gynecologic Oncology

## 2021-06-16 ENCOUNTER — Observation Stay (HOSPITAL_COMMUNITY)
Admission: EM | Admit: 2021-06-16 | Discharge: 2021-06-18 | Disposition: A | Discharge status: Home / Self Care | Payer: Medicare Other | Attending: Internal Medicine | Admitting: Internal Medicine

## 2021-06-16 DIAGNOSIS — D333 Benign neoplasm of cranial nerves: Secondary | ICD-10-CM | POA: Diagnosis not present

## 2021-06-16 DIAGNOSIS — I951 Orthostatic hypotension: Secondary | ICD-10-CM

## 2021-06-16 DIAGNOSIS — Z79899 Other long term (current) drug therapy: Secondary | ICD-10-CM | POA: Diagnosis not present

## 2021-06-16 DIAGNOSIS — R2689 Other abnormalities of gait and mobility: Secondary | ICD-10-CM | POA: Insufficient documentation

## 2021-06-16 DIAGNOSIS — E785 Hyperlipidemia, unspecified: Secondary | ICD-10-CM | POA: Diagnosis not present

## 2021-06-16 DIAGNOSIS — I351 Nonrheumatic aortic (valve) insufficiency: Secondary | ICD-10-CM | POA: Diagnosis not present

## 2021-06-16 DIAGNOSIS — Z7982 Long term (current) use of aspirin: Secondary | ICD-10-CM | POA: Insufficient documentation

## 2021-06-16 DIAGNOSIS — I10 Essential (primary) hypertension: Secondary | ICD-10-CM | POA: Diagnosis not present

## 2021-06-16 DIAGNOSIS — R001 Bradycardia, unspecified: Secondary | ICD-10-CM

## 2021-06-16 DIAGNOSIS — Z20822 Contact with and (suspected) exposure to covid-19: Secondary | ICD-10-CM | POA: Insufficient documentation

## 2021-06-16 DIAGNOSIS — E876 Hypokalemia: Secondary | ICD-10-CM | POA: Insufficient documentation

## 2021-06-16 DIAGNOSIS — K219 Gastro-esophageal reflux disease without esophagitis: Secondary | ICD-10-CM | POA: Diagnosis not present

## 2021-06-16 DIAGNOSIS — G319 Degenerative disease of nervous system, unspecified: Secondary | ICD-10-CM | POA: Insufficient documentation

## 2021-06-16 DIAGNOSIS — R531 Weakness: Secondary | ICD-10-CM | POA: Diagnosis present

## 2021-06-16 DIAGNOSIS — R4781 Slurred speech: Principal | ICD-10-CM | POA: Diagnosis present

## 2021-06-16 DIAGNOSIS — Z8673 Personal history of transient ischemic attack (TIA), and cerebral infarction without residual deficits: Secondary | ICD-10-CM | POA: Insufficient documentation

## 2021-06-16 DIAGNOSIS — I6782 Cerebral ischemia: Secondary | ICD-10-CM | POA: Insufficient documentation

## 2021-06-16 DIAGNOSIS — R5381 Other malaise: Secondary | ICD-10-CM

## 2021-06-16 LAB — CBG MONITORING, ED: Glucose-Capillary: 161 mg/dL — ABNORMAL HIGH (ref 70–99)

## 2021-06-16 NOTE — ED Triage Notes (Signed)
Pt arrived via [facility] from home with c/o generalized weakness. Pt continues to fall asleep during initial assessment. A & O x 3. Denies any pain.

## 2021-06-17 ENCOUNTER — Inpatient Hospital Stay: Payer: Medicare Other | Admitting: Gynecologic Oncology

## 2021-06-17 ENCOUNTER — Observation Stay (HOSPITAL_BASED_OUTPATIENT_CLINIC_OR_DEPARTMENT_OTHER): Payer: Medicare Other

## 2021-06-17 ENCOUNTER — Emergency Department (HOSPITAL_COMMUNITY): Payer: Medicare Other

## 2021-06-17 ENCOUNTER — Telehealth: Payer: Self-pay

## 2021-06-17 ENCOUNTER — Observation Stay (HOSPITAL_COMMUNITY): Payer: Medicare Other

## 2021-06-17 DIAGNOSIS — R4781 Slurred speech: Secondary | ICD-10-CM | POA: Diagnosis not present

## 2021-06-17 DIAGNOSIS — I10 Essential (primary) hypertension: Secondary | ICD-10-CM

## 2021-06-17 LAB — CBC WITH DIFFERENTIAL/PLATELET
Abs Immature Granulocytes: 0.02 10*3/uL (ref 0.00–0.07)
Basophils Absolute: 0.1 10*3/uL (ref 0.0–0.1)
Basophils Relative: 1 %
Eosinophils Absolute: 0.5 10*3/uL (ref 0.0–0.5)
Eosinophils Relative: 7 %
HCT: 32.5 % — ABNORMAL LOW (ref 36.0–46.0)
Hemoglobin: 10.8 g/dL — ABNORMAL LOW (ref 12.0–15.0)
Immature Granulocytes: 0 %
Lymphocytes Relative: 21 %
Lymphs Abs: 1.3 10*3/uL (ref 0.7–4.0)
MCH: 29.9 pg (ref 26.0–34.0)
MCHC: 33.2 g/dL (ref 30.0–36.0)
MCV: 90 fL (ref 80.0–100.0)
Monocytes Absolute: 0.4 10*3/uL (ref 0.1–1.0)
Monocytes Relative: 6 %
Neutro Abs: 4.3 10*3/uL (ref 1.7–7.7)
Neutrophils Relative %: 65 %
Platelets: 331 10*3/uL (ref 150–400)
RBC: 3.61 MIL/uL — ABNORMAL LOW (ref 3.87–5.11)
RDW: 13.4 % (ref 11.5–15.5)
WBC: 6.5 10*3/uL (ref 4.0–10.5)
nRBC: 0 % (ref 0.0–0.2)

## 2021-06-17 LAB — ECHOCARDIOGRAM COMPLETE
AR max vel: 0.83 cm2
AV Area VTI: 0.81 cm2
AV Area mean vel: 0.76 cm2
AV Mean grad: 20.8 mmHg
AV Peak grad: 36.8 mmHg
Ao pk vel: 3.03 m/s
Area-P 1/2: 1.79 cm2
Calc EF: 63.6 %
Height: 60 in
MV VTI: 1.56 cm2
P 1/2 time: 703 msec
S' Lateral: 2.1 cm
Single Plane A2C EF: 69.7 %
Single Plane A4C EF: 56 %
Weight: 1410.94 oz

## 2021-06-17 LAB — PROTIME-INR
INR: 1.1 (ref 0.8–1.2)
Prothrombin Time: 14 seconds (ref 11.4–15.2)

## 2021-06-17 LAB — RESP PANEL BY RT-PCR (FLU A&B, COVID) ARPGX2
Influenza A by PCR: NEGATIVE
Influenza B by PCR: NEGATIVE
SARS Coronavirus 2 by RT PCR: NEGATIVE

## 2021-06-17 LAB — I-STAT CHEM 8, ED
BUN: 17 mg/dL (ref 8–23)
Calcium, Ion: 1.16 mmol/L (ref 1.15–1.40)
Chloride: 97 mmol/L — ABNORMAL LOW (ref 98–111)
Creatinine, Ser: 0.8 mg/dL (ref 0.44–1.00)
Glucose, Bld: 158 mg/dL — ABNORMAL HIGH (ref 70–99)
HCT: 30 % — ABNORMAL LOW (ref 36.0–46.0)
Hemoglobin: 10.2 g/dL — ABNORMAL LOW (ref 12.0–15.0)
Potassium: 4.4 mmol/L (ref 3.5–5.1)
Sodium: 132 mmol/L — ABNORMAL LOW (ref 135–145)
TCO2: 29 mmol/L (ref 22–32)

## 2021-06-17 LAB — COMPREHENSIVE METABOLIC PANEL
ALT: 10 U/L (ref 0–44)
AST: 16 U/L (ref 15–41)
Albumin: 3.9 g/dL (ref 3.5–5.0)
Alkaline Phosphatase: 68 U/L (ref 38–126)
Anion gap: 8 (ref 5–15)
BUN: 15 mg/dL (ref 8–23)
CO2: 26 mmol/L (ref 22–32)
Calcium: 8.9 mg/dL (ref 8.9–10.3)
Chloride: 97 mmol/L — ABNORMAL LOW (ref 98–111)
Creatinine, Ser: 0.81 mg/dL (ref 0.44–1.00)
GFR, Estimated: >60 mL/min (ref >60)
Glucose, Bld: 165 mg/dL — ABNORMAL HIGH (ref 70–99)
Potassium: 3.4 mmol/L — ABNORMAL LOW (ref 3.5–5.1)
Sodium: 131 mmol/L — ABNORMAL LOW (ref 135–145)
Total Bilirubin: 0.5 mg/dL (ref 0.3–1.2)
Total Protein: 6.8 g/dL (ref 6.5–8.1)

## 2021-06-17 LAB — ETHANOL: Alcohol, Ethyl (B): <10 mg/dL (ref <10)

## 2021-06-17 LAB — APTT: aPTT: 27 seconds (ref 24–36)

## 2021-06-17 LAB — TROPONIN I (HIGH SENSITIVITY)
Troponin I (High Sensitivity): 24 ng/L — ABNORMAL HIGH (ref <18)
Troponin I (High Sensitivity): 17 ng/L (ref <18)

## 2021-06-17 MED ORDER — SENNOSIDES-DOCUSATE SODIUM 8.6-50 MG PO TABS: 1.0000 | ORAL_TABLET | Freq: Every evening | ORAL | Status: DC | PRN

## 2021-06-17 MED ORDER — EZETIMIBE 10 MG PO TABS: 10.0000 mg | ORAL_TABLET | ORAL | Status: DC

## 2021-06-17 MED ORDER — POTASSIUM CHLORIDE CRYS ER 20 MEQ PO TBCR
40.0000 meq | EXTENDED_RELEASE_TABLET | Freq: Once | ORAL | Status: AC
  Administered 2021-06-17: 10:00:00 40 meq via ORAL
  Filled 2021-06-17: qty 2

## 2021-06-17 MED ORDER — HEPARIN SODIUM (PORCINE) 5000 UNIT/ML IJ SOLN
5000.0000 [IU] | Freq: Three times a day (TID) | INTRAMUSCULAR | Status: DC
  Administered 2021-06-17 – 2021-06-18 (×3): 5000 [IU] via SUBCUTANEOUS
  Filled 2021-06-17 (×3): qty 1

## 2021-06-17 MED ORDER — CHLORHEXIDINE GLUCONATE CLOTH 2 % EX PADS
6.0000 | MEDICATED_PAD | Freq: Every day | CUTANEOUS | Status: DC
  Administered 2021-06-17 – 2021-06-18 (×2): 6 via TOPICAL

## 2021-06-17 MED ORDER — ACETAMINOPHEN 650 MG RE SUPP: 650.0000 mg | RECTAL | Status: DC | PRN

## 2021-06-17 MED ORDER — ACETAMINOPHEN 160 MG/5ML PO SOLN: 650.0000 mg | ORAL | Status: DC | PRN

## 2021-06-17 MED ORDER — ASPIRIN EC 81 MG PO TBEC
81.0000 mg | DELAYED_RELEASE_TABLET | Freq: Every day | ORAL | Status: DC
  Administered 2021-06-17 – 2021-06-18 (×2): 81 mg via ORAL
  Filled 2021-06-17 (×2): qty 1

## 2021-06-17 MED ORDER — GLUCAGON HCL RDNA (DIAGNOSTIC) 1 MG IJ SOLR
1.0000 mg | Freq: Once | INTRAMUSCULAR | Status: AC
  Administered 2021-06-17: 02:00:00 1 mg via INTRAVENOUS
  Filled 2021-06-17: qty 1

## 2021-06-17 MED ORDER — STROKE: EARLY STAGES OF RECOVERY BOOK: Freq: Once | Status: AC

## 2021-06-17 MED ORDER — ACETAMINOPHEN 325 MG PO TABS: 650.0000 mg | ORAL_TABLET | ORAL | Status: DC | PRN

## 2021-06-17 NOTE — Plan of Care (Signed)
°  Problem: Acute Rehab PT Goals(only PT should resolve) Goal: Pt Will Go Supine/Side To Sit Outcome: Progressing Flowsheets (Taken 06/17/2021 1647) Pt will go Supine/Side to Sit:  with supervision  with modified independence Goal: Patient Will Transfer Sit To/From Stand Outcome: Progressing [place] (Taken 06/17/2021 1647) Patient will transfer sit to/from stand: with min guard assist Goal: Pt Will Transfer Bed To Chair/Chair To Bed Outcome: Progressing Flowsheets (Taken 06/17/2021 1647) Pt will Transfer Bed to Chair/Chair to Bed: min guard assist Goal: Pt Will Ambulate Outcome: Progressing Flowsheets (Taken 06/17/2021 1647) Pt will Ambulate:  50 feet  with supervision  with min guard assist  with rolling walker   4:48 PM, 06/17/21 Lonell Grandchild, MPT Physical Therapist with [facility] [phone] office [phone] mobile phone

## 2021-06-17 NOTE — TOC Initial Note (Signed)
Transition of Care [facility]) - Initial/Assessment Note    Patient Details  Name: Kathryn Gates MRN: [id number] Date of Birth: 01/29/1930  [facility] of [facility]) CM/SW Contact:    Boneta Lucks, RN Phone Number: 06/17/2021, 3:46 PM  Clinical Narrative:   Patient admitted with slurred speech. TOC spoke with her son. PT is recommending SNF unless family can assist. Per son his mother lives with her sister. Her sister is home with hospice and they have called in the nurses, she will likely pass soon. They have sitters in the home to assist and he and his wife live across the street. He states they will have to watch her medication more closely, she will take extra medication if she feels her BP is to high, she only looks at systolic to make that decision.   [facility] past.  He states he sister declined at SNF and really wants her home. He will talk with family and wait til she is medically stable.   He is traveling back home today, he is a Administrator.        Expected Discharge Plan: [place] Barriers to Discharge: Continued Medical Work up   Patient Goals and CMS Choice Patient states their goals for this hospitalization and ongoing recovery are:: to get better CMS Medicare.gov Compare Post Acute Care list provided to:: Patient Represenative (must comment) Choice offered to / list presented to : Adult Children  Expected Discharge Plan and Services Expected Discharge Plan: [place]     Prior Living Arrangements/Services    Patient language and need for interpreter reviewed:: Yes        Need for Family Participation in Patient Care: Yes (Comment) Care giver support system in place?: Yes (comment)   Criminal Activity/Legal Involvement Pertinent to Current Situation/Hospitalization: No - Comment as needed  Activities of Daily Living Home Assistive Devices/Equipment: Cane (specify quad or straight), Walker  (specify type), Eyeglasses, Hearing aid, Grab bars in shower, Dentures (specify type) ADL Screening (condition at time of admission) Patient's cognitive ability adequate to safely complete daily activities?: Yes Is the patient deaf or have difficulty hearing?: Yes Does the patient have difficulty seeing, even when wearing glasses/contacts?: No Does the patient have difficulty concentrating, remembering, or making decisions?: No Patient able to express need for assistance with ADLs?: Yes Does the patient have difficulty dressing or bathing?: Yes Independently performs ADLs?: No Communication: Independent Dressing (OT): Independent, Needs assistance Is this a change from baseline?: Change from baseline, expected to last <3days Grooming: Appropriate for developmental age Feeding: Independent Bathing: Independent with device (comment) Toileting: Independent with device (comment) In/Out Bed: Independent with device (comment) Walks in Home: Independent with device (comment) Does the patient have difficulty walking or climbing stairs?: No Weakness of Legs: Both Weakness of Arms/Hands: Both  Permission Sought/Granted   Emotional Assessment     Alcohol / Substance Use: Not Applicable Psych Involvement: No (comment)  Admission diagnosis:  Slurred speech [R47.81] Bradycardia [R00.1] Patient Active Problem List   Diagnosis Date Noted   Slurred speech 06/17/2021   Adnexal mass 05/02/2021   Loss of weight 07/01/2020   Dysphagia 07/01/2020   Loss of taste 07/01/2020   Sinus bradycardia 10/08/2014   Headache    Generalized anxiety disorder    Carotid stenosis    Hyperlipidemia    Aortic stenosis 12/05/2013   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

## 2021-06-17 NOTE — Progress Notes (Signed)
Unable to obtain urine specimen at this time because urine contaminated with stool.

## 2021-06-17 NOTE — Progress Notes (Addendum)
Sitting position bp 93/34

## 2021-06-17 NOTE — H&P (Signed)
TRH H&P    Patient Demographics:    Kathryn Gates, is a 87 y.o. female  MRN: [id number]  DOB - 03-Sep-1929  Admit Date - 06/16/2021  Referring MD/NP/PA: Rancour  Outpatient Primary MD for the patient is Burdine, Virgina Evener, MD  Chief complaint- Slurred speech   HPI:    Kathryn Gates  is a 87 y.o. female, with history of acoustic neuroma, aortic regurgitation, generalized anxiety disorder, hyperlipidemia, hypertension, TIA, presents ED with chief complaint of slurred speech.  Patient reports that after supper today she had sudden onset of fatigue.  She then started having slurred speech.  Apparently the timeline was different from the ED provider.  Patient reports that at first it would come and go but it is gotten much worse.  She reports that she still has slurred speech and some stuttering.  She did break out in a cold sweat as well.  She has had body aches.  She did have a mild headache in the right parietal head.  She denies any change in vision or change in hearing.  She reports 70 symptoms of been intermittent for 5 years.  Patient denies any asymmetric extremity sensation or weakness.  She has no other complaints at this time.  Patient does not smoke, does not drink, is not vaccinated for COVID.  Patient is full code.  In the ED Temp 98.4, heart rate 49-50, respiratory rate 11-14, blood pressure 197/64 No leukocytosis, hemoglobin 10.8, platelets 331 Chemistry panel is unremarkable Troponin is downtrending 24, 17 CT head shows chronic atrophic changes without acute abnormality Chest x-ray shows COPD without acute or active pulmonary disease EKG shows a heart rate of 47, sinus bradycardia, QTC 426 UA pending Caregiver had apparently mentioned to the ED doctor that patient may have overdosed on one of her blood pressure medication so she was given a good dose of glucagon.  UDS still pending. Admission  requested for TIA/CVA work-up    Review of systems:    In addition to the HPI above,  No Fever-chills, No Headache, No changes with Vision or hearing, No problems swallowing food or Liquids, No Chest pain, Cough or Shortness of Breath, No Abdominal pain, No Nausea or Vomiting, bowel movements are regular, No Blood in stool or Urine, No dysuria, No new skin rashes or bruises, No new joints pains-aches,  No recent weight gain or loss, No polyuria, polydypsia or polyphagia, No significant Mental Stressors.  All other systems reviewed and are negative.    Past History of the following :    Past Medical History:  Diagnosis Date   Acoustic neuroma ([place])    Aortic regurgitation    a. Seen on 2D echo in 2013 (moderate) but not mentioned in 11/2013 echo.   Aortic stenosis    a. 2D Echo 11/2013: mild AS.   Carotid stenosis    a. Duplex 1-39% BICA in 11/2013.   Dyspnea    Generalized anxiety disorder    Headache    a. Possible tension HA versus migraine variant.   Hyperlipidemia  Hypertension    a. Labile BP.   Hyponatremia    TIA (transient ischemic attack)       Past Surgical History:  Procedure Laterality Date   CATARACT EXTRACTION     CESAREAN SECTION  1973   CHOLECYSTECTOMY     ROBOTIC ASSISTED SALPINGO OOPHERECTOMY Bilateral 05/26/2021   Procedure: XI ROBOTIC ASSISTED SALPINGO OOPHORECTOMY;  Surgeon: Lafonda Mosses, MD;  Location: [facility];  Service: Gynecology;  Laterality: Bilateral;   WRIST SURGERY  2007   Right      Social History:      Social History   Tobacco Use   Smoking status: Never   Smokeless tobacco: Never   Tobacco comments:    Tobacco use-no  Substance Use Topics   Alcohol use: No    Alcohol/week: 0.0 standard drinks       Family History :     Family History  Problem Relation Age of Onset   Stroke Other    Heart failure Other        CHF      Home Medications:   Prior to Admission medications   Medication Sig Start Date  End Date Taking? Authorizing Provider  acetaminophen (TYLENOL) 325 MG tablet Take 650 mg by mouth every 6 (six) hours as needed for moderate pain. Patient not taking: Reported on 06/16/2021    [provider]  aspirin EC 81 MG tablet Take 81 mg by mouth daily.    [provider]  ezetimibe (ZETIA) 10 MG tablet Take 1 tablet (10 mg total) by mouth daily. Patient taking differently: Take 10 mg by mouth 3 (three) times a week. 09/26/19   Verta Ellen., NP  Multiple Vitamin (MULTIVITAMIN) capsule Take 1 capsule by mouth daily.    [provider]  nitroGLYCERIN (NITROSTAT) 0.4 MG SL tablet Place 1 tablet (0.4 mg total) under the tongue every 5 (five) minutes as needed for chest pain. Patient not taking: Reported on 03/21/2021 11/04/15   Herminio Commons, MD  olmesartan (BENICAR) 20 MG tablet Take 20 mg by mouth daily.    [provider]  olmesartan (BENICAR) 5 MG tablet Take 5 mg by mouth daily as needed (blood pressure over 150). 12/30/19   [provider]  polyvinyl alcohol (LIQUIFILM TEARS) 1.4 % ophthalmic solution Place 1 drop into both eyes as needed for dry eyes.    [provider]  RABEprazole (ACIPHEX) 20 MG tablet Take 20 mg by mouth daily.    [provider]  senna-docusate (SENOKOT-S) 8.6-50 MG tablet Take 2 tablets by mouth at bedtime. For AFTER surgery, do not take if having diarrhea Patient not taking: Reported on 06/16/2021 05/02/21   Joylene John D, NP  traMADol (ULTRAM) 50 MG tablet Take 1 tablet (50 mg total) by mouth every 12 (twelve) hours as needed for severe pain. For AFTER surgery, do not take and drive Patient not taking: Reported on 06/16/2021 05/02/21   Joylene John D, NP     Allergies:    No Known Allergies   Physical Exam:   Vitals  Blood pressure (!) 211/74, pulse (!) 43, temperature (!) 97.4 F (36.3 C), temperature source Oral, resp. rate 16, height 5' (1.524 m), weight 40 kg, SpO2 98  %.   1.  General: Patient lying supine in bed,  no acute distress   2. Psychiatric: Alert and oriented x 3, mood and behavior normal for situation, pleasant and cooperative with exam   3. Neurologic: Significant Sater,  face is symmetric, moves all 4 extremities voluntarily, at baseline without acute deficits on limited exam   4. HEENMT:  Very hard of hearing head is atraumatic, normocephalic, pupils reactive to light, neck is supple, trachea is midline, mucous membranes are moist   5. Respiratory : Lungs are clear to auscultation bilaterally without wheezing, rhonchi, rales, no cyanosis, no increase in work of breathing or accessory muscle use   6. Cardiovascular : Heart rate normal, rhythm is regular, systolic heart murmur, but no rubs or gallops, no peripheral edema, peripheral pulses palpated   7. Gastrointestinal:  Abdomen is soft, nondistended, nontender to palpation bowel sounds active, no masses or organomegaly palpated   8. Skin:  Skin is warm, dry and intact without rashes, acute lesions, or ulcers on limited exam   9.Musculoskeletal:  No acute deformities or trauma, no asymmetry in tone, no peripheral edema, peripheral pulses palpated, no tenderness to palpation in the extremities     Data Review:    CBC Recent Labs  Lab 06/17/21 0033 06/17/21 0040  WBC 6.5  --   HGB 10.8* 10.2*  HCT 32.5* 30.0*  PLT 331  --   MCV 90.0  --   MCH 29.9  --   MCHC 33.2  --   RDW 13.4  --   LYMPHSABS 1.3  --   MONOABS 0.4  --   EOSABS 0.5  --   BASOSABS 0.1  --    ------------------------------------------------------------------------------------------------------------------  Results for orders placed or performed during the hospital encounter of 06/16/21 (from the past 48 hour(s))  CBG monitoring, ED     Status: Abnormal   Collection Time: 06/16/21 11:50 PM  Result Value Ref Range   Glucose-Capillary 161 (H) 70 - 99 mg/dL    Comment: Glucose reference range applies  only to samples taken after fasting for at least 8 hours.  Resp Panel by RT-PCR (Flu A&B, Covid) Nasopharyngeal Swab     Status: None   Collection Time: 06/17/21 12:21 AM   Specimen: Nasopharyngeal Swab; Nasopharyngeal(NP) swabs in vial transport medium  Result Value Ref Range   SARS Coronavirus 2 by RT PCR NEGATIVE NEGATIVE    Comment: (NOTE) SARS-CoV-2 target nucleic acids are NOT DETECTED.  The SARS-CoV-2 RNA is generally detectable in upper respiratory specimens during the acute phase of infection. The lowest concentration of SARS-CoV-2 viral copies this assay can detect is 138 copies/mL. A negative result does not preclude SARS-Cov-2 infection and should not be used as the sole basis for treatment or other patient management decisions. A negative result may occur with  improper specimen collection/handling, submission of specimen other than nasopharyngeal swab, presence of viral mutation(s) within the areas targeted by this assay, and inadequate number of viral copies(<138 copies/mL). A negative result must be combined with clinical observations, patient history, and epidemiological information. The expected result is Negative.  Fact Sheet for Patients:  EntrepreneurPulse.com.au  Fact Sheet for Healthcare Providers:  IncredibleEmployment.be  This test is no t yet approved or cleared by the [place] FDA and  has been authorized for detection and/or diagnosis of SARS-CoV-2 by FDA under an Emergency Use Authorization (EUA). This EUA will remain  in effect (meaning this test can be used) for the duration of the COVID-19 declaration under Section 564(b)(1) of the Act, 21 U.S.C.section 360bbb-3(b)(1), unless the authorization is terminated  or revoked sooner.       Influenza A by PCR NEGATIVE NEGATIVE   Influenza B by PCR NEGATIVE NEGATIVE    Comment: (  NOTE) The Xpert Xpress SARS-CoV-2/FLU/RSV plus assay is intended as an aid in the  diagnosis of influenza from Nasopharyngeal swab specimens and should not be used as a sole basis for treatment. Nasal washings and aspirates are unacceptable for Xpert Xpress SARS-CoV-2/FLU/RSV testing.  Fact Sheet for Patients: EntrepreneurPulse.com.au  Fact Sheet for Healthcare Providers: IncredibleEmployment.be  This test is not yet approved or cleared by the [place] FDA and has been authorized for detection and/or diagnosis of SARS-CoV-2 by FDA under an Emergency Use Authorization (EUA). This EUA will remain in effect (meaning this test can be used) for the duration of the COVID-19 declaration under Section 564(b)(1) of the Act, 21 U.S.C. section 360bbb-3(b)(1), unless the authorization is terminated or revoked.  Performed at [facility], [address] [place], [place] [address]   CBC with Differential/Platelet     Status: Abnormal   Collection Time: 06/17/21 12:33 AM  Result Value Ref Range   WBC 6.5 4.0 - 10.5 K/uL   RBC 3.61 (L) 3.87 - 5.11 MIL/uL   Hemoglobin 10.8 (L) 12.0 - 15.0 g/dL   HCT 32.5 (L) 36.0 - 46.0 %   MCV 90.0 80.0 - 100.0 fL   MCH 29.9 26.0 - 34.0 pg   MCHC 33.2 30.0 - 36.0 g/dL   RDW 13.4 11.5 - 15.5 %   Platelets 331 150 - 400 K/uL   nRBC 0.0 0.0 - 0.2 %   Neutrophils Relative % 65 %   Neutro Abs 4.3 1.7 - 7.7 K/uL   Lymphocytes Relative 21 %   Lymphs Abs 1.3 0.7 - 4.0 K/uL   Monocytes Relative 6 %   Monocytes Absolute 0.4 0.1 - 1.0 K/uL   Eosinophils Relative 7 %   Eosinophils Absolute 0.5 0.0 - 0.5 K/uL   Basophils Relative 1 %   Basophils Absolute 0.1 0.0 - 0.1 K/uL   Immature Granulocytes 0 %   Abs Immature Granulocytes 0.02 0.00 - 0.07 K/uL    Comment: Performed at [facility], [address] [place], [place] [address]  Comprehensive metabolic panel     Status: Abnormal   Collection Time: 06/17/21 12:33 AM  Result Value Ref Range   Sodium 131 (L) 135 - 145 mmol/L   Potassium 3.4 (L) 3.5 - 5.1  mmol/L   Chloride 97 (L) 98 - 111 mmol/L   CO2 26 22 - 32 mmol/L   Glucose, Bld 165 (H) 70 - 99 mg/dL    Comment: Glucose reference range applies only to samples taken after fasting for at least 8 hours.   BUN 15 8 - 23 mg/dL   Creatinine, Ser 0.81 0.44 - 1.00 mg/dL   Calcium 8.9 8.9 - 10.3 mg/dL   Total Protein 6.8 6.5 - 8.1 g/dL   Albumin 3.9 3.5 - 5.0 g/dL   AST 16 15 - 41 U/L   ALT 10 0 - 44 U/L   Alkaline Phosphatase 68 38 - 126 U/L   Total Bilirubin 0.5 0.3 - 1.2 mg/dL   GFR, Estimated >60 >60 mL/min    Comment: (NOTE) Calculated using the CKD-EPI Creatinine Equation (2021)    Anion gap 8 5 - 15    Comment: Performed at [facility], [address] [place], [place] [address]  Troponin I (High Sensitivity)     Status: Abnormal   Collection Time: 06/17/21 12:33 AM  Result Value Ref Range   Troponin I (High Sensitivity) 24 (H) <18 ng/L    Comment: (NOTE) Elevated high sensitivity troponin I (hsTnI)  values and significant  changes across serial measurements may suggest ACS but many other  chronic and acute conditions are known to elevate hsTnI results.  Refer to the "Links" section for chest pain algorithms and additional  guidance. Performed at [facility], [address] [place], [place] [address]   Ethanol     Status: None   Collection Time: 06/17/21 12:33 AM  Result Value Ref Range   Alcohol, Ethyl (B) <10 <10 mg/dL    Comment: (NOTE) Lowest detectable limit for serum alcohol is 10 mg/dL.  For medical purposes only. Performed at [facility], [address] [place], [place] [address]   Protime-INR     Status: None   Collection Time: 06/17/21 12:33 AM  Result Value Ref Range   Prothrombin Time 14.0 11.4 - 15.2 seconds   INR 1.1 0.8 - 1.2    Comment: (NOTE) INR goal varies based on device and disease states. Performed at [facility], [address], [place], [place] [address]   APTT     Status: None   Collection Time: 06/17/21 12:33 AM  Result Value Ref Range    aPTT 27 24 - 36 seconds    Comment: Performed at [facility], [address], [place], [place] [address]  I-stat chem 8, ED     Status: Abnormal   Collection Time: 06/17/21 12:40 AM  Result Value Ref Range   Sodium 132 (L) 135 - 145 mmol/L   Potassium 4.4 3.5 - 5.1 mmol/L   Chloride 97 (L) 98 - 111 mmol/L   BUN 17 8 - 23 mg/dL   Creatinine, Ser 0.80 0.44 - 1.00 mg/dL   Glucose, Bld 158 (H) 70 - 99 mg/dL    Comment: Glucose reference range applies only to samples taken after fasting for at least 8 hours.   Calcium, Ion 1.16 1.15 - 1.40 mmol/L   TCO2 29 22 - 32 mmol/L   Hemoglobin 10.2 (L) 12.0 - 15.0 g/dL   HCT 30.0 (L) 36.0 - 46.0 %  Troponin I (High Sensitivity)     Status: None   Collection Time: 06/17/21  3:12 AM  Result Value Ref Range   Troponin I (High Sensitivity) 17 <18 ng/L    Comment: (NOTE) Elevated high sensitivity troponin I (hsTnI) values and significant  changes across serial measurements may suggest ACS but many other  chronic and acute conditions are known to elevate hsTnI results.  Refer to the "Links" section for chest pain algorithms and additional  guidance. Performed at [facility], [address], [place], [place] [address]     Chemistries  Recent Labs  Lab 06/17/21 0033 06/17/21 0040  NA 131* 132*  K 3.4* 4.4  CL 97* 97*  CO2 26  --   GLUCOSE 165* 158*  BUN 15 17  CREATININE 0.81 0.80  CALCIUM 8.9  --   AST 16  --   ALT 10  --   ALKPHOS 68  --   BILITOT 0.5  --    ------------------------------------------------------------------------------------------------------------------  ------------------------------------------------------------------------------------------------------------------ GFR: Estimated Creatinine Clearance: 30.1 mL/min (by C-G formula based on SCr of 0.8 mg/dL). Liver Function Tests: Recent Labs  Lab 06/17/21 0033  AST 16  ALT 10  ALKPHOS 68  BILITOT 0.5  PROT 6.8  ALBUMIN 3.9   No results for input(s):  LIPASE, AMYLASE in the last 168 hours. No results for input(s): AMMONIA in the last 168 hours. Coagulation Profile: Recent Labs  Lab 06/17/21 0033  INR 1.1   Cardiac Enzymes:  No results for input(s): CKTOTAL, CKMB, CKMBINDEX, TROPONINI in the last 168 hours. BNP (last 3 results) No results for input(s): PROBNP in the last 8760 hours. HbA1C: No results for input(s): HGBA1C in the last 72 hours. CBG: Recent Labs  Lab 06/16/21 2350  GLUCAP 161*   Lipid Profile: No results for input(s): CHOL, HDL, LDLCALC, TRIG, CHOLHDL, LDLDIRECT in the last 72 hours. Thyroid Function Tests: No results for input(s): TSH, T4TOTAL, FREET4, T3FREE, THYROIDAB in the last 72 hours. Anemia Panel: No results for input(s): VITAMINB12, FOLATE, FERRITIN, TIBC, IRON, RETICCTPCT in the last 72 hours.  --------------------------------------------------------------------------------------------------------------- Urine analysis:    Component Value Date/Time   COLORURINE STRAW (A) 05/17/2021 1330   APPEARANCEUR CLEAR 05/17/2021 1330   LABSPEC 1.005 05/17/2021 1330   PHURINE 6.0 05/17/2021 1330   GLUCOSEU NEGATIVE 05/17/2021 1330   HGBUR SMALL (A) 05/17/2021 1330   BILIRUBINUR NEGATIVE 05/17/2021 1330   KETONESUR NEGATIVE 05/17/2021 1330   PROTEINUR NEGATIVE 05/17/2021 1330   UROBILINOGEN 0.2 12/11/2012 2243   NITRITE NEGATIVE 05/17/2021 1330   LEUKOCYTESUR TRACE (A) 05/17/2021 1330      Imaging Results:    CT Head Wo Contrast  Result Date: 06/17/2021 CLINICAL DATA:  Generalized weakness EXAM: CT HEAD WITHOUT CONTRAST TECHNIQUE: Contiguous axial images were obtained from the base of the skull through the vertex without intravenous contrast. COMPARISON:  10/02/2020 FINDINGS: Brain: No evidence of acute infarction, hemorrhage, hydrocephalus, extra-axial collection or mass lesion/mass effect. Chronic atrophic changes are noted. Vascular: No hyperdense vessel or unexpected calcification. Skull: Normal.  Negative for fracture or focal lesion. Sinuses/Orbits: No acute finding. Other: None. IMPRESSION: Chronic atrophic changes without acute abnormality. Electronically Signed   By: Inez Catalina M.D.   On: 06/17/2021 00:52   DG Chest Portable 1 View  Result Date: 06/17/2021 CLINICAL DATA:  Altered mental status and generalized weakness. EXAM: PORTABLE CHEST 1 VIEW COMPARISON:  March 10, 2021 FINDINGS: The lungs are mildly hyperinflated. Mild, diffuse, chronic appearing increased lung markings are noted. There is no evidence of acute infiltrate, pleural effusion or pneumothorax. The heart size and mediastinal contours are within normal limits. There is marked severity calcification of the aortic arch. The visualized skeletal structures are unremarkable. IMPRESSION: Findings consistent with COPD without acute or active cardiopulmonary disease. Electronically Signed   By: Virgina Norfolk M.D.   On: 06/17/2021 01:02      Assessment & Plan:    Principal Problem:   Slurred speech   Slurred speech Chief complaint of slurred speech/stuttering work-up CT head showed no acute changes MRI for a.m. Echo for a.m. Continue to utilize ischemic stroke order set Continue to monitor Bradycardia Holding antihypertensives that could affect heart rate Continue to monitor Hypertension Currently holding any meds that could cause bradycardia Continue to monitor Hyperlipidemia Continue Zetia   DVT Prophylaxis-   Heparin - SCDs   AM Labs Ordered, also please review Full Orders  Family Communication: No family at bedside  Code Status: Full  Admission status: Observation Disposition: Anticipated Discharge date 24-48 hours discharge to home  Time spent in minutes : [place] DO

## 2021-06-17 NOTE — Evaluation (Signed)
Physical Therapy Evaluation Patient Details Name: Kathryn Gates MRN: [id number] DOB: 1929/08/26 Today's Date: 06/17/2021  History of Present Illness  Kathryn Gates  is a 87 y.o. female, with history of acoustic neuroma, aortic regurgitation, generalized anxiety disorder, hyperlipidemia, hypertension, TIA, presents ED with chief complaint of slurred speech.  Patient reports that after supper today she had sudden onset of fatigue.  She then started having slurred speech.  Apparently the timeline was different from the ED provider.  Patient reports that at first it would come and go but it is gotten much worse.  She reports that she still has slurred speech and some stuttering.  She did break out in a cold sweat as well.  She has had body aches.  She did have a mild headache in the right parietal head.  She denies any change in vision or change in hearing.  She reports 70 symptoms of been intermittent for 5 years.  Patient denies any asymmetric extremity sensation or weakness.  She has no other complaints at this time.   Clinical Impression  Patient demonstrates slow labored movement for sitting up at bedside, c/o dizziness and mild nausea upon standing, limited to a few side steps before having to sit due to generalized weakness and fatigue.  Patient BP's as follows: seated 76/38 then dropped to 52/26 while standing, patient put back to bed - RN notified.  Patient will benefit from continued skilled physical therapy in hospital and recommended venue below to increase strength, balance, endurance for safe ADLs and gait.      Recommendations for follow up therapy are one component of a multi-disciplinary discharge planning process, led by the attending physician.  Recommendations may be updated based on patient status, additional functional criteria and insurance authorization.  Follow Up Recommendations Skilled nursing-short term rehab (<3 hours/day)    Assistance Recommended at Discharge  Intermittent Supervision/Assistance  Functional Status Assessment Patient has had a recent decline in their functional status and demonstrates the ability to make significant improvements in function in a reasonable and predictable amount of time.  Equipment Recommendations  None recommended by PT    Recommendations for Other Services       Precautions / Restrictions Precautions Precautions: Fall Restrictions Weight Bearing Restrictions: No      Mobility  Bed Mobility Overal bed mobility: Needs Assistance Bed Mobility: Supine to Sit;Sit to Supine     Supine to sit: Min assist Sit to supine: Supervision   General bed mobility comments: increased time, labored movement    Transfers Overall transfer level: Needs assistance Equipment used: None;1 person hand held assist Transfers: Sit to/from Stand;Bed to chair/wheelchair/BSC Sit to Stand: Min assist   Step pivot transfers: Min assist       General transfer comment: very unsteady without AD requiring hand held assist, had to use RW for safety    Ambulation/Gait Ambulation/Gait assistance: Mod assist Gait Distance (Feet): 5 Feet Assistive device: 1 person hand held assist;Rolling walker (2 wheels) Gait Pattern/deviations: Decreased step length - right;Decreased step length - left;Decreased stride length Gait velocity: decreased     General Gait Details: limited to a few slow labored side steps at bedside before having to sit due to c/o dizziness and nausea  Stairs            Wheelchair Mobility    Modified Rankin (Stroke Patients Only)       Balance Overall balance assessment: Needs assistance Sitting-balance support: Feet supported;No upper extremity supported Sitting balance-Leahy Scale: Good Sitting  balance - Comments: EOB   Standing balance support: During functional activity;Reliant on assistive device for balance;No upper extremity supported Standing balance-Leahy Scale: Poor Standing balance  comment: fair using RW                             Pertinent Vitals/Pain Pain Assessment: No/denies pain    Home Living Family/patient expects to be discharged to:: Private residence Living Arrangements: Other relatives Available Help at Discharge: Personal care attendant Type of Home: House Home Access: Stairs to enter Entrance Stairs-Rails: Right;Left;Can reach both Entrance Stairs-Number of Steps: 4 to 5   Home Layout: One level Home Equipment: Conservation officer, nature (2 wheels);Cane - single point;BSC/3in1;Wheelchair - manual Additional Comments: Personal care attendant comes to the house 8 hours a day to help with pt's sister who has dementia.    Prior Function Prior Level of Function : Independent/Modified Independent             Mobility Comments: [place] ambulator with cane; RW used in community ADLs Comments: Pt reported independence with ADLs and most IADL's.     Hand Dominance   Dominant Hand: Right    Extremity/Trunk Assessment   Upper Extremity Assessment Upper Extremity Assessment: Defer to OT evaluation    Lower Extremity Assessment Lower Extremity Assessment: Generalized weakness    Cervical / Trunk Assessment Cervical / Trunk Assessment: Kyphotic  Communication   Communication: Expressive difficulties  Cognition Arousal/Alertness: Awake/alert Behavior During Therapy: WFL for tasks assessed/performed Overall Cognitive Status: Within Functional Limits for tasks assessed                                   Functional Status Assessment: Patient has had a recent decline in their functional status and demonstrates the ability to make significant improvements in function in a reasonable and predictable amount of time.      General Comments      Exercises     Assessment/Plan    PT Assessment Patient needs continued PT services  PT Problem List Decreased strength;Decreased activity tolerance;Decreased balance;Decreased  mobility       PT Treatment Interventions DME instruction;Gait training;Stair training;Functional mobility training;Therapeutic activities;Therapeutic exercise;Patient/family education;Balance training    PT Goals (Current goals can be found in the Care Plan section)  Acute Rehab PT Goals Patient Stated Goal: return home able to take care of sister who has dementia PT Goal Formulation: With patient Time For Goal Achievement: 07/01/21 Potential to Achieve Goals: Good    Frequency Min 3X/week   Barriers to discharge        Co-evaluation               AM-PAC PT "6 Clicks" Mobility  Outcome Measure Help needed turning from your back to your side while in a flat bed without using bedrails?: A Little Help needed moving from lying on your back to sitting on the side of a flat bed without using bedrails?: A Little Help needed moving to and from a bed to a chair (including a wheelchair)?: A Little Help needed standing up from a chair using your arms (e.g., wheelchair or bedside chair)?: A Little Help needed to walk in hospital room?: A Lot Help needed climbing 3-5 steps with a railing? : A Lot 6 Click Score: 16    End of Session   Activity Tolerance: Patient tolerated treatment well;Patient limited by fatigue Patient left:  in bed;with call bell/phone within reach Nurse Communication: Mobility status PT Visit Diagnosis: Unsteadiness on feet (R26.81);Other abnormalities of gait and mobility (R26.89);Muscle weakness (generalized) (M62.81)    Time: 5486-2824 PT Time Calculation (min) (ACUTE ONLY): 20 min   Charges:   PT Evaluation $PT Eval Moderate Complexity: 1 Mod PT Treatments $Therapeutic Activity: 8-22 mins        4:46 PM, 06/17/21 Lonell Grandchild, MPT Physical Therapist with [facility] 336 [phone] office [phone] mobile phone

## 2021-06-17 NOTE — Progress Notes (Addendum)
Standing position bp 52/26

## 2021-06-17 NOTE — ED Provider Notes (Signed)
[facility] EMERGENCY DEPARTMENT Provider Note   CSN: [id number] Arrival date & time: 06/16/21  2343     History Chief Complaint  Patient presents with   weakness   Weakness    [place] is a 87 y.o. female.  Patient from home with difficulty speaking for the past 1 week with generalized weakness.  Family friend called EMS.  Patient states she has noticed difficulty speaking and her words not coming out like she wants them to.  Denies any headache, chest pain, shortness of breath.  Denies any weakness to her arms or legs.  Denies any abdominal pain, nausea or vomiting. Difficultly staying awake on arrival. Denies any fevers or recent illnesses.  Denies any possible ingestion.  D/w Patient's sone Ronnie.  He is out of town and is not sure when the patient's speech problem started.  He noticed that around 10 PM tonight.  He thinks it there were some yesterday as well. He recommends speaking with patient's friend Olivia Mackie [phone]. Olivia Mackie noticed patient speech problem this evening.  Her last seen normal by Olivia Mackie was on Monday. Olivia Mackie states patient may have taken too much blood pressure medication and sometimes doubles up her medications if she is worried about her blood pressure.  The history is provided by the patient.  Weakness     Past Medical History:  Diagnosis Date   Acoustic neuroma ([place])    Aortic regurgitation    a. Seen on 2D echo in 2013 (moderate) but not mentioned in 11/2013 echo.   Aortic stenosis    a. 2D Echo 11/2013: mild AS.   Carotid stenosis    a. Duplex 1-39% BICA in 11/2013.   Dyspnea    Generalized anxiety disorder    Headache    a. Possible tension HA versus migraine variant.   Hyperlipidemia    Hypertension    a. Labile BP.   Hyponatremia    TIA (transient ischemic attack)     Patient Active Problem List   Diagnosis Date Noted   Adnexal mass 05/02/2021   Loss of weight 07/01/2020   Dysphagia 07/01/2020   Loss of taste 07/01/2020    Sinus bradycardia 10/08/2014   Headache    Generalized anxiety disorder    Carotid stenosis    Hyperlipidemia    Aortic stenosis 12/05/2013   Acoustic neuroma ([place]) 12/13/2012   TIA (transient ischemic attack) 12/12/2012   Hypertension 12/12/2012   Hyperkalemia 12/12/2012   Hyponatremia 12/12/2012   Aortic stenosis, mild 01/14/2009   ABNORMAL ECHOCARDIOGRAM 01/14/2009    Past Surgical History:  Procedure Laterality Date   CATARACT EXTRACTION     [place] OOPHERECTOMY Bilateral 05/26/2021   Procedure: XI ROBOTIC ASSISTED SALPINGO OOPHORECTOMY;  Surgeon: Lafonda Mosses, MD;  Location: [facility] ORS;  Service: Gynecology;  Laterality: Bilateral;   WRIST SURGERY  2007   Right     OB History     Gravida  1   Para  1   Term      Preterm      AB      Living         SAB      IAB      Ectopic      Multiple      Live Births              Family History  Problem Relation Age of Onset   Stroke Other  Heart failure Other        CHF    Social History   Tobacco Use   Smoking status: Never   Smokeless tobacco: Never   Tobacco comments:    Tobacco use-no  Vaping Use   Vaping Use: Never used  Substance Use Topics   Alcohol use: No    Alcohol/week: 0.0 standard drinks   Drug use: No    Home Medications Prior to Admission medications   Medication Sig Start Date End Date Taking? Authorizing Provider  acetaminophen (TYLENOL) 325 MG tablet Take 650 mg by mouth every 6 (six) hours as needed for moderate pain. Patient not taking: Reported on 06/16/2021    [provider]  aspirin EC 81 MG tablet Take 81 mg by mouth daily.    [provider]  ezetimibe (ZETIA) 10 MG tablet Take 1 tablet (10 mg total) by mouth daily. Patient taking differently: Take 10 mg by mouth 3 (three) times a week. 09/26/19   Verta Ellen., NP  Multiple Vitamin (MULTIVITAMIN) capsule Take 1 capsule by  mouth daily.    [provider]  nitroGLYCERIN (NITROSTAT) 0.4 MG SL tablet Place 1 tablet (0.4 mg total) under the tongue every 5 (five) minutes as needed for chest pain. Patient not taking: Reported on 03/21/2021 11/04/15   Herminio Commons, MD  olmesartan (BENICAR) 20 MG tablet Take 20 mg by mouth daily.    [provider]  olmesartan (BENICAR) 5 MG tablet Take 5 mg by mouth daily as needed (blood pressure over 150). 12/30/19   [provider]  polyvinyl alcohol (LIQUIFILM TEARS) 1.4 % ophthalmic solution Place 1 drop into both eyes as needed for dry eyes.    [provider]  RABEprazole (ACIPHEX) 20 MG tablet Take 20 mg by mouth daily.    [provider]  senna-docusate (SENOKOT-S) 8.6-50 MG tablet Take 2 tablets by mouth at bedtime. For AFTER surgery, do not take if having diarrhea Patient not taking: Reported on 06/16/2021 05/02/21   Joylene John D, NP  traMADol (ULTRAM) 50 MG tablet Take 1 tablet (50 mg total) by mouth every 12 (twelve) hours as needed for severe pain. For AFTER surgery, do not take and drive Patient not taking: Reported on 06/16/2021 05/02/21   Joylene John D, NP    Allergies    Patient has no known allergies.  Review of Systems   Review of Systems  Unable to perform ROS: Mental status change  Neurological:  Positive for weakness.   Physical Exam Updated Vital Signs BP (!) 159/69 (BP Location: Left Arm)    Pulse (!) 50    Temp 98.4 F (36.9 C) (Axillary)    Resp 12    SpO2 95%   Physical Exam Vitals and nursing note reviewed.  Constitutional:      General: She is not in acute distress.    Appearance: She is well-developed.     Comments: Sleepy but arousable and answers questions appropriately.  Falling asleep during questioning. Slurred speech.  HENT:     Head: Normocephalic and atraumatic.     Mouth/Throat:     Pharynx: No oropharyngeal exudate.  Eyes:     Conjunctiva/sclera: Conjunctivae normal.      Pupils: Pupils are equal, round, and reactive to light.  Neck:     Comments: No meningismus. Cardiovascular:     Rate and Rhythm: Regular rhythm. Bradycardia present.     Heart sounds: Murmur heard.  Pulmonary:  Effort: Pulmonary effort is normal. No respiratory distress.     Breath sounds: Normal breath sounds.  Abdominal:     Palpations: Abdomen is soft.     Tenderness: There is no abdominal tenderness. There is no guarding or rebound.  Musculoskeletal:        General: No tenderness. Normal range of motion.     Cervical back: Normal range of motion and neck supple.  Skin:    General: Skin is warm.  Neurological:     Mental Status: She is alert and oriented to person, place, and time.     Cranial Nerves: No cranial nerve deficit.     Motor: No abnormal muscle tone.     Coordination: Coordination normal.     Comments: Slurred speech, difficulty staying awake and answering questions.  No appreciable facial droop. 5/5 strength throughout.  Able to lift arms and legs off the bed bilaterally  Psychiatric:        Behavior: Behavior normal.    ED Results / Procedures / Treatments   Labs (all labs ordered are listed, but only abnormal results are displayed) Labs Reviewed  CBC WITH DIFFERENTIAL/PLATELET - Abnormal; Notable for the following components:      Result Value   RBC 3.61 (*)    Hemoglobin 10.8 (*)    HCT 32.5 (*)    All other components within normal limits  COMPREHENSIVE METABOLIC PANEL - Abnormal; Notable for the following components:   Sodium 131 (*)    Potassium 3.4 (*)    Chloride 97 (*)    Glucose, Bld 165 (*)    All other components within normal limits  CBG MONITORING, ED - Abnormal; Notable for the following components:   Glucose-Capillary 161 (*)    All other components within normal limits  I-STAT CHEM 8, ED - Abnormal; Notable for the following components:   Sodium 132 (*)    Chloride 97 (*)    Glucose, Bld 158 (*)    Hemoglobin 10.2 (*)    HCT  30.0 (*)    All other components within normal limits  TROPONIN I (HIGH SENSITIVITY) - Abnormal; Notable for the following components:   Troponin I (High Sensitivity) 24 (*)    All other components within normal limits  RESP PANEL BY RT-PCR (FLU A&B, COVID) ARPGX2  ETHANOL  PROTIME-INR  APTT  URINALYSIS, ROUTINE W REFLEX MICROSCOPIC  RAPID URINE DRUG SCREEN, HOSP PERFORMED  TROPONIN I (HIGH SENSITIVITY)    EKG EKG Interpretation  Date/Time:  Thursday June 16 2021 23:58:22 EST Ventricular Rate:  47 PR Interval:  171 QRS Duration: 98 QT Interval:  481 QTC Calculation: 426 R Axis:   80 Text Interpretation: Sinus bradycardia Probable left ventricular hypertrophy Minimal ST elevation, inferior leads Baseline wander in lead(s) II No significant change was found Confirmed by Ezequiel Essex [phone]) on 06/17/2021 12:19:42 AM  Radiology CT Head Wo Contrast  Result Date: 06/17/2021 CLINICAL DATA:  Generalized weakness EXAM: CT HEAD WITHOUT CONTRAST TECHNIQUE: Contiguous axial images were obtained from the base of the skull through the vertex without intravenous contrast. COMPARISON:  10/02/2020 FINDINGS: Brain: No evidence of acute infarction, hemorrhage, hydrocephalus, extra-axial collection or mass lesion/mass effect. Chronic atrophic changes are noted. Vascular: No hyperdense vessel or unexpected calcification. Skull: Normal. Negative for fracture or focal lesion. Sinuses/Orbits: No acute finding. Other: None. IMPRESSION: Chronic atrophic changes without acute abnormality. Electronically Signed   By: Inez Catalina M.D.   On: 06/17/2021 00:52   DG Chest Portable 1  View  Result Date: 06/17/2021 CLINICAL DATA:  Altered mental status and generalized weakness. EXAM: PORTABLE CHEST 1 VIEW COMPARISON:  March 10, 2021 FINDINGS: The lungs are mildly hyperinflated. Mild, diffuse, chronic appearing increased lung markings are noted. There is no evidence of acute infiltrate, pleural effusion  or pneumothorax. The heart size and mediastinal contours are within normal limits. There is marked severity calcification of the aortic arch. The visualized skeletal structures are unremarkable. IMPRESSION: Findings consistent with COPD without acute or active cardiopulmonary disease. Electronically Signed   By: Virgina Norfolk M.D.   On: 06/17/2021 01:02    Procedures Procedures   Medications Ordered in ED Medications - No data to display  ED Course  I have reviewed the triage vital signs and the nursing notes.  Pertinent labs & imaging results that were available during my care of the patient were reviewed by me and considered in my medical decision making (see chart for details).    MDM Rules/Calculators/A&P                         Slurred speech for the past 1 week. No other neurological deficits.   Code stroke not activated due to delay in presentation  CT head is normal.  Patient with some bradycardia into the 40s and 50s.  She is asymptomatic from this.  EKG is sinus.  She is not on a beta-blocker or calcium channel blocker.  She does have aortic stenosis but no previous bradycardia like this as far as we can tell.  Labs appear to be at baseline.  Patient asymptomatic from her bradycardia.  Concern for possible underlying CVA given her slurred speech.  She is not a candidate for any intervention or tPA given delayed presentation.  Lites are normal more than 24 hours ago.  Admission discussed with Dr. Clearence Ped.    Final Clinical Impression(s) / ED Diagnoses Final diagnoses:  Slurred speech  Bradycardia    Rx / DC Orders ED Discharge Orders     None        Hadessah Grennan, Annie Main, MD 06/17/21 [phone]

## 2021-06-17 NOTE — Telephone Encounter (Signed)
Received a call from a family member of pt requesting to reschedule appt for today. Pt is currently admitted. Appt rescheduled for 07/01/2021 at 3:30 pm. Family member is agreeable to new appt date and time.

## 2021-06-17 NOTE — Progress Notes (Signed)
Patient seen and examined.  Admitted after midnight secondary to sudden development of generalized weakness and slurred speech.  Currently hemodynamically stable but is still demonstrating difficulty finding words, complaining of feeling weak and having trouble some with articulation.  Please refer to H&P written by Dr. Clearence Ped for further info/details on admission.  Plan: -Follow recommendation by speech therapy, PT/OT -Complete TIA/stroke work-up -Permissive hypertension -Continue aspirin -Follow clinical improvements and if needed will get neurology consultation.  [name] MD [phone]

## 2021-06-17 NOTE — Plan of Care (Signed)
°  Problem: Acute Rehab OT Goals (only OT should resolve) Goal: Pt. Will Perform Grooming Flowsheets (Taken 06/17/2021 1226) Pt Will Perform Grooming:  with modified independence  standing Goal: Pt. Will Perform Lower Body Dressing Flowsheets (Taken 06/17/2021 1226) Pt Will Perform Lower Body Dressing:  with modified independence  sitting/lateral leans  sit to/from stand Goal: Pt. Will Transfer To Toilet Flowsheets (Taken 06/17/2021 1226) Pt Will Transfer to Toilet:  with modified independence  ambulating Goal: Pt/Caregiver Will Perform Home Exercise Program Flowsheets (Taken 06/17/2021 1226) Pt/caregiver will Perform Home Exercise Program:  Increased strength  Both right and left upper extremity  Independently  Che Below OT, MOT

## 2021-06-17 NOTE — Evaluation (Signed)
Occupational Therapy Evaluation Patient Details Name: Kathryn Gates MRN: [id number] DOB: Nov 16, 1929 Today's Date: 06/17/2021   History of Present Illness Kathryn Gates  is a 87 y.o. female, with history of acoustic neuroma, aortic regurgitation, generalized anxiety disorder, hyperlipidemia, hypertension, TIA, presents ED with chief complaint of slurred speech.  Patient reports that after supper today she had sudden onset of fatigue.  She then started having slurred speech.  Apparently the timeline was different from the ED provider.  Patient reports that at first it would come and go but it is gotten much worse.  She reports that she still has slurred speech and some stuttering.  She did break out in a cold sweat as well.  She has had body aches.  She did have a mild headache in the right parietal head.  She denies any change in vision or change in hearing.  She reports 70 symptoms of been intermittent for 5 years.  Patient denies any asymmetric extremity sensation or weakness.  She has no other complaints at this time.   Clinical Impression   Pt agreeable to OT evaluation. Pt lethargic at start of session but increased arousal once seated at EOB. Pt was able to don socks without physical assist and completed transfers to [facility] and chair using RW with min G to min A mostly to assist with navigation of RW in room. Pt was able to complete peri-care standing at [facility] with RW. Pt demosntrates general B UE weakness. Pt reports that she takes care of her sister with dementia but that she needs more support at home. Pt left in bed with call bell within reach. Pt will benefit from continued OT in the hospital and recommended venue below to increase strength, balance, and endurance for safe ADL's.        Recommendations for follow up therapy are one component of a multi-disciplinary discharge planning process, led by the attending physician.  Recommendations may be updated based on patient status, additional  functional criteria and insurance authorization.   Follow Up Recommendations  Home health OT    Assistance Recommended at Discharge Intermittent Supervision/Assistance  Functional Status Assessment  Patient has had a recent decline in their functional status and demonstrates the ability to make significant improvements in function in a reasonable and predictable amount of time.  Equipment Recommendations  None recommended by OT    Recommendations for Other Services       Precautions / Restrictions Precautions Precautions: Fall Restrictions Weight Bearing Restrictions: No      Mobility Bed Mobility Overal bed mobility: Needs Assistance Bed Mobility: Supine to Sit;Sit to Supine     Supine to sit: Supervision;HOB elevated Sit to supine: Supervision   General bed mobility comments: Mild labored movement.    Transfers Overall transfer level: Needs assistance Equipment used: Rolling walker (2 wheels) Transfers: Sit to/from Stand;Bed to chair/wheelchair/BSC Sit to Stand: Min guard;Min assist     Step pivot transfers: Min guard;Min assist     General transfer comment: Pt using RW EOB to BSC and BSC to chair. Pt assisted in use of RW due to far anterior placement of walker and mild assist to guide to chair. No physical assist needed for boost to standing.      Balance Overall balance assessment: Needs assistance Sitting-balance support: No upper extremity supported;Feet supported Sitting balance-Leahy Scale: Good Sitting balance - Comments: EOB   Standing balance support: Bilateral upper extremity supported;During functional activity;Reliant on assistive device for balance Standing balance-Leahy Scale: [place] Standing  balance comment: using RW                           ADL either performed or assessed with clinical judgement   ADL Overall ADL's : Needs assistance/impaired                     Lower Body Dressing: Supervision/safety;Sitting/lateral  leans Lower Body Dressing Details (indicate cue type and reason): Pt donned socks seated at EOB. Toilet Transfer: Min guard;Minimal assistance;Stand-pivot;Rolling walker (2 wheels);BSC/3in1 Toilet Transfer Details (indicate cue type and reason): EOB to BSC and back with Min G to min A. Toileting- Clothing Manipulation and Hygiene: Supervision/safety;Sit to/from stand Toileting - Clothing Manipulation Details (indicate cue type and reason): Pt completed peri-care standing at [facility] with RW.     Functional mobility during ADLs: Min guard;Minimal assistance;Rolling walker (2 wheels) General ADL Comments: Min A to use RW. Pt often placing RW far anterior.     Vision Baseline Vision/History: 1 Wears glasses Ability to See in Adequate Light: 1 Impaired Patient Visual Report: No change from baseline Vision Assessment?: No apparent visual deficits                Pertinent Vitals/Pain Pain Assessment: No/denies pain     Hand Dominance Right   Extremity/Trunk Assessment Upper Extremity Assessment Upper Extremity Assessment: Generalized weakness   Lower Extremity Assessment Lower Extremity Assessment: Defer to PT evaluation   Cervical / Trunk Assessment Cervical / Trunk Assessment: Kyphotic   Communication Communication Communication: Expressive difficulties   Cognition Arousal/Alertness: Awake/alert Behavior During Therapy: WFL for tasks assessed/performed Overall Cognitive Status: Within Functional Limits for tasks assessed                                                        Home Living Family/patient expects to be discharged to:: Private residence Living Arrangements: Other relatives (sister) Available Help at Discharge: Personal care attendant (8 hours a day) Type of Home: House Home Access: Stairs to enter [employer] of Steps: 4 to 5 Entrance Stairs-Rails: Right;Left;Can reach both Home Layout: One level     Bathroom Shower/Tub:  Occupational psychologist: Handicapped height     Home Equipment: Conservation officer, nature (2 wheels);Cane - single point;BSC/3in1;Wheelchair - manual (Reports sister uses w/c.)   Additional Comments: Personal care attendant comes to the house 8 hours a day to help with pt's sister who has dementia.      Prior Functioning/Environment Prior Level of Function : Independent/Modified Independent             Mobility Comments: [place] ambulator with cane; RW used in community ADLs Comments: Pt reported independence with ADLs and most IADL's.        OT Problem List: Decreased strength;Decreased activity tolerance;Impaired balance (sitting and/or standing)      OT Treatment/Interventions: Self-care/ADL training;Therapeutic exercise;Therapeutic activities;Neuromuscular education;Patient/family education;Balance training    OT Goals(Current goals can be found in the care plan section) Acute Rehab OT Goals Patient Stated Goal: To get stronger. OT Goal Formulation: With patient Time For Goal Achievement: 07/01/21 Potential to Achieve Goals: Good  OT Frequency: Min 2X/week    AM-PAC OT "6 Clicks" Daily Activity     Outcome Measure Help from another person eating meals?: None Help from another  person taking care of personal grooming?: A Little Help from another person toileting, which includes using toliet, bedpan, or urinal?: A Little Help from another person bathing (including washing, rinsing, drying)?: A Little Help from another person to put on and taking off regular upper body clothing?: None Help from another person to put on and taking off regular lower body clothing?: A Little 6 Click Score: 20   End of Session Equipment Utilized During Treatment: Rolling walker (2 wheels) Nurse Communication: Mobility status  Activity Tolerance: Patient tolerated treatment well Patient left: in bed;with call bell/phone within reach  OT Visit Diagnosis: Unsteadiness on feet  (R26.81);Other abnormalities of gait and mobility (R26.89);Muscle weakness (generalized) (M62.81)                Time: 9562-1308 OT Time Calculation (min): 38 min Charges:  OT General Charges $OT Visit: 1 Visit OT Evaluation $OT Eval Low Complexity: 1 Low OT Treatments $Self Care/Home Management : 8-22 mins  Phuong Moffatt OT, MOT  Larey Seat 06/17/2021, 12:24 PM

## 2021-06-17 NOTE — Evaluation (Signed)
Clinical/Bedside Swallow Evaluation Patient Details  Name: Kathryn Gates MRN: [id number] Date of Birth: March 06, 1930  Today's Date: 06/17/2021 Time: SLP Start Time (ACUTE ONLY): 1301 SLP Stop Time (ACUTE ONLY): 1330 SLP Time Calculation (min) (ACUTE ONLY): 29 min  Past Medical History:  Past Medical History:  Diagnosis Date   Acoustic neuroma ([place])    Aortic regurgitation    a. Seen on 2D echo in 2013 (moderate) but not mentioned in 11/2013 echo.   Aortic stenosis    a. 2D Echo 11/2013: mild AS.   Carotid stenosis    a. Duplex 1-39% BICA in 11/2013.   Dyspnea    Generalized anxiety disorder    Headache    a. Possible tension HA versus migraine variant.   Hyperlipidemia    Hypertension    a. Labile BP.   Hyponatremia    TIA (transient ischemic attack)    Past Surgical History:  Past Surgical History:  Procedure Laterality Date   CATARACT EXTRACTION     CESAREAN SECTION  1973   CHOLECYSTECTOMY     ROBOTIC ASSISTED SALPINGO OOPHERECTOMY Bilateral 05/26/2021   Procedure: XI ROBOTIC ASSISTED SALPINGO OOPHORECTOMY;  Surgeon: Lafonda Mosses, MD;  Location: [facility];  Service: Gynecology;  Laterality: Bilateral;   WRIST SURGERY  2007   Right   HPI:  87 y.o. female, with history of acoustic neuroma, aortic regurgitation, generalized anxiety disorder, hyperlipidemia, hypertension, TIA, presents ED with chief complaint of slurred speech.  Patient reports that after supper today she had sudden onset of fatigue.  She then started having slurred speech.  Apparently the timeline was different from the ED provider.  Patient reports that at first it would come and go but it is gotten much worse.  She reports that she still has slurred speech and some stuttering.  She did break out in a cold sweat as well.  She has had body aches.  She did have a mild headache in the right parietal head.  She denies any change in vision or change in hearing.  She reports 70 symptoms of been intermittent for 5  years.  Patient denies any asymmetric extremity sensation or weakness.  She has no other complaints at this time; MRI head 06/17/21 indicated Examination significantly limited by motion artifact, as  described.  2. No intracranial proximal large vessel occlusion is identified.  3. Intracranial atherosclerotic disease, as described and progressed  from the MRA head of 12/12/2012. Some of the described intracranial  stenoses may be exaggerated by motion artifact on the current exam; BSE/SLE generated.    Assessment / Plan / Recommendation  Clinical Impression  Pt seen for clinical swallowing evaluation d/t c/o dysphagia and "gasping" during intake when swallow screen was completed.  Oral care provided and xerostomia present with dry, cracked tongue noted prior to intake.  Pt stated she has periods where her "mouth will go dry" and she will have difficulty swallowing various textures.  Pt consumed thin via cup sips to wet oral mucosa prior to solids.  Refused puree and masticated solids with decreased oral manipulation d/t xerostomia, but was able to fully prepare bolus and had a timely swallow during all consumption without overt s/s of aspiration.  Discussed strategies to maximize preparation of oral mucosa prior to consuming meals/snacks.  Pt's speech had improved from initial dysarthria observed when admitted.  OME normal.  Recommend Regular/thin liquid diet with general swallowing precautions in place.  Pt prefers room temperature liquids without ice d/t hypersensitivity.  ST will  f/u for completion of speech/language evaluation and diet tolerance. SLP Visit Diagnosis: Dysphagia, unspecified (R13.10)    Aspiration Risk  Mild aspiration risk    Diet Recommendation   Regular/thin liquids  Medication Administration: Whole meds with liquid (or puree if beneficial if xerostomia persists)    Other  Recommendations Oral Care Recommendations: Oral care BID    Recommendations for follow up therapy are one  component of a multi-disciplinary discharge planning process, led by the attending physician.  Recommendations may be updated based on patient status, additional functional criteria and insurance authorization.  Follow up Recommendations Other (comment) (TBD)      Assistance Recommended at Discharge Intermittent Supervision/Assistance  Functional Status Assessment Patient has had a recent decline in their functional status and demonstrates the ability to make significant improvements in function in a reasonable and predictable amount of time.  Frequency and Duration min 1 x/week  1 week       Prognosis Prognosis for Safe Diet Advancement: Good      Swallow Study   General Date of Onset: 06/16/21 HPI: 87 y.o. female, with history of acoustic neuroma, aortic regurgitation, generalized anxiety disorder, hyperlipidemia, hypertension, TIA, presents ED with chief complaint of slurred speech.  Patient reports that after supper today she had sudden onset of fatigue.  She then started having slurred speech.  Apparently the timeline was different from the ED provider.  Patient reports that at first it would come and go but it is gotten much worse.  She reports that she still has slurred speech and some stuttering.  She did break out in a cold sweat as well.  She has had body aches.  She did have a mild headache in the right parietal head.  She denies any change in vision or change in hearing.  She reports 70 symptoms of been intermittent for 5 years.  Patient denies any asymmetric extremity sensation or weakness.  She has no other complaints at this time; MRI head 06/17/21 indicated Examination significantly limited by motion artifact, as  described.  2. No intracranial proximal large vessel occlusion is identified.  3. Intracranial atherosclerotic disease, as described and progressed  from the MRA head of 12/12/2012. Some of the described intracranial  stenoses may be exaggerated by motion artifact on the  current exam; BSE/SLE generated. Type of Study: Bedside Swallow Evaluation Previous Swallow Assessment: [place] failed on 06/17/21 d/t gasping after intake Diet Prior to this Study: NPO Temperature Spikes Noted: No Respiratory Status: Room air History of Recent Intubation: No Behavior/Cognition: Alert;Cooperative;Distractible Oral Cavity Assessment: Dry Oral Care Completed by SLP: Yes Oral Cavity - Dentition: Adequate natural dentition;Missing dentition Vision: Functional for self-feeding Self-Feeding Abilities: Able to feed self;Needs assist Patient Positioning: Upright in bed Baseline Vocal Quality: Hoarse Volitional Cough: Strong Volitional Swallow: Unable to elicit (d/t xerostomia being significant initially, but able to complete after oral care)    Oral/Motor/Sensory Function Overall Oral Motor/Sensory Function: Within functional limits   Ice Chips Ice chips: Not tested   Thin Liquid Thin Liquid: Within functional limits Presentation: Cup Other Comments: refused straw    Nectar Thick Nectar Thick Liquid: Not tested   Honey Thick Honey Thick Liquid: Not tested   Puree Puree: Not tested Other Comments: Pt refusal   Solid     Solid: Impaired Presentation: Self Fed Oral Phase Impairments: Reduced lingual movement/coordination;Other (comment) (d/t xerostomia) Oral Phase Functional Implications: Prolonged oral transit;Impaired mastication      Pat Devynn Hessler,M.S., CCC-SLP 06/17/2021,2:27 PM

## 2021-06-17 NOTE — TOC Progression Note (Signed)
Transition of Care [facility]) - Progression Note    Patient Details  Name: Kathryn Gates MRN: [id number] Date of Birth: Nov 09, 1929  [facility] CM/SW Contact  Ihor Gully, LCSW Phone Number: 06/17/2021, 4:46 PM  Clinical Narrative:    Referred to and accepted by Kerry Dory with [facility] for OT/PT.   Expected Discharge Plan: [place] Barriers to Discharge: Continued Medical Work up  Expected Discharge Plan and Services Expected Discharge Plan: [place]: PT, OT [facility] Agency: [place] ([place]) Date [facility] Agency Contacted: 06/17/21 Time [place]: [phone] Representative spoke with at [place]: [place] ([place]) Interventions    Readmission Risk Interventions No flowsheet data found.

## 2021-06-17 NOTE — Progress Notes (Signed)
*  PRELIMINARY RESULTS* Echocardiogram 2D Echocardiogram has been performed.  Kathryn Gates 06/17/2021, 1:03 PM

## 2021-06-18 DIAGNOSIS — R4781 Slurred speech: Secondary | ICD-10-CM | POA: Diagnosis not present

## 2021-06-18 DIAGNOSIS — R5381 Other malaise: Secondary | ICD-10-CM

## 2021-06-18 DIAGNOSIS — G459 Transient cerebral ischemic attack, unspecified: Secondary | ICD-10-CM

## 2021-06-18 DIAGNOSIS — I951 Orthostatic hypotension: Secondary | ICD-10-CM | POA: Diagnosis not present

## 2021-06-18 DIAGNOSIS — E782 Mixed hyperlipidemia: Secondary | ICD-10-CM

## 2021-06-18 DIAGNOSIS — K219 Gastro-esophageal reflux disease without esophagitis: Secondary | ICD-10-CM

## 2021-06-18 LAB — COMPREHENSIVE METABOLIC PANEL
ALT: 9 U/L (ref 0–44)
AST: 14 U/L — ABNORMAL LOW (ref 15–41)
Albumin: 3.3 g/dL — ABNORMAL LOW (ref 3.5–5.0)
Alkaline Phosphatase: 55 U/L (ref 38–126)
Anion gap: 6 (ref 5–15)
BUN: 17 mg/dL (ref 8–23)
CO2: 25 mmol/L (ref 22–32)
Calcium: 8.7 mg/dL — ABNORMAL LOW (ref 8.9–10.3)
Chloride: 102 mmol/L (ref 98–111)
Creatinine, Ser: 0.7 mg/dL (ref 0.44–1.00)
GFR, Estimated: >60 mL/min (ref >60)
Glucose, Bld: 90 mg/dL (ref 70–99)
Potassium: 4.2 mmol/L (ref 3.5–5.1)
Sodium: 133 mmol/L — ABNORMAL LOW (ref 135–145)
Total Bilirubin: 0.7 mg/dL (ref 0.3–1.2)
Total Protein: 5.5 g/dL — ABNORMAL LOW (ref 6.5–8.1)

## 2021-06-18 LAB — CBC
HCT: 29.3 % — ABNORMAL LOW (ref 36.0–46.0)
Hemoglobin: 9.7 g/dL — ABNORMAL LOW (ref 12.0–15.0)
MCH: 30.2 pg (ref 26.0–34.0)
MCHC: 33.1 g/dL (ref 30.0–36.0)
MCV: 91.3 fL (ref 80.0–100.0)
Platelets: 253 10*3/uL (ref 150–400)
RBC: 3.21 MIL/uL — ABNORMAL LOW (ref 3.87–5.11)
RDW: 13.5 % (ref 11.5–15.5)
WBC: 6.2 10*3/uL (ref 4.0–10.5)
nRBC: 0 % (ref 0.0–0.2)

## 2021-06-18 LAB — LIPID PANEL
Cholesterol: 204 mg/dL — ABNORMAL HIGH (ref 0–200)
HDL: 50 mg/dL (ref >40)
LDL Cholesterol: 135 mg/dL — ABNORMAL HIGH (ref 0–99)
Total CHOL/HDL Ratio: 4.1 RATIO
Triglycerides: 93 mg/dL (ref <150)
VLDL: 19 mg/dL (ref 0–40)

## 2021-06-18 LAB — HEMOGLOBIN A1C
Hgb A1c MFr Bld: 5 % (ref 4.8–5.6)
Mean Plasma Glucose: 96.8 mg/dL

## 2021-06-18 LAB — CORTISOL: Cortisol, Plasma: 21.4 ug/dL

## 2021-06-18 MED ORDER — RABEPRAZOLE SODIUM 20 MG PO TBEC: 20.0000 mg | DELAYED_RELEASE_TABLET | Freq: Two times a day (BID) | ORAL | 3 refills | Status: AC

## 2021-06-18 MED ORDER — MECLIZINE HCL 12.5 MG PO TABS: 12.5000 mg | ORAL_TABLET | Freq: Three times a day (TID) | ORAL | 0 refills | Status: AC | PRN

## 2021-06-18 MED ORDER — MIDODRINE HCL 2.5 MG PO TABS: 2.5000 mg | ORAL_TABLET | Freq: Two times a day (BID) | ORAL | 1 refills | Status: DC

## 2021-06-18 MED ORDER — CLOPIDOGREL BISULFATE 75 MG PO TABS: 75.0000 mg | ORAL_TABLET | Freq: Every day | ORAL | 11 refills | Status: AC

## 2021-06-18 NOTE — Progress Notes (Signed)
Unable to obtain urine specimen at this time because urine contaminated with stool.

## 2021-06-18 NOTE — Progress Notes (Signed)
Discharge teaching discussed with patient and patient's son over the phone. All questions answered. Patient to start midodrine for blood pressure support related to orthostatic hypotension episodes. Medication teaching and instructions reviewed. Meds sent to pharmacy for pick up.

## 2021-06-18 NOTE — Discharge Summary (Signed)
Physician Discharge Summary  Kathryn Gates KWI:[id number] DOB: [birth year] DOA: 06/16/2021  PCP: Curlene Labrum, MD  Admit date: 06/16/2021 Discharge date: 06/18/2021  Time spent: 35 minutes  Recommendations for Outpatient Follow-up:  Repeat basic metabolic panel to assess electrolytes and renal function trend. Reassess blood pressure and adjust antihypertensive regimen as needed. Consult for discussion of advance care planning recommended.   Discharge Diagnoses:  Principal Problem:   Slurred speech Active Problems:   Orthostatic hypotension   Gastroesophageal reflux disease   Physical deconditioning Hypokalemia/hyponatremia  Discharge Condition: Stable and improved.  Discharged home with instruction to follow-up with PCP and cardiology as an outpatient.  CODE STATUS: Full code.  Diet recommendation: Heart healthy diet.  Filed Weights   06/17/21 0544 06/18/21 0300  Weight: 40 kg 39.5 kg    History of present illness:  As per H&P written by Dr. Clearence Ped on 06/17/21  Kathryn Gates  is a 87 y.o. female, with history of acoustic neuroma, aortic regurgitation, generalized anxiety disorder, hyperlipidemia, hypertension, TIA, presents ED with chief complaint of slurred speech.  Patient reports that after supper today she had sudden onset of fatigue.  She then started having slurred speech.  Apparently the timeline was different from the ED provider.  Patient reports that at first it would come and go but it is gotten much worse.  She reports that she still has slurred speech and some stuttering.  She did break out in a cold sweat as well.  She has had body aches.  She did have a mild headache in the right parietal head.  She denies any change in vision or change in hearing.  She reports 70 symptoms of been intermittent for 5 years.  Patient denies any asymmetric extremity sensation or weakness.  She has no other complaints at this time.   Patient does not smoke, does not drink,  is not vaccinated for COVID.  Patient is full code.   In the ED Temp 98.4, heart rate 49-50, respiratory rate 11-14, blood pressure 197/64 No leukocytosis, hemoglobin 10.8, platelets 331 Chemistry panel is unremarkable Troponin is downtrending 24, 17 CT head shows chronic atrophic changes without acute abnormality Chest x-ray shows COPD without acute or active pulmonary disease EKG shows a heart rate of 47, sinus bradycardia, QTC 426 UA pending Caregiver had apparently mentioned to the ED doctor that patient may have overdosed on one of her blood pressure medication so she was given a good dose of glucagon.  UDS still pending. Admission requested for TIA/CVA work-up.  Hospital Course:  1-slurred speech/TIA -CT scan/MRI ruling out the presence of a stroke -2D echo with preserved ejection fraction and no wall motion normalities. -Overall symptoms has improved. -Aspirin has been discontinued and patient is started on Plavix for secondary prevention -Continue risk factor modifications. -Outpatient follow-up with PCP. -Physical therapy service has recommended a skilled nursing facility for rehab and conditioning at time of discharge; patient and family has declined services and would like to go home with home health.  2-hypertension/orthostatic hypotension -Patient advised to follow heart healthy diet -Low-dose midodrine twice a day has been added to prevent orthostatic symptoms -As needed low-dose Benicar recommended for systolic blood pressure above 150.  3-gastroesophageal reflux disease/GI prophylaxis -Continue PPI. -Dose adjusted to twice a day for better symptom management.  4-hyperlipidemia -Continue Zetia and heart healthy diet.  5-depression/anxiety -Overall stable mood and -Resume home medication regimen.  6-hypokalemia/hyponatremia -In the setting of dehydration -Electrolyte has been repleted and within normal limits at  discharge -Reassess basic metabolic panel  follow-up visit to assess electrolytes trend and stability.  Procedures: See below for x-ray reports.  Consultations: None  Discharge Exam: Vitals:   06/18/21 1100 06/18/21 1200  BP: (!) 124/39 114/70  Pulse:    Resp:    Temp:    SpO2:      General: Afebrile, no chest pain, no nausea, no vomiting.  Demonstrated improvement in her ability to articulate words and feeling ready to go home.  Still weak but has declined nursing facility. Cardiovascular: S1 and S2, no rubs, no gallops, no JVD. Respiratory: Good air movement bilaterally, no using accessory muscles; good saturation on room air Abdomen: Soft, nontender, positive bowel sounds, no distention. Extremities: No cyanosis or clubbing.  Discharge Instructions   Discharge Instructions     Diet - low sodium heart healthy   Complete by: As directed    Discharge instructions   Complete by: As directed    Take medications as prescribed Remember to take your time when changing position to minimize the chances of lightheadedness/falling Maintain adequate hydration Follow heart healthy diet Arrange follow-up with PCP in 10 days.   Increase activity slowly   Complete by: As directed       Allergies as of 06/18/2021   No Known Allergies      Medication List     STOP taking these medications    aspirin EC 81 MG tablet   traMADol 50 MG tablet Commonly known as: ULTRAM       TAKE these medications    acetaminophen 325 MG tablet Commonly known as: TYLENOL Take 650 mg by mouth every 6 (six) hours as needed for moderate pain.   ALPRAZolam 0.5 MG 24 hr tablet Commonly known as: XANAX XR Take 0.5 mg by mouth daily as needed for anxiety.   clopidogrel 75 MG tablet Commonly known as: Plavix Take 1 tablet (75 mg total) by mouth daily.   ezetimibe 10 MG tablet Commonly known as: ZETIA Take 1 tablet (10 mg total) by mouth daily.   meclizine 12.5 MG tablet Commonly known as: ANTIVERT Take 1 tablet (12.5 mg  total) by mouth 3 (three) times daily as needed for dizziness.   midodrine 2.5 MG tablet Commonly known as: PROAMATINE Take 1 tablet (2.5 mg total) by mouth 2 (two) times daily with a meal.   olmesartan 5 MG tablet Commonly known as: BENICAR Take 5 mg by mouth daily as needed (blood pressure over 150). What changed: Another medication with the same name was removed. Continue taking this medication, and follow the directions you see here.   polyvinyl alcohol 1.4 % ophthalmic solution Commonly known as: LIQUIFILM TEARS Place 1 drop into both eyes daily as needed for dry eyes.   RABEprazole 20 MG tablet Commonly known as: ACIPHEX Take 1 tablet (20 mg total) by mouth in the morning and at bedtime. What changed:  when to take this Another medication with the same name was removed. Continue taking this medication, and follow the directions you see here.   senna-docusate 8.6-50 MG tablet Commonly known as: Senokot-S Take 2 tablets by mouth at bedtime. For AFTER surgery, do not take if having diarrhea       No Known Allergies  Follow-up Information     Burdine, Virgina Evener, MD. Schedule an appointment as soon as possible for a visit in 10 day(s).   Specialty: Family Medicine Contact information: [address] [place] [address] [phone]  Arnoldo Lenis, MD .   Specialty: Cardiology Contact information: [place] [address] [phone]                 The results of significant diagnostics from this hospitalization (including imaging, microbiology, ancillary and laboratory) are listed below for reference.    Significant Diagnostic Studies: CT Head Wo Contrast  Result Date: 06/17/2021 CLINICAL DATA:  Generalized weakness EXAM: CT HEAD WITHOUT CONTRAST TECHNIQUE: Contiguous axial images were obtained from the base of the skull through the vertex without intravenous contrast. COMPARISON:  10/02/2020 FINDINGS: Brain: No evidence  of acute infarction, hemorrhage, hydrocephalus, extra-axial collection or mass lesion/mass effect. Chronic atrophic changes are noted. Vascular: No hyperdense vessel or unexpected calcification. Skull: Normal. Negative for fracture or focal lesion. Sinuses/Orbits: No acute finding. Other: None. IMPRESSION: Chronic atrophic changes without acute abnormality. Electronically Signed   By: Inez Catalina M.D.   On: 06/17/2021 00:52   MR ANGIO HEAD WO CONTRAST  Result Date: 06/17/2021 CLINICAL DATA:  Neuro deficit, acute, stroke suspected. Additional history provided: Weakness for 2 days. EXAM: MRI HEAD WITHOUT CONTRAST MRA HEAD WITHOUT CONTRAST TECHNIQUE: Multiplanar, multi-echo pulse sequences of the brain and surrounding structures were acquired without intravenous contrast. Angiographic images of the Circle of Willis were acquired using MRA technique without intravenous contrast. COMPARISON:  Head CT 06/17/2021. Brain MRI 12/18/2018. MRA head 12/12/2012. FINDINGS: MRI HEAD FINDINGS Intermittently motion degraded examination, limiting evaluation. Most notably, there is moderate/severe motion degradation of the sagittal T1 weighted sequence, mild-to-moderate motion degradation of the axial T2 GRE sequence, mild-to-moderate motion degradation of the axial T2 FLAIR sequence, mild-to-moderate motion degradation of the axial T1 weighted sequence and mild-to-moderate motion degradation of the coronal T2 TSE sequence. Brain: Mild generalized parenchymal atrophy. Chronic infarct with associated chronic blood products in the right corona radiata/basal ganglia, new from the brain MRI of 12/18/2018. Background mild multifocal T2 FLAIR hyperintense signal abnormality within the cerebral white matter, nonspecific but compatible with chronic small vessel ischemic disease. 7 mm mass within the left internal auditory canal, stable in size as compared to the prior MRI. This is compatible with a vestibular schwannoma. There is no  acute infarct. No extra-axial fluid collection. No midline shift. Vascular: Maintained flow voids within the proximal large arterial vessels. Skull and upper cervical spine: Within the limitations of motion degradation, no focal suspicious marrow lesion is identified. Sinuses/Orbits: Visualized orbits show no acute finding. Prior right lens replacement. Mild mucosal thickening within the left ethmoid sinuses. MRA HEAD FINDINGS Moderately motion degraded examination, limiting evaluation. This limits evaluation for, and accurate quantification of, intracranial arterial stenoses. This also limits evaluation for intracranial aneurysms. Anterior circulation: The intracranial internal carotid arteries are patent. Atherosclerotic irregularity of both vessels. Most notably, there is apparent moderate stenosis of the cavernous right ICA and apparent moderate stenosis of the paraclinoid left ICA. The M1 middle cerebral arteries are patent. Atherosclerotic irregularity of both vessels. Most notably, there is an apparent moderate stenosis within the distal right M1 segment. Atherosclerotic irregularity of the M2 and more distal middle cerebral artery vessels, bilaterally. Most notably, there are sites of apparent high-grade stenosis within proximal M2 MCA vessels, bilaterally. The anterior cerebral arteries are patent without high-grade proximal stenosis. Within the limitations of motion degradation, no definite intracranial aneurysm is identified. Posterior circulation: The intracranial vertebral arteries are patent. The non-dominant left vertebral artery terminates as the left PICA. There are apparent multifocal stenoses within the V4 vertebral arteries, bilaterally.  However, significant motion artifact at this level precludes accurate quantification of these stenoses. Atherosclerotic irregularity of the basilar artery without appreciable high-grade stenosis. The posterior cerebral arteries are patent to the P3 segment level.  Significant motion artifact precludes evaluation for patency of these vessels more distally. There are apparent sites of severe stenosis within the P1 and P2 right PCA, as well as P2 left PCA. A left posterior communicating artery is present. Anatomic variants: As described. IMPRESSION: MRI brain: 1. Motion degraded examination, as described. 2. No evidence of acute infarction. 3. 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 Background mild chronic small vessel ischemic changes within the cerebral white matter, not significantly progressed. 5. 7 mm left vestibular schwannoma, stable in size as compared to the prior MRI. 6. Mild generalized parenchymal atrophy. 7. Mild mucosal thickening within the left ethmoid sinus. MRA head: 1. Examination significantly limited by motion artifact, as described. 2. No intracranial proximal large vessel occlusion is identified. 3. Intracranial atherosclerotic disease, as described and progressed from the MRA head of 12/12/2012. Some of the described intracranial stenoses may be exaggerated by motion artifact on the current exam. Electronically Signed   By: Kellie Simmering D.O.   On: 06/17/2021 13:14   MR BRAIN WO CONTRAST  Result Date: 06/17/2021 CLINICAL DATA:  Neuro deficit, acute, stroke suspected. Additional history provided: Weakness for 2 days. EXAM: MRI HEAD WITHOUT CONTRAST MRA HEAD WITHOUT CONTRAST TECHNIQUE: Multiplanar, multi-echo pulse sequences of the brain and surrounding structures were acquired without intravenous contrast. Angiographic images of the Circle of Willis were acquired using MRA technique without intravenous contrast. COMPARISON:  Head CT 06/17/2021. Brain MRI 12/18/2018. MRA head 12/12/2012. FINDINGS: MRI HEAD FINDINGS Intermittently motion degraded examination, limiting evaluation. Most notably, there is moderate/severe motion degradation of the sagittal T1  weighted sequence, mild-to-moderate motion degradation of the axial T2 GRE sequence, mild-to-moderate motion degradation of the axial T2 FLAIR sequence, mild-to-moderate motion degradation of the axial T1 weighted sequence and mild-to-moderate motion degradation of the coronal T2 TSE sequence. Brain: Mild generalized parenchymal atrophy. Chronic infarct with associated chronic blood products in the right corona radiata/basal ganglia, new from the brain MRI of 12/18/2018. Background mild multifocal T2 FLAIR hyperintense signal abnormality within the cerebral white matter, nonspecific but compatible with chronic small vessel ischemic disease. 7 mm mass within the left internal auditory canal, stable in size as compared to the prior MRI. This is compatible with a vestibular schwannoma. There is no acute infarct. No extra-axial fluid collection. No midline shift. Vascular: Maintained flow voids within the proximal large arterial vessels. Skull and upper cervical spine: Within the limitations of motion degradation, no focal suspicious marrow lesion is identified. Sinuses/Orbits: Visualized orbits show no acute finding. Prior right lens replacement. Mild mucosal thickening within the left ethmoid sinuses. MRA HEAD FINDINGS Moderately motion degraded examination, limiting evaluation. This limits evaluation for, and accurate quantification of, intracranial arterial stenoses. This also limits evaluation for intracranial aneurysms. Anterior circulation: The intracranial internal carotid arteries are patent. Atherosclerotic irregularity of both vessels. Most notably, there is apparent moderate stenosis of the cavernous right ICA and apparent moderate stenosis of the paraclinoid left ICA. The M1 middle cerebral arteries are patent. Atherosclerotic irregularity of both vessels. Most notably, there is an apparent moderate stenosis within the distal right M1 segment. Atherosclerotic irregularity of the M2 and more distal middle  cerebral artery vessels, bilaterally. Most notably, there are sites of apparent high-grade  stenosis within proximal M2 MCA vessels, bilaterally. The anterior cerebral arteries are patent without high-grade proximal stenosis. Within the limitations of motion degradation, no definite intracranial aneurysm is identified. Posterior circulation: The intracranial vertebral arteries are patent. The non-dominant left vertebral artery terminates as the left PICA. There are apparent multifocal stenoses within the V4 vertebral arteries, bilaterally. However, significant motion artifact at this level precludes accurate quantification of these stenoses. Atherosclerotic irregularity of the basilar artery without appreciable high-grade stenosis. The posterior cerebral arteries are patent to the P3 segment level. Significant motion artifact precludes evaluation for patency of these vessels more distally. There are apparent sites of severe stenosis within the P1 and P2 right PCA, as well as P2 left PCA. A left posterior communicating artery is present. Anatomic variants: As described. IMPRESSION: MRI brain: 1. Motion degraded examination, as described. 2. No evidence of acute infarction. 3. 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 Background mild chronic small vessel ischemic changes within the cerebral white matter, not significantly progressed. 5. 7 mm left vestibular schwannoma, stable in size as compared to the prior MRI. 6. Mild generalized parenchymal atrophy. 7. Mild mucosal thickening within the left ethmoid sinus. MRA head: 1. Examination significantly limited by motion artifact, as described. 2. No intracranial proximal large vessel occlusion is identified. 3. Intracranial atherosclerotic disease, as described and progressed from the MRA head of 12/12/2012. Some of the described intracranial stenoses may be exaggerated by motion  artifact on the current exam. Electronically Signed   By: Kellie Simmering D.O.   On: 06/17/2021 13:14   DG Chest Portable 1 View  Result Date: 06/17/2021 CLINICAL DATA:  Altered mental status and generalized weakness. EXAM: PORTABLE CHEST 1 VIEW COMPARISON:  March 10, 2021 FINDINGS: The lungs are mildly hyperinflated. Mild, diffuse, chronic appearing increased lung markings are noted. There is no evidence of acute infiltrate, pleural effusion or pneumothorax. The heart size and mediastinal contours are within normal limits. There is marked severity calcification of the aortic arch. The visualized skeletal structures are unremarkable. IMPRESSION: Findings consistent with COPD without acute or active cardiopulmonary disease. Electronically Signed   By: Virgina Norfolk M.D.   On: 06/17/2021 01:02   ECHOCARDIOGRAM COMPLETE  Result Date: 06/17/2021    ECHOCARDIOGRAM REPORT   Patient Name:   Kathryn Gates Date of Exam: 06/17/2021 Medical Rec #:  [id number]       Height:       60.0 in Accession #:    [id number]      Weight:       88.2 lb Date of Birth:  Dec 18, 1929       BSA:          1.318 m Patient Age:    87 years        BP:           186/45 mmHg Patient Gender: F               HR:           49 bpm. Exam Location:  [facility] Procedure: 2D Echo, Cardiac Doppler and Color Doppler Indications:    Stroke  History:        Patient has prior history of Echocardiogram examinations, most                 recent 04/14/2021. TIA, Arrythmias:Bradycardia; Risk  Factors:Hypertension, Dyslipidemia and Non-Smoker.  Sonographer:    Wenda Low Referring Phys: [id number] ASIA B [place]  1. Left ventricular ejection fraction, by estimation, is 60 to 65%. The left ventricle has normal function. The left ventricle has no regional wall motion abnormalities. There is mild left ventricular hypertrophy. Left ventricular diastolic parameters are consistent with Grade I diastolic dysfunction  (impaired relaxation).  2. Right ventricular systolic function is normal. The right ventricular size is normal. There is normal pulmonary artery systolic pressure.  3. Left atrial size was mildly dilated.  4. Right atrial size was moderately dilated.  5. Mild mitral valve regurgitation.  6. AV is thickened, calcified with restricted motion. Peak and mean gradients through the valve are 34 and 19 mm Hg respectively. AVA (VTI) is 0.88 cm2 Dimensionless index is 0.26 consistent with moderate / severe AS (Note stroke volume index is good at  61). Copared to echo report from October 2022, mean gradient is less (30 to 19 mm Hg). . The aortic valve is tricuspid. Aortic valve regurgitation is mild.  7. The inferior vena cava is normal in size with greater than 50% respiratory variability, suggesting right atrial pressure of 3 mmHg. FINDINGS  Left Ventricle: Left ventricular ejection fraction, by estimation, is 60 to 65%. The left ventricle has normal function. The left ventricle has no regional wall motion abnormalities. The left ventricular internal cavity size was normal in size. There is  mild left ventricular hypertrophy. Left ventricular diastolic parameters are consistent with Grade I diastolic dysfunction (impaired relaxation). Right Ventricle: The right ventricular size is normal. Right vetricular wall thickness was not assessed. Right ventricular systolic function is normal. There is normal pulmonary artery systolic pressure. The tricuspid regurgitant velocity is 2.52 m/s, and with an assumed right atrial pressure of 8 mmHg, the estimated right ventricular systolic pressure is 29.9 mmHg. Left Atrium: Left atrial size was mildly dilated. Right Atrium: Right atrial size was moderately dilated. Pericardium: There is no evidence of pericardial effusion. Mitral Valve: There is mild thickening of the mitral valve leaflet(s). Mild mitral valve regurgitation. MV peak gradient, 3.9 mmHg. The mean mitral valve gradient is  1.0 mmHg. Tricuspid Valve: The tricuspid valve is normal in structure. Tricuspid valve regurgitation is mild. Aortic Valve: AV is thickened, calcified with restricted motion. Peak and mean gradients through the valve are 34 and 19 mm Hg respectively. AVA (VTI) is 0.88 cm2 Dimensionless index is 0.26 consistent with moderate / severe AS (Note stroke volume index is good at 61). Copared to echo report from October 2022, mean gradient is less (30 to 19 mm Hg). The aortic valve is tricuspid. Aortic valve regurgitation is mild. Aortic regurgitation PHT measures 703 msec. Aortic valve mean gradient measures 20.8 mmHg. Aortic valve peak gradient measures 36.8 mmHg. Aortic valve area, by VTI measures 0.81 cm. Pulmonic Valve: The pulmonic valve was normal in structure. Pulmonic valve regurgitation is mild. Aorta: The aortic root and ascending aorta are structurally normal, with no evidence of dilitation. Venous: The inferior vena cava is normal in size with greater than 50% respiratory variability, suggesting right atrial pressure of 3 mmHg. IAS/Shunts: No atrial level shunt detected by color flow Doppler.  LEFT VENTRICLE PLAX 2D LVIDd:         3.40 cm     Diastology LVIDs:         2.10 cm     LV e' medial:    4.28 cm/s LV PW:  1.00 cm     LV E/e' medial:  17.1 LV IVS:        1.30 cm     LV e' lateral:   4.11 cm/s LVOT diam:     2.00 cm     LV E/e' lateral: 17.8 LV SV:         80 LV SV Index:   61 LVOT Area:     3.14 cm  LV Volumes (MOD) LV vol d, MOD A2C: 56.8 ml LV vol d, MOD A4C: 42.0 ml LV vol s, MOD A2C: 17.2 ml LV vol s, MOD A4C: 18.5 ml LV SV MOD A2C:     39.6 ml LV SV MOD A4C:     42.0 ml LV SV MOD BP:      31.0 ml RIGHT VENTRICLE RV Basal diam:  3.25 cm RV Mid diam:    3.10 cm RV S prime:     9.02 cm/s TAPSE (M-mode): 2.1 cm LEFT ATRIUM             Index        RIGHT ATRIUM           Index LA diam:        4.10 cm 3.11 cm/m   RA Area:     18.80 cm LA Vol (A2C):   34.8 ml 26.40 ml/m  RA Volume:   56.80 ml   43.10 ml/m LA Vol (A4C):   52.4 ml 39.76 ml/m LA Biplane Vol: 46.8 ml 35.51 ml/m  AORTIC VALVE                     PULMONIC VALVE AV Area (Vmax):    0.83 cm      PV Vmax:       0.54 m/s AV Area (Vmean):   0.76 cm      PV Peak grad:  1.2 mmHg AV Area (VTI):     0.81 cm AV Vmax:           303.40 cm/s AV Vmean:          211.000 cm/s AV VTI:            0.992 m AV Peak Grad:      36.8 mmHg AV Mean Grad:      20.8 mmHg LVOT Vmax:         80.30 cm/s LVOT Vmean:        50.800 cm/s LVOT VTI:          0.256 m LVOT/AV VTI ratio: 0.26 AI PHT:            703 msec  AORTA Ao Root diam: 2.60 cm Ao Asc diam:  3.00 cm MITRAL VALVE               TRICUSPID VALVE MV Area (PHT): 1.79 cm    TR Peak grad:   25.4 mmHg MV Area VTI:   1.56 cm    TR Vmax:        252.00 cm/s MV Peak grad:  3.9 mmHg MV Mean grad:  1.0 mmHg    SHUNTS MV Vmax:       0.99 m/s    Systemic VTI:  0.26 m MV Vmean:      54.7 cm/s   Systemic Diam: 2.00 cm MV Decel Time: 423 msec MV E velocity: 73.30 cm/s MV A velocity: 89.50 cm/s MV E/A ratio:  0.82 Dorris Carnes MD Electronically signed by Dorris Carnes MD Signature Date/Time: 06/17/2021/2:39:12  PM    Final     Microbiology: Recent Results (from the past 240 hour(s))  Resp Panel by RT-PCR (Flu A&B, Covid) Nasopharyngeal Swab     Status: None   Collection Time: 06/17/21 12:21 AM   Specimen: Nasopharyngeal Swab; Nasopharyngeal(NP) swabs in vial transport medium  Result Value Ref Range Status   SARS Coronavirus 2 by RT PCR NEGATIVE NEGATIVE Final    Comment: (NOTE) SARS-CoV-2 target nucleic acids are NOT DETECTED.  The SARS-CoV-2 RNA is generally detectable in upper respiratory specimens during the acute phase of infection. The lowest concentration of SARS-CoV-2 viral copies this assay can detect is 138 copies/mL. A negative result does not preclude SARS-Cov-2 infection and should not be used as the sole basis for treatment or other patient management decisions. A negative result may occur with   improper specimen collection/handling, submission of specimen other than nasopharyngeal swab, presence of viral mutation(s) within the areas targeted by this assay, and inadequate number of viral copies(<138 copies/mL). A negative result must be combined with clinical observations, patient history, and epidemiological information. The expected result is Negative.  Fact Sheet for Patients:  EntrepreneurPulse.com.au  Fact Sheet for Healthcare Providers:  IncredibleEmployment.be  This test is no t yet approved or cleared by the [place] FDA and  has been authorized for detection and/or diagnosis of SARS-CoV-2 by FDA under an Emergency Use Authorization (EUA). This EUA will remain  in effect (meaning this test can be used) for the duration of the COVID-19 declaration under Section 564(b)(1) of the Act, 21 U.S.C.section 360bbb-3(b)(1), unless the authorization is terminated  or revoked sooner.       Influenza A by PCR NEGATIVE NEGATIVE Final   Influenza B by PCR NEGATIVE NEGATIVE Final    Comment: (NOTE) The Xpert Xpress SARS-CoV-2/FLU/RSV plus assay is intended as an aid in the diagnosis of influenza from Nasopharyngeal swab specimens and should not be used as a sole basis for treatment. Nasal washings and aspirates are unacceptable for Xpert Xpress SARS-CoV-2/FLU/RSV testing.  Fact Sheet for Patients: EntrepreneurPulse.com.au  Fact Sheet for Healthcare Providers: IncredibleEmployment.be  This test is not yet approved or cleared by the [place] FDA and has been authorized for detection and/or diagnosis of SARS-CoV-2 by FDA under an Emergency Use Authorization (EUA). This EUA will remain in effect (meaning this test can be used) for the duration of the COVID-19 declaration under Section 564(b)(1) of the Act, 21 U.S.C. section 360bbb-3(b)(1), unless the authorization is terminated  or revoked.  Performed at [facility], [address] [place], [place] [address]      Labs: Basic Metabolic Panel: Recent Labs  Lab 06/17/21 0033 06/17/21 0040 06/18/21 0458  NA 131* 132* 133*  K 3.4* 4.4 4.2  CL 97* 97* 102  CO2 26  --  25  GLUCOSE 165* 158* 90  BUN 15 17 17   CREATININE 0.81 0.80 0.70  CALCIUM 8.9  --  8.7*   Liver Function Tests: Recent Labs  Lab 06/17/21 0033 06/18/21 0458  AST 16 14*  ALT 10 9  ALKPHOS 68 55  BILITOT 0.5 0.7  PROT 6.8 5.5*  ALBUMIN 3.9 3.3*   CBC: Recent Labs  Lab 06/17/21 0033 06/17/21 0040 06/18/21 0458  WBC 6.5  --  6.2  NEUTROABS 4.3  --   --   HGB 10.8* 10.2* 9.7*  HCT 32.5* 30.0* 29.3*  MCV 90.0  --  91.3  PLT 331  --  253   CBG: Recent Labs  Lab  06/16/21 2350  GLUCAP 161*    Signed:  [name] MD.  [facility] 06/18/2021, 12:50 PM

## 2021-06-18 NOTE — TOC Transition Note (Signed)
Transition of Care [facility]) - CM/SW Discharge Note   Patient Details  Name: Kathryn Gates MRN: [id number] Date of Birth: 19-Nov-1929  Transition of Care [facility]) CM/SW Contact:  Natasha Bence, LCSW Phone Number: 06/18/2021, 12:56 PM   Clinical Narrative:    CSW notified Corene Cornea with [facility] of patient's readiness for discharge. Corene Cornea agreeable to provide services upon d/c. TOC signing off.    Final next level of care: [place] Barriers to Discharge: Barriers Resolved   Patient Goals and CMS Choice Patient states their goals for this hospitalization and ongoing recovery are:: Return home with [facility] CMS Medicare.gov Compare Post Acute Care list provided to:: Patient Choice offered to / list presented to : Patient  Discharge Placement                    Patient and family notified of of transfer: 06/18/21  Discharge Plan and Services                          HH Arranged: PT, OT [facility] Agency: [place] ([place]) Date [place]: 06/18/21 Time [place]: 1255 Representative spoke with at [place]: [place] ([place]) Interventions     Readmission Risk Interventions No flowsheet data found.

## 2021-06-18 NOTE — Progress Notes (Addendum)
Orthostatic VS for the past 24 hrs:  BP- Lying Pulse- Lying BP- Sitting Pulse- Sitting BP- Standing at 0 minutes Pulse- Standing at 0 minutes  06/18/21 0910 -- -- -- -- (!) 137/39 99  06/18/21 0908 -- -- 127/42 86 -- --  06/18/21 0906 (!) 124/38 82 -- -- -- --     Patient on initial change from lying to sitting position c/o some dizziness which subsided after 20 seconds. No subsequent episodes of dizziness noted. Patient able to ambulate in room with cane and stand by contact assist with small increase in heart rate to 101. Patient able to maintain balance, no dizziness reported and no c/o feeling lightheaded. Patient washed her hands and face at the sink and brushed her teeth.

## 2021-06-22 ENCOUNTER — Telehealth: Payer: Self-pay | Admitting: Cardiology

## 2021-06-22 NOTE — Telephone Encounter (Signed)
Has she been eating and drinking, from recent admission looks like she had issues with dehydration  Zandra Abts MD

## 2021-06-22 NOTE — Telephone Encounter (Signed)
131/61 60 BP currently  Been drinking more the last few days & working on her appetite.    Slurred speech - therapist coming out to see her.    No c/o chest pain, sob, or dizziness.  Caregiver states that her BP runs lower in the mornings then they give her one tab of the Midodrine & makes the BP go up.  States that the BP just seems to gradually go up the rest of the day.  Yesterday at 8:00 pm - BP was 172/74.  She also has as needed Benicar for systolic BP > 009.

## 2021-06-22 NOTE — Telephone Encounter (Signed)
Pt c/o BP issue: STAT if pt c/o blurred vision, one-sided weakness or slurred speech  1. What are your last 5 BP readings?  92/48 HR 62 95/47 HR 60 76/47 HR 68  2. Are you having any other symptoms (ex. Dizziness, headache, blurred vision, passed out)? Fatigue. She hasn't done anything but sleep the past couple days  3. What is your BP issue? Caregiver is with the patient. She is concerned about the low BP readings

## 2021-06-23 ENCOUNTER — Emergency Department (HOSPITAL_COMMUNITY): Payer: Medicare Other

## 2021-06-23 ENCOUNTER — Encounter (HOSPITAL_COMMUNITY): Payer: Self-pay

## 2021-06-23 ENCOUNTER — Other Ambulatory Visit: Payer: Self-pay

## 2021-06-23 ENCOUNTER — Emergency Department (HOSPITAL_COMMUNITY)
Admission: EM | Admit: 2021-06-23 | Discharge: 2021-06-23 | Disposition: A | Discharge status: Home / Self Care | Payer: Medicare Other | Attending: Emergency Medicine | Admitting: Emergency Medicine

## 2021-06-23 DIAGNOSIS — Z7901 Long term (current) use of anticoagulants: Secondary | ICD-10-CM | POA: Diagnosis not present

## 2021-06-23 DIAGNOSIS — I951 Orthostatic hypotension: Secondary | ICD-10-CM | POA: Diagnosis not present

## 2021-06-23 DIAGNOSIS — S0990XA Unspecified injury of head, initial encounter: Secondary | ICD-10-CM | POA: Insufficient documentation

## 2021-06-23 DIAGNOSIS — W19XXXA Unspecified fall, initial encounter: Secondary | ICD-10-CM

## 2021-06-23 DIAGNOSIS — Z79899 Other long term (current) drug therapy: Secondary | ICD-10-CM | POA: Insufficient documentation

## 2021-06-23 DIAGNOSIS — I10 Essential (primary) hypertension: Secondary | ICD-10-CM | POA: Diagnosis not present

## 2021-06-23 DIAGNOSIS — W1839XA Other fall on same level, initial encounter: Secondary | ICD-10-CM | POA: Diagnosis not present

## 2021-06-23 LAB — CBC WITH DIFFERENTIAL/PLATELET
Abs Immature Granulocytes: 0.02 10*3/uL (ref 0.00–0.07)
Basophils Absolute: 0 10*3/uL (ref 0.0–0.1)
Basophils Relative: 0 %
Eosinophils Absolute: 0.1 10*3/uL (ref 0.0–0.5)
Eosinophils Relative: 2 %
HCT: 30.3 % — ABNORMAL LOW (ref 36.0–46.0)
Hemoglobin: 10.3 g/dL — ABNORMAL LOW (ref 12.0–15.0)
Immature Granulocytes: 0 %
Lymphocytes Relative: 17 %
Lymphs Abs: 0.9 10*3/uL (ref 0.7–4.0)
MCH: 30.5 pg (ref 26.0–34.0)
MCHC: 34 g/dL (ref 30.0–36.0)
MCV: 89.6 fL (ref 80.0–100.0)
Monocytes Absolute: 0.3 10*3/uL (ref 0.1–1.0)
Monocytes Relative: 5 %
Neutro Abs: 4.2 10*3/uL (ref 1.7–7.7)
Neutrophils Relative %: 76 %
Platelets: 318 10*3/uL (ref 150–400)
RBC: 3.38 MIL/uL — ABNORMAL LOW (ref 3.87–5.11)
RDW: 13.7 % (ref 11.5–15.5)
WBC: 5.5 10*3/uL (ref 4.0–10.5)
nRBC: 0 % (ref 0.0–0.2)

## 2021-06-23 LAB — COMPREHENSIVE METABOLIC PANEL
ALT: 9 U/L (ref 0–44)
AST: 15 U/L (ref 15–41)
Albumin: 3.9 g/dL (ref 3.5–5.0)
Alkaline Phosphatase: 61 U/L (ref 38–126)
Anion gap: 9 (ref 5–15)
BUN: 17 mg/dL (ref 8–23)
CO2: 26 mmol/L (ref 22–32)
Calcium: 8.9 mg/dL (ref 8.9–10.3)
Chloride: 95 mmol/L — ABNORMAL LOW (ref 98–111)
Creatinine, Ser: 1.23 mg/dL — ABNORMAL HIGH (ref 0.44–1.00)
GFR, Estimated: 42 mL/min — ABNORMAL LOW (ref >60)
Glucose, Bld: 136 mg/dL — ABNORMAL HIGH (ref 70–99)
Potassium: 3.5 mmol/L (ref 3.5–5.1)
Sodium: 130 mmol/L — ABNORMAL LOW (ref 135–145)
Total Bilirubin: 0.5 mg/dL (ref 0.3–1.2)
Total Protein: 6.4 g/dL — ABNORMAL LOW (ref 6.5–8.1)

## 2021-06-23 MED ORDER — MIDODRINE HCL 5 MG PO TABS
2.5000 mg | ORAL_TABLET | Freq: Three times a day (TID) | ORAL | Status: AC
  Administered 2021-06-23: 17:00:00 2.5 mg via ORAL
  Filled 2021-06-23: qty 1

## 2021-06-23 MED ORDER — SODIUM CHLORIDE 0.9 % IV BOLUS
1000.0000 mL | Freq: Once | INTRAVENOUS | Status: AC
  Administered 2021-06-23: 13:00:00 1000 mL via INTRAVENOUS

## 2021-06-23 NOTE — ED Notes (Signed)
Gave pt water ok per RN DW

## 2021-06-23 NOTE — Discharge Instructions (Signed)
Your CT scan is negative for any injuries from today's fall.  Your exam and blood pressures suggest that you were dehydrated today.  It is important that you make sure you are drinking plenty of fluids.  Also make sure you are taking that new medication called midodrine twice daily to help support your blood pressure.  You need to have a repeat blood pressure check with your primary doctor within the next week.  If your blood pressures remain low this new medication may need to be adjusted.  Use caution with standing, I recommend standing slowly and adjusting to a standing position by holding onto her walker before you start walking to make sure you are stable and safe to walk.

## 2021-06-23 NOTE — Telephone Encounter (Signed)
She has not been taking the evening dose because that is the time when her BP's start going back up.

## 2021-06-23 NOTE — ED Provider Notes (Signed)
[facility] Provider Note   CSN: [id number] Arrival date & time: 06/23/21  1018     History Chief Complaint  Patient presents with   Weakness    Kathryn Gates is a 87 y.o. female with history as outlined below, most significant for aortic stenosis, hypertension, however more recently has been she has been having problems with orthostatic hypotension presenting for evaluation of head injury sustained in a fall which occurred just prior to arrival.  She describes sliding out of her chair at home and hit her head against a cabinet.  She denies neck pain and LOC.  She states she was able to get herself up after she fell.  She was recently admitted to this hospital, discharged on December 24 where she was treated for a presumptive TIA as she presented with slurred speech and was found to have orthostatic hypotension as well, work-up was negative for acute CVA.  She was started on midodrine but states she has not taken this medication this morning she denies dizziness but does endorse generalized weakness.  She does have home health that is there with her during the day in her home but otherwise lives alone with close watch by family members.  She denies any other complaints with today's injury.  Specifically she denies chest pain, shortness of breath, palpitations nausea or vomiting, headache, neck pain or stiffness, dizziness.  The history is provided by the patient.      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 Seen on 2D echo in 2013 (moderate) but not mentioned in 11/2013 echo.   Aortic stenosis    a. 2D Echo 11/2013: mild AS.   Carotid stenosis    a. Duplex 1-39% BICA in 11/2013.   Dyspnea    Generalized anxiety disorder    Headache    a. Possible tension HA versus migraine variant.   Hyperlipidemia    Hypertension    a. Labile BP.   Hyponatremia    TIA (transient ischemic attack)     Patient Active  Problem List   Diagnosis Date Noted   Orthostatic hypotension    Gastroesophageal reflux disease    Physical deconditioning    Slurred speech 06/17/2021   Adnexal mass 05/02/2021   Loss of weight 07/01/2020   Dysphagia 07/01/2020   Loss of taste 07/01/2020   Sinus bradycardia 10/08/2014   Headache    Generalized anxiety disorder    Carotid stenosis    Hyperlipidemia    Aortic stenosis 12/05/2013   Acoustic neuroma ([place]) 12/13/2012   TIA (transient ischemic attack) 12/12/2012   Hypertension 12/12/2012   Hyperkalemia 12/12/2012   Hyponatremia 12/12/2012   Aortic stenosis, mild 01/14/2009   ABNORMAL ECHOCARDIOGRAM 01/14/2009    Past Surgical History:  Procedure Laterality Date   CATARACT EXTRACTION     [place] OOPHERECTOMY Bilateral 05/26/2021   Procedure: XI ROBOTIC ASSISTED SALPINGO OOPHORECTOMY;  Surgeon: Lafonda Mosses, MD;  Location: [facility];  Service: Gynecology;  Laterality: Bilateral;   WRIST SURGERY  2007   Right     OB History     Gravida  1   Para  1   Term      Preterm      AB      Living         SAB      IAB  Ectopic      Multiple      Live Births              Family History  Problem Relation Age of Onset   Stroke Other    Heart failure Other        CHF    Social History   Tobacco Use   Smoking status: Never   Smokeless tobacco: Never   Tobacco comments:    Tobacco use-no  Vaping Use   Vaping Use: Never used  Substance Use Topics   Alcohol use: No    Alcohol/week: 0.0 standard drinks   Drug use: No    Home Medications Prior to Admission medications   Medication Sig Start Date End Date Taking? Authorizing Provider  acetaminophen (TYLENOL) 325 MG tablet Take 650 mg by mouth every 6 (six) hours as needed for moderate pain.   Yes [provider]  clopidogrel (PLAVIX) 75 MG tablet Take 1 tablet (75 mg total) by mouth daily. 06/18/21 06/18/22  Yes [name], MD  meclizine (ANTIVERT) 12.5 MG tablet Take 1 tablet (12.5 mg total) by mouth 3 (three) times daily as needed for dizziness. 06/18/21  Yes [name], MD  olmesartan (BENICAR) 5 MG tablet Take 5 mg by mouth daily as needed (blood pressure over 150). 12/30/19  Yes [provider]  polyvinyl alcohol (LIQUIFILM TEARS) 1.4 % ophthalmic solution Place 1 drop into both eyes daily as needed for dry eyes.   Yes [provider]  RABEprazole (ACIPHEX) 20 MG tablet Take 1 tablet (20 mg total) by mouth in the morning and at bedtime. 06/18/21  Yes [name], MD  ezetimibe (ZETIA) 10 MG tablet Take 1 tablet (10 mg total) by mouth daily. Patient not taking: Reported on 06/23/2021 09/26/19   Verta Ellen., NP  midodrine (PROAMATINE) 2.5 MG tablet Take 1 tablet (2.5 mg total) by mouth 2 (two) times daily with a meal. 06/18/21   [name], MD  senna-docusate (SENOKOT-S) 8.6-50 MG tablet Take 2 tablets by mouth at bedtime. For AFTER surgery, do not take if having diarrhea Patient not taking: Reported on 06/23/2021 05/02/21   Joylene John D, NP    Allergies    Patient has no known allergies.  Review of Systems   Review of Systems  Constitutional:  Negative for chills and fever.  HENT:  Negative for congestion and sore throat.   Eyes: Negative.   Respiratory:  Negative for chest tightness and shortness of breath.   Cardiovascular:  Negative for chest pain and palpitations.  Gastrointestinal:  Negative for abdominal pain, nausea and vomiting.  Genitourinary: Negative.   Musculoskeletal:  Negative for arthralgias, joint swelling and neck pain.  Skin: Negative.  Negative for rash and wound.  Neurological:  Positive for light-headedness. Negative for dizziness, weakness, numbness and headaches.  Psychiatric/Behavioral: Negative.     Physical Exam Updated Vital Signs BP (!) 135/47    Pulse (!) 47    Temp 97.7 F (36.5 C)    Resp 16    Ht 5' (1.524 m)     Wt 40.8 kg    SpO2 98%    BMI 17.58 kg/m   Physical Exam Vitals and nursing note reviewed.  Constitutional:      Appearance: She is well-developed.  HENT:     Head: Normocephalic and atraumatic.     Mouth/Throat:     Mouth: Mucous membranes are moist.  Eyes:     Conjunctiva/sclera: Conjunctivae normal.  Cardiovascular:  Rate and Rhythm: Normal rate and regular rhythm.     Heart sounds: Normal heart sounds.  Pulmonary:     Effort: Pulmonary effort is normal.     Breath sounds: Normal breath sounds. No wheezing.  Abdominal:     General: Bowel sounds are normal.     Palpations: Abdomen is soft.     Tenderness: There is no abdominal tenderness.  Musculoskeletal:        General: Normal range of motion.     Cervical back: Normal range of motion. No tenderness.  Skin:    General: Skin is warm and dry.  Neurological:     General: No focal deficit present.     Mental Status: She is alert and oriented to person, place, and time.     Cranial Nerves: No cranial nerve deficit.     Sensory: No sensory deficit.     Motor: No tremor or pronator drift.     Coordination: Finger-Nose-Finger Test normal. Rapid alternating movements normal.     Gait: Gait normal.     Deep Tendon Reflexes:     Reflex Scores:      Bicep reflexes are 2+ on the right side and 2+ on the left side. Psychiatric:        Mood and Affect: Mood normal.    ED Results / Procedures / Treatments   Labs (all labs ordered are listed, but only abnormal results are displayed) Labs Reviewed  CBC WITH DIFFERENTIAL/PLATELET - Abnormal; Notable for the following components:      Result Value   RBC 3.38 (*)    Hemoglobin 10.3 (*)    HCT 30.3 (*)    All other components within normal limits  COMPREHENSIVE METABOLIC PANEL - Abnormal; Notable for the following components:   Sodium 130 (*)    Chloride 95 (*)    Glucose, Bld 136 (*)    Creatinine, Ser 1.23 (*)    Total Protein 6.4 (*)    GFR, Estimated 42 (*)    All  other components within normal limits  URINALYSIS, ROUTINE W REFLEX MICROSCOPIC    EKG EKG Interpretation  Date/Time:  Thursday June 23 2021 10:31:04 EST Ventricular Rate:  59 PR Interval:  184 QRS Duration: 98 QT Interval:  439 QTC Calculation: 435 R Axis:   64 Text Interpretation: Sinus rhythm Normal ECG No significant change since last tracing Confirmed by Calvert Cantor [phone]) on 06/23/2021 10:55:31 AM  Radiology CT Head Wo Contrast  Result Date: 06/23/2021 CLINICAL DATA:  Head trauma EXAM: CT HEAD WITHOUT CONTRAST TECHNIQUE: Contiguous axial images were obtained from the base of the skull through the vertex without intravenous contrast. COMPARISON:  None. FINDINGS: Brain: No evidence of acute infarction, hemorrhage, hydrocephalus, extra-axial collection or mass lesion/mass effect. Vascular: No hyperdense vessel or unexpected calcification. Skull: Normal. Negative for fracture or focal lesion. Sinuses/Orbits: No acute finding. Other: None. IMPRESSION: No acute intracranial abnormality. Electronically Signed   By: Yetta Glassman M.D.   On: 06/23/2021 11:45    Procedures Procedures   Medications Ordered in ED Medications  midodrine (PROAMATINE) tablet 2.5 mg (2.5 mg Oral Given 06/23/21 1640)  sodium chloride 0.9 % bolus 1,000 mL (0 mLs Intravenous Stopped 06/23/21 1528)    ED Course  I have reviewed the triage vital signs and the nursing notes.  Pertinent labs & imaging results that were available during my care of the patient were reviewed by me and considered in my medical decision making (see chart for details).  MDM Rules/Calculators/A&P                         Labs and imaging reviewed and discussed with patient, also discussed with her son Mr. Bena Kobel via phone who is her listed contact.  She has no obvious injuries from today's fall.  We did obtain orthostatic vital signs and she was initially orthostatic.  She was given IV fluids after which we repeated  her blood pressures and they are improved and she was no longer feeling weak or lightheaded.  She had also not had her midodrine morning dose prior to arrival, this was given her and her blood pressure was much improved after this treatment.  She was strongly encouraged that she needs to take this medicine twice daily, and it can be increased if she continues to have problems with her blood pressure, she was asked to follow-up with her primary doctor for recheck within the next week for blood pressure rechecks and to evaluate her medications.  We also discussed increasing her fluid intake to avoid dehydration.  Return precautions were also outlined.    Final Clinical Impression(s) / ED Diagnoses Final diagnoses:  Fall, initial encounter  Minor head injury, initial encounter  Orthostatic hypotension    Rx / DC Orders ED Discharge Orders     None        Landis Martins 06/23/21 1832    Truddie Hidden, MD 06/24/21 [phone]

## 2021-06-23 NOTE — ED Notes (Signed)
Tolerates po fluid

## 2021-06-23 NOTE — ED Triage Notes (Signed)
Patient complaining of leg weakness for 4 days. Did have a fall a couple days ago and slid out of the chair this morning but denies any pain.

## 2021-06-23 NOTE — Telephone Encounter (Signed)
Midodrine is written as twice a day. If taking twice a day but low bp's first thing in the morning may try taking her evening midodrine later so more in her system first thing in the morning   Zandra Abts MD

## 2021-06-23 NOTE — ED Notes (Signed)
Patient became very dizzy upon standing.  Needed assistance while standing. B/P standing was 74/38

## 2021-06-24 MED ORDER — MIDODRINE HCL 2.5 MG PO TABS: 2.5000 mg | ORAL_TABLET | Freq: Two times a day (BID) | ORAL | Status: AC

## 2021-06-24 NOTE — Telephone Encounter (Signed)
Jana Half (caregiver) notified & verbalized understanding.

## 2021-06-24 NOTE — Telephone Encounter (Signed)
I would start taking the evening dose before bed, that way bp's wont be so low first thing in the morning   J Richa Shor MD

## 2021-06-27 ENCOUNTER — Inpatient Hospital Stay (HOSPITAL_COMMUNITY)
Admission: EM | Admit: 2021-06-27 | Discharge: 2021-07-27 | DRG: 175 | Disposition: E | Payer: Medicare Other | Attending: Family Medicine | Admitting: Family Medicine

## 2021-06-27 ENCOUNTER — Other Ambulatory Visit: Payer: Self-pay

## 2021-06-27 ENCOUNTER — Emergency Department (HOSPITAL_COMMUNITY): Payer: Medicare Other

## 2021-06-27 DIAGNOSIS — F03A4 Unspecified dementia, mild, with anxiety: Secondary | ICD-10-CM | POA: Diagnosis present

## 2021-06-27 DIAGNOSIS — D649 Anemia, unspecified: Secondary | ICD-10-CM | POA: Diagnosis present

## 2021-06-27 DIAGNOSIS — R0602 Shortness of breath: Secondary | ICD-10-CM

## 2021-06-27 DIAGNOSIS — I2510 Atherosclerotic heart disease of native coronary artery without angina pectoris: Secondary | ICD-10-CM | POA: Diagnosis present

## 2021-06-27 DIAGNOSIS — R197 Diarrhea, unspecified: Secondary | ICD-10-CM | POA: Diagnosis present

## 2021-06-27 DIAGNOSIS — Z9079 Acquired absence of other genital organ(s): Secondary | ICD-10-CM

## 2021-06-27 DIAGNOSIS — G928 Other toxic encephalopathy: Secondary | ICD-10-CM | POA: Diagnosis present

## 2021-06-27 DIAGNOSIS — R112 Nausea with vomiting, unspecified: Secondary | ICD-10-CM | POA: Diagnosis present

## 2021-06-27 DIAGNOSIS — Z90722 Acquired absence of ovaries, bilateral: Secondary | ICD-10-CM

## 2021-06-27 DIAGNOSIS — I9589 Other hypotension: Secondary | ICD-10-CM | POA: Insufficient documentation

## 2021-06-27 DIAGNOSIS — J9600 Acute respiratory failure, unspecified whether with hypoxia or hypercapnia: Secondary | ICD-10-CM | POA: Diagnosis present

## 2021-06-27 DIAGNOSIS — I951 Orthostatic hypotension: Secondary | ICD-10-CM | POA: Diagnosis present

## 2021-06-27 DIAGNOSIS — Z7902 Long term (current) use of antithrombotics/antiplatelets: Secondary | ICD-10-CM

## 2021-06-27 DIAGNOSIS — R000 Tachycardia, unspecified: Secondary | ICD-10-CM | POA: Diagnosis present

## 2021-06-27 DIAGNOSIS — J9601 Acute respiratory failure with hypoxia: Secondary | ICD-10-CM | POA: Diagnosis present

## 2021-06-27 DIAGNOSIS — Z66 Do not resuscitate: Secondary | ICD-10-CM | POA: Diagnosis present

## 2021-06-27 DIAGNOSIS — Z79899 Other long term (current) drug therapy: Secondary | ICD-10-CM

## 2021-06-27 DIAGNOSIS — Z515 Encounter for palliative care: Secondary | ICD-10-CM

## 2021-06-27 DIAGNOSIS — R031 Nonspecific low blood-pressure reading: Secondary | ICD-10-CM | POA: Insufficient documentation

## 2021-06-27 DIAGNOSIS — F411 Generalized anxiety disorder: Secondary | ICD-10-CM | POA: Diagnosis present

## 2021-06-27 DIAGNOSIS — I10 Essential (primary) hypertension: Secondary | ICD-10-CM | POA: Diagnosis present

## 2021-06-27 DIAGNOSIS — A084 Viral intestinal infection, unspecified: Secondary | ICD-10-CM | POA: Diagnosis present

## 2021-06-27 DIAGNOSIS — K219 Gastro-esophageal reflux disease without esophagitis: Secondary | ICD-10-CM | POA: Diagnosis present

## 2021-06-27 DIAGNOSIS — Z9049 Acquired absence of other specified parts of digestive tract: Secondary | ICD-10-CM

## 2021-06-27 DIAGNOSIS — Z7189 Other specified counseling: Secondary | ICD-10-CM | POA: Diagnosis not present

## 2021-06-27 DIAGNOSIS — N390 Urinary tract infection, site not specified: Secondary | ICD-10-CM | POA: Insufficient documentation

## 2021-06-27 DIAGNOSIS — J810 Acute pulmonary edema: Secondary | ICD-10-CM | POA: Diagnosis present

## 2021-06-27 DIAGNOSIS — R570 Cardiogenic shock: Secondary | ICD-10-CM | POA: Diagnosis present

## 2021-06-27 DIAGNOSIS — E860 Dehydration: Secondary | ICD-10-CM | POA: Diagnosis present

## 2021-06-27 DIAGNOSIS — I352 Nonrheumatic aortic (valve) stenosis with insufficiency: Secondary | ICD-10-CM | POA: Diagnosis present

## 2021-06-27 DIAGNOSIS — M316 Other giant cell arteritis: Secondary | ICD-10-CM | POA: Insufficient documentation

## 2021-06-27 DIAGNOSIS — Z8673 Personal history of transient ischemic attack (TIA), and cerebral infarction without residual deficits: Secondary | ICD-10-CM

## 2021-06-27 DIAGNOSIS — Z20822 Contact with and (suspected) exposure to covid-19: Secondary | ICD-10-CM | POA: Diagnosis present

## 2021-06-27 DIAGNOSIS — N179 Acute kidney failure, unspecified: Secondary | ICD-10-CM | POA: Diagnosis present

## 2021-06-27 DIAGNOSIS — I2699 Other pulmonary embolism without acute cor pulmonale: Secondary | ICD-10-CM | POA: Diagnosis present

## 2021-06-27 DIAGNOSIS — E785 Hyperlipidemia, unspecified: Secondary | ICD-10-CM | POA: Diagnosis present

## 2021-06-27 DIAGNOSIS — Z8249 Family history of ischemic heart disease and other diseases of the circulatory system: Secondary | ICD-10-CM

## 2021-06-27 DIAGNOSIS — I214 Non-ST elevation (NSTEMI) myocardial infarction: Secondary | ICD-10-CM | POA: Insufficient documentation

## 2021-06-27 DIAGNOSIS — I472 Ventricular tachycardia, unspecified: Secondary | ICD-10-CM | POA: Diagnosis not present

## 2021-06-27 DIAGNOSIS — R0902 Hypoxemia: Secondary | ICD-10-CM

## 2021-06-27 LAB — URINALYSIS, ROUTINE W REFLEX MICROSCOPIC
Bilirubin Urine: NEGATIVE
Glucose, UA: NEGATIVE mg/dL
Ketones, ur: 20 mg/dL — AB
Leukocytes,Ua: NEGATIVE
Nitrite: NEGATIVE
Protein, ur: 30 mg/dL — AB
Specific Gravity, Urine: 1.031 — ABNORMAL HIGH (ref 1.005–1.030)
pH: 5 (ref 5.0–8.0)

## 2021-06-27 LAB — COMPREHENSIVE METABOLIC PANEL
ALT: 18 U/L (ref 0–44)
AST: 32 U/L (ref 15–41)
Albumin: 4.6 g/dL (ref 3.5–5.0)
Alkaline Phosphatase: 72 U/L (ref 38–126)
Anion gap: 16 — ABNORMAL HIGH (ref 5–15)
BUN: 14 mg/dL (ref 8–23)
CO2: 21 mmol/L — ABNORMAL LOW (ref 22–32)
Calcium: 9 mg/dL (ref 8.9–10.3)
Chloride: 101 mmol/L (ref 98–111)
Creatinine, Ser: 0.99 mg/dL (ref 0.44–1.00)
GFR, Estimated: 55 mL/min — ABNORMAL LOW (ref >60)
Glucose, Bld: 152 mg/dL — ABNORMAL HIGH (ref 70–99)
Potassium: 3.5 mmol/L (ref 3.5–5.1)
Sodium: 138 mmol/L (ref 135–145)
Total Bilirubin: 1.2 mg/dL (ref 0.3–1.2)
Total Protein: 7.6 g/dL (ref 6.5–8.1)

## 2021-06-27 LAB — RESP PANEL BY RT-PCR (FLU A&B, COVID) ARPGX2
Influenza A by PCR: NEGATIVE
Influenza B by PCR: NEGATIVE
SARS Coronavirus 2 by RT PCR: NEGATIVE

## 2021-06-27 LAB — CBC
HCT: 31.5 % — ABNORMAL LOW (ref 36.0–46.0)
Hemoglobin: 10.7 g/dL — ABNORMAL LOW (ref 12.0–15.0)
MCH: 30.7 pg (ref 26.0–34.0)
MCHC: 34 g/dL (ref 30.0–36.0)
MCV: 90.3 fL (ref 80.0–100.0)
Platelets: 295 10*3/uL (ref 150–400)
RBC: 3.49 MIL/uL — ABNORMAL LOW (ref 3.87–5.11)
RDW: 13.9 % (ref 11.5–15.5)
WBC: 7.1 10*3/uL (ref 4.0–10.5)
nRBC: 0 % (ref 0.0–0.2)

## 2021-06-27 LAB — LIPASE, BLOOD: Lipase: 45 U/L (ref 11–51)

## 2021-06-27 MED ORDER — SODIUM CHLORIDE 0.9 % IV SOLN: Freq: Once | INTRAVENOUS | Status: AC

## 2021-06-27 MED ORDER — ONDANSETRON HCL 4 MG PO TABS: 4.0000 mg | ORAL_TABLET | Freq: Four times a day (QID) | ORAL | Status: DC | PRN

## 2021-06-27 MED ORDER — IOHEXOL 350 MG/ML SOLN
80.0000 mL | Freq: Once | INTRAVENOUS | Status: AC | PRN
  Administered 2021-06-27: 80 mL via INTRAVENOUS

## 2021-06-27 MED ORDER — LORAZEPAM 2 MG/ML IJ SOLN
0.5000 mg | Freq: Once | INTRAMUSCULAR | Status: AC
  Administered 2021-06-27: 0.5 mg via INTRAVENOUS
  Filled 2021-06-27: qty 1

## 2021-06-27 MED ORDER — POLYVINYL ALCOHOL 1.4 % OP SOLN: 1.0000 [drp] | Freq: Every day | OPHTHALMIC | Status: DC | PRN

## 2021-06-27 MED ORDER — LACTATED RINGERS IV BOLUS
1000.0000 mL | Freq: Once | INTRAVENOUS | Status: AC
  Administered 2021-06-27: 1000 mL via INTRAVENOUS

## 2021-06-27 MED ORDER — ONDANSETRON HCL 4 MG/2ML IJ SOLN
4.0000 mg | Freq: Once | INTRAMUSCULAR | Status: AC | PRN
  Administered 2021-06-27: 4 mg via INTRAVENOUS
  Filled 2021-06-27: qty 2

## 2021-06-27 MED ORDER — SODIUM CHLORIDE 0.9% FLUSH
3.0000 mL | Freq: Once | INTRAVENOUS | Status: AC
  Administered 2021-06-27: 3 mL via INTRAVENOUS

## 2021-06-27 MED ORDER — SODIUM CHLORIDE 0.9 % IV SOLN: 8.0000 mg | Freq: Three times a day (TID) | INTRAVENOUS | Status: DC | PRN

## 2021-06-27 MED ORDER — ENOXAPARIN SODIUM 300 MG/3ML IJ SOLN
20.0000 mg | INTRAMUSCULAR | Status: DC
  Filled 2021-06-27: qty 0.2

## 2021-06-27 MED ORDER — FAMOTIDINE 20 MG PO TABS: 20.0000 mg | ORAL_TABLET | Freq: Every day | ORAL | Status: DC

## 2021-06-27 MED ORDER — SODIUM CHLORIDE 0.9 % IV SOLN
6.2500 mg | Freq: Four times a day (QID) | INTRAVENOUS | Status: DC | PRN
  Administered 2021-06-27: 6.25 mg via INTRAVENOUS
  Filled 2021-06-27: qty 0.25

## 2021-06-27 NOTE — ED Triage Notes (Signed)
Generalized sickness, vomiting all day. Had abdominal mass 05/26/2021. Last po supper last pm.

## 2021-06-27 NOTE — ED Notes (Signed)
Pt 89% on RA. Pt placed on 2L [place] 

## 2021-06-27 NOTE — H&P (Addendum)
Kathryn Gates is an 88 y.o. female.   has a past medical history of Acoustic neuroma [facility]), Aortic regurgitation, Aortic stenosis, Carotid stenosis, Dyspnea, Generalized anxiety disorder, Headache, Hyperlipidemia, Hypertension, Hyponatremia, and TIA (transient ischemic attack).  Chief Complaint:  Chief Complaint  Patient presents with   Emesis    HPI:  Patient is alert but minimally communicative, not answering questions in a clear manner.  History obtained from chart review, ED provider.  Patient brought by primary caregiver, Olivia Mackie, who reported that since patient's surgery on 05/26/2021 for adnexal mass (pathology negative for malignancy) has been complaining of persistent diarrhea requiring OTC Imodium, has been in hospital several times in the past month for dehydration.  Noting decreased p.o. intake as of last night and today developing nausea, vomiting.  Olivia Mackie reported patient was vomiting bile, but not blood.  Patient states that her "belly hurts" and she is feeling nauseous, unable to obtain much else in terms of review of systems  Hospital course: 07/23/2021 in ED, patient continued to be vomiting, mildly tachycardic in the low 100s, diffuse abdominal tenderness but no rigidity/distention.  No SIRS/sepsis.  Given IV fluids, Zofran.  ED consulted with surgeon, Dr. Berline Lopes (gynecologic oncology) agreeing less likely that this is a postop issue.  Recommended CT, stool panel/C. difficile testing.  CT showed no acute abdominal/pelvic findings, likely postoperative small amount of free fluid, small bilateral pleural effusions.  CBC showed stable normocytic anemia, normal white blood cells.  CMP showed stable renal function, no AKI.  Lipase normal.Respiratory panel negative for COVID, flu.  Urinalysis showed no significant WBC, no nitrite, did show ketones and protein.  Stool specimen pending for GI panel and C. Difficile     Assessment/plan:  AMS, Tachycardia, Tachypnea, Hypoxia, unable to  swallow Concern for possible PE, but could not anticoagulate if a brain bleed (CVA potential cause of swallow dysfunction and altered mental status). Will send for CT Chest PE and CT head: transferring to [facility]. Recent CT/MRI/MRA after fall, some ischemic disease and old infarct but given new AMS will re-scan head, hold off on contrast studies of head. Per radiology ok to get additional dose contrast for CT Chest.   Abdominal pain, intractable nausea/vomiting, persistent diarrhea, poor p.o. intake.  Uncertain etiology, differential diagnosis includes viral gastritis/enteritis, bacterial enteritis such as C. Difficile.  CT Abd, Labs reassuring, very unlikely surgical complication Continue IV hydration Continue nausea management IV Obtain stool specimen If patient able to tolerate p.o., and remains otherwise stable hopefully can plan for discharge but may need to consider SNF  Hx labile blood pressure, hypertension/orthostatic hypotension:  Alternating between Elevated BP and WNL BP, holding home meds olmesartan (sig as needed blood pressure systolic over 676) and midodrine (sig bid)  GERD Home PPI, this is also a risk factor for CDiff, will switch to H2B for now  TIA Holding home Plavix given on VTE Ppx   VTE Ppx: lovenox d/c until CT head done, started SCD CODE: DNR per ED provider discussion w/ son - see notes Dispo: consider SNF but may be able to go home w/ [facility] Family discussion: spoke w/ son, Edd Arbour, to update re: scanning for PE/CVA and confirmed DNR.     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 Seen on 2D echo in 2013 (moderate) but not mentioned in 11/2013 echo.   Aortic stenosis    a. 2D Echo 11/2013: mild AS.   Carotid stenosis  a. Duplex 1-39% BICA in 11/2013.   Dyspnea    Generalized anxiety disorder    Headache    a. Possible tension HA versus migraine variant.   Hyperlipidemia    Hypertension    a. Labile BP.    Hyponatremia    TIA (transient ischemic attack)     Past Surgical History:  Procedure Laterality Date   CATARACT EXTRACTION     CESAREAN SECTION  1973   CHOLECYSTECTOMY     ROBOTIC ASSISTED SALPINGO OOPHERECTOMY Bilateral 05/26/2021   Procedure: XI ROBOTIC ASSISTED SALPINGO OOPHORECTOMY;  Surgeon: Lafonda Mosses, MD;  Location: [facility];  Service: Gynecology;  Laterality: Bilateral;   WRIST SURGERY  2007   Right    Family History  Problem Relation Age of Onset   Stroke Other    Heart failure Other        CHF   Social History:  reports that she has never smoked. She has never used smokeless tobacco. She reports that she does not drink alcohol and does not use drugs.  Allergies: No Known Allergies  (Not in a hospital admission)   Results for orders placed or performed during the hospital encounter of 07/14/2021 (from the past 48 hour(s))  Lipase, blood     Status: None   Collection Time: 07/06/2021 12:24 PM  Result Value Ref Range   Lipase 45 11 - 51 U/L    Comment: Performed at [facility], [address] [place], [place] [address]  Comprehensive metabolic panel     Status: Abnormal   Collection Time: 06/26/2021 12:24 PM  Result Value Ref Range   Sodium 138 135 - 145 mmol/L   Potassium 3.5 3.5 - 5.1 mmol/L   Chloride 101 98 - 111 mmol/L   CO2 21 (L) 22 - 32 mmol/L   Glucose, Bld 152 (H) 70 - 99 mg/dL    Comment: Glucose reference range applies only to samples taken after fasting for at least 8 hours.   BUN 14 8 - 23 mg/dL   Creatinine, Ser 0.99 0.44 - 1.00 mg/dL   Calcium 9.0 8.9 - 10.3 mg/dL   Total Protein 7.6 6.5 - 8.1 g/dL   Albumin 4.6 3.5 - 5.0 g/dL   AST 32 15 - 41 U/L   ALT 18 0 - 44 U/L   Alkaline Phosphatase 72 38 - 126 U/L   Total Bilirubin 1.2 0.3 - 1.2 mg/dL   GFR, Estimated 55 (L) >60 mL/min    Comment: (NOTE) Calculated using the CKD-EPI Creatinine Equation (2021)    Anion gap 16 (H) 5 - 15    Comment: Performed at [facility], [address],  [place], [place] [address]  CBC     Status: Abnormal   Collection Time: 07/17/2021 12:24 PM  Result Value Ref Range   WBC 7.1 4.0 - 10.5 K/uL   RBC 3.49 (L) 3.87 - 5.11 MIL/uL   Hemoglobin 10.7 (L) 12.0 - 15.0 g/dL   HCT 31.5 (L) 36.0 - 46.0 %   MCV 90.3 80.0 - 100.0 fL   MCH 30.7 26.0 - 34.0 pg   MCHC 34.0 30.0 - 36.0 g/dL   RDW 13.9 11.5 - 15.5 %   Platelets 295 150 - 400 K/uL   nRBC 0.0 0.0 - 0.2 %    Comment: Performed at [facility], [address], [place], [place] [address]  Resp Panel by RT-PCR (Flu A&B, Covid) Nasopharyngeal Swab     Status: None   Collection Time: 07/14/2021  3:14 PM   Specimen: Nasopharyngeal Swab; Nasopharyngeal(NP) swabs in vial transport medium  Result Value Ref Range   SARS Coronavirus 2 by RT PCR NEGATIVE NEGATIVE    Comment: (NOTE) SARS-CoV-2 target nucleic acids are NOT DETECTED.  The SARS-CoV-2 RNA is generally detectable in upper respiratory specimens during the acute phase of infection. The lowest concentration of SARS-CoV-2 viral copies this assay can detect is 138 copies/mL. A negative result does not preclude SARS-Cov-2 infection and should not be used as the sole basis for treatment or other patient management decisions. A negative result may occur with  improper specimen collection/handling, submission of specimen other than nasopharyngeal swab, presence of viral mutation(s) within the areas targeted by this assay, and inadequate number of viral copies(<138 copies/mL). A negative result must be combined with clinical observations, patient history, and epidemiological information. The expected result is Negative.  Fact Sheet for Patients:  EntrepreneurPulse.com.au  Fact Sheet for Healthcare Providers:  IncredibleEmployment.be  This test is no t yet approved or cleared by the [place] FDA and  has been authorized for detection and/or diagnosis of SARS-CoV-2 by FDA under an Emergency Use Authorization  (EUA). This EUA will remain  in effect (meaning this test can be used) for the duration of the COVID-19 declaration under Section 564(b)(1) of the Act, 21 U.S.C.section 360bbb-3(b)(1), unless the authorization is terminated  or revoked sooner.       Influenza A by PCR NEGATIVE NEGATIVE   Influenza B by PCR NEGATIVE NEGATIVE    Comment: (NOTE) The Xpert Xpress SARS-CoV-2/FLU/RSV plus assay is intended as an aid in the diagnosis of influenza from Nasopharyngeal swab specimens and should not be used as a sole basis for treatment. Nasal washings and aspirates are unacceptable for Xpert Xpress SARS-CoV-2/FLU/RSV testing.  Fact Sheet for Patients: EntrepreneurPulse.com.au  Fact Sheet for Healthcare Providers: IncredibleEmployment.be  This test is not yet approved or cleared by the [place] FDA and has been authorized for detection and/or diagnosis of SARS-CoV-2 by FDA under an Emergency Use Authorization (EUA). This EUA will remain in effect (meaning this test can be used) for the duration of the COVID-19 declaration under Section 564(b)(1) of the Act, 21 U.S.C. section 360bbb-3(b)(1), unless the authorization is terminated or revoked.  Performed at [facility], [address] [place], [place] [address]   Urinalysis, Routine w reflex microscopic Urine, In & Out Cath     Status: Abnormal   Collection Time: 07/15/2021  5:57 PM  Result Value Ref Range   Color, Urine YELLOW YELLOW   APPearance HAZY (A) CLEAR   Specific Gravity, Urine 1.031 (H) 1.005 - 1.030   pH 5.0 5.0 - 8.0   Glucose, UA NEGATIVE NEGATIVE mg/dL   Hgb urine dipstick MODERATE (A) NEGATIVE   Bilirubin Urine NEGATIVE NEGATIVE   Ketones, ur 20 (A) NEGATIVE mg/dL   Protein, ur 30 (A) NEGATIVE mg/dL   Nitrite NEGATIVE NEGATIVE   Leukocytes,Ua NEGATIVE NEGATIVE   RBC / HPF 0-5 0 - 5 RBC/hpf   WBC, UA 0-5 0 - 5 WBC/hpf   Bacteria, UA RARE (A) NONE SEEN   Squamous Epithelial /  LPF 0-5 0 - 5   Mucus PRESENT    Hyaline Casts, UA PRESENT    Granular Casts, UA PRESENT     Comment: Performed at [facility], [address] [place],  [address]   CT ABDOMEN PELVIS W CONTRAST  Result Date: 07/11/2021 CLINICAL DATA:  Nausea and vomiting. Abdominal pain. History of robotic assisted bilateral  salpingo-oophorectomies 05/26/2021. EXAM: CT ABDOMEN AND PELVIS WITH CONTRAST TECHNIQUE: Multidetector CT imaging of the abdomen and pelvis was performed using the standard protocol following bolus administration of intravenous contrast. CONTRAST:  14mL OMNIPAQUE IOHEXOL 350 MG/ML SOLN COMPARISON:  None available. FINDINGS: Lower chest: Mild cardiac enlargement with left ventricular configuration. No pericardial effusion. Normal caliber aorta. Stable aortic and coronary artery calcifications. Small bilateral pleural effusions with overlying bibasilar atelectasis. There is also underlying diffuse pulmonary scarring type changes. Hepatobiliary: No hepatic lesions or intrahepatic biliary dilatation. The gallbladder is surgically absent. No common bile duct dilatation. Pancreas: No mass, inflammation or ductal dilatation. Spleen: Normal size.  No focal lesions. Adrenals/Urinary Tract: The adrenal glands and kidneys are unremarkable. No renal lesions or evidence of pyelonephritis. Bladder is mildly distended but no bladder mass or calculi. Stomach/Bowel: The stomach, duodenum, small bowel and colon are unremarkable. There is a moderate to large duodenal diverticulum associated with the fourth portion the duodenum. There is advanced sigmoid colon diverticulosis but no findings for acute diverticulitis. Vascular/Lymphatic: Atherosclerotic calcifications involving the aorta, iliac arteries and branch vessels but no aneurysm or dissection. The major venous structures are patent. No mesenteric or retroperitoneal mass or adenopathy. Reproductive: The uterus is unremarkable. Both ovaries are surgically  absent. Other: There is fluid in the right pericolic gutter and also in the right pelvis along with a small amount of free fluid in the left adnexa region. This is likely liquified postoperative hematoma. I do not see any findings suspicious for a postoperative abscess or peritonitis. Musculoskeletal: No significant bony findings. IMPRESSION: 1. No acute abdominal/pelvic findings, mass lesions or adenopathy. 2. Small amount of free fluid, likely postoperative. No findings suspicious for abdominal/pelvic abscess. 3. Small bilateral pleural effusions with overlying bibasilar atelectasis. 4. Advanced sigmoid colon diverticulosis without findings for acute diverticulitis. 5. Status post cholecystectomy. No biliary dilatation. 6. Aortic atherosclerosis. Aortic Atherosclerosis (ICD10-I70.0). Electronically Signed   By: Marijo Sanes M.D.   On: 07/12/2021 16:26   DG Abdomen Acute W/Chest  Result Date: 07/11/2021 CLINICAL DATA:  Cough. Nausea, vomiting and diarrhea. Recent surgery. EXAM: DG ABDOMEN ACUTE WITH 1 VIEW CHEST COMPARISON:  None. FINDINGS: Normal bowel gas pattern.  No free air or air-fluid levels. Scattered aortic and iliac artery atherosclerotic calcifications. No evidence of renal or ureteral stones. Prior cholecystectomy. Cardiac silhouette borderline enlarged. No mediastinal or hilar masses. Prominent interstitial markings mostly in the lower lungs and lung hyperexpansion, stable. No evidence of pneumonia or pulmonary edema. Skeletal structures are demineralized, grossly intact. IMPRESSION: 1. No acute findings. No evidence of bowel obstruction, free air or adynamic ileus. 2. No acute cardiopulmonary disease. Electronically Signed   By: Lajean Manes M.D.   On: 07/06/2021 14:04    Review of Systems  Unable to perform ROS: Mental status change   Blood pressure 138/81, pulse (!) 106, temperature 98.4 F (36.9 C), temperature source Oral, resp. rate (!) 25, height 5' (1.524 m), weight 40.8 kg, SpO2 91  %. Physical Exam Constitutional:      General: She is not in acute distress.    Appearance: She is not toxic-appearing.  HENT:     Head: Normocephalic and atraumatic.  Eyes:     Extraocular Movements: Extraocular movements intact.     Conjunctiva/sclera: Conjunctivae normal.  Cardiovascular:     Rate and Rhythm: Regular rhythm. Tachycardia present.  Pulmonary:     Effort: Pulmonary effort is normal.     Breath sounds: Rales (very faint at bases) present. No wheezing or  rhonchi.  Abdominal:     General: There is no distension.     Palpations: There is no mass.     Tenderness: There is abdominal tenderness. There is guarding. There is no rebound.  Musculoskeletal:     Cervical back: Neck supple.     Right lower leg: No edema.     Left lower leg: No edema.  Skin:    General: Skin is warm and dry.     Coloration: Skin is pale.  Neurological:     Mental Status: She is alert. She is disoriented.  Psychiatric:        Behavior: Behavior normal.      Emeterio Reeve, DO 07/10/2021, 7:47 PM

## 2021-06-27 NOTE — ED Notes (Signed)
Pt unable to stand for orthostatics. Pt feeling dizzy and nauseous. PA aware.

## 2021-06-27 NOTE — ED Notes (Signed)
[facility] at bedside to transfer pt at this time.

## 2021-06-27 NOTE — ED Notes (Signed)
Kathryn Gates, caregiver presents with pt and reports new onset of confusion today. Diarrhea started yesterday at 1045am and vomiting started today.

## 2021-06-27 NOTE — ED Notes (Signed)
Upon return from CT pt O2 sats 84% on 2L [place]. Pt placed on 5L [place]. O2 sats 90%. PA notified.

## 2021-06-27 NOTE — ED Notes (Signed)
PA aware about vital signs.

## 2021-06-27 NOTE — ED Provider Notes (Signed)
[facility] EMERGENCY DEPARTMENT Provider Note   CSN: [id number] Arrival date & time: 07/10/2021  1159     History  Chief Complaint  Patient presents with   Emesis    Pershing is a 88 y.o. female presenting for evaluation nausea, vomiting, diarrhea.  History provided mostly by patient's caregiver, Olivia Mackie.  Olivia Mackie states patient had surgery on 12-1, during which an adnexal mass was removed.  Since then, patient has had issues with diarrhea, needing Imodium to help with management.  However she has been to the hospital several times in the past month for dehydration.  Last night her appetite was poor, and today she has had nausea and vomiting, vomiting bile.  No blood.  No known fevers.  No chest pain, shortness of breath, cough, urinary symptoms.  No blood in her stool.  No sick contacts.  HPI     Home Medications Prior to Admission medications   Medication Sig Start Date End Date Taking? Authorizing Provider  acetaminophen (TYLENOL) 325 MG tablet Take 650 mg by mouth every 6 (six) hours as needed for moderate pain.    [provider]  clopidogrel (PLAVIX) 75 MG tablet Take 1 tablet (75 mg total) by mouth daily. 06/18/21 06/18/22  [name], MD  ezetimibe (ZETIA) 10 MG tablet Take 1 tablet (10 mg total) by mouth daily. Patient not taking: Reported on 06/23/2021 09/26/19   Verta Ellen., NP  meclizine (ANTIVERT) 12.5 MG tablet Take 1 tablet (12.5 mg total) by mouth 3 (three) times daily as needed for dizziness. 06/18/21   [name], MD  midodrine (PROAMATINE) 2.5 MG tablet Take 1 tablet (2.5 mg total) by mouth in the morning and at bedtime. 06/24/21   Arnoldo Lenis, MD  olmesartan (BENICAR) 5 MG tablet Take 5 mg by mouth daily as needed (blood pressure over 150). 12/30/19   [provider]  polyvinyl alcohol (LIQUIFILM TEARS) 1.4 % ophthalmic solution Place 1 drop into both eyes daily as needed for dry eyes.    [provider]  RABEprazole  (ACIPHEX) 20 MG tablet Take 1 tablet (20 mg total) by mouth in the morning and at bedtime. 06/18/21   [name], MD  senna-docusate (SENOKOT-S) 8.6-50 MG tablet Take 2 tablets by mouth at bedtime. For AFTER surgery, do not take if having diarrhea Patient not taking: Reported on 06/23/2021 05/02/21   Joylene John D, NP      Allergies    Patient has no known allergies.    Review of Systems   Review of Systems  Gastrointestinal:  Positive for abdominal pain, diarrhea, nausea and vomiting.  All other systems reviewed and are negative.  Physical Exam Updated Vital Signs BP (!) 151/76    Pulse (!) 106    Temp 98.4 F (36.9 C) (Oral)    Resp (!) 31    Ht 5' (1.524 m)    Wt 40.8 kg    SpO2 97%    BMI 17.57 kg/m  Physical Exam Vitals and nursing note reviewed.  Constitutional:      General: She is not in acute distress.    Appearance: Normal appearance.     Comments: Appears tired, but not lethargic. nontoxic  HENT:     Head: Normocephalic and atraumatic.  Eyes:     Conjunctiva/sclera: Conjunctivae normal.     Pupils: Pupils are equal, round, and reactive to light.  Cardiovascular:     Rate and Rhythm: Regular rhythm. Tachycardia present.  Pulses: Normal pulses.     Comments: Mildly tachycardic in the low 100s Pulmonary:     Effort: Pulmonary effort is normal. No respiratory distress.     Breath sounds: Normal breath sounds. No wheezing.     Comments: Speaking in full sentences.  Clear lung sounds in all fields. Abdominal:     General: There is no distension.     Palpations: Abdomen is soft. There is no mass.     Tenderness: There is abdominal tenderness. There is no guarding or rebound.     Comments: Diffuse ttp of the abd without focal pain. No rigidity or distention  Musculoskeletal:        General: Normal range of motion.     Cervical back: Normal range of motion and neck supple.  Skin:    General: Skin is warm and dry.     Capillary Refill: Capillary refill takes  less than 2 seconds.  Neurological:     Mental Status: She is alert and oriented to person, place, and time.  Psychiatric:        Mood and Affect: Mood and affect normal.        Speech: Speech normal.        Behavior: Behavior normal.    ED Results / Procedures / Treatments   Labs (all labs ordered are listed, but only abnormal results are displayed) Labs Reviewed  COMPREHENSIVE METABOLIC PANEL - Abnormal; Notable for the following components:      Result Value   CO2 21 (*)    Glucose, Bld 152 (*)    GFR, Estimated 55 (*)    Anion gap 16 (*)    All other components within normal limits  CBC - Abnormal; Notable for the following components:   RBC 3.49 (*)    Hemoglobin 10.7 (*)    HCT 31.5 (*)    All other components within normal limits  URINALYSIS, ROUTINE W REFLEX MICROSCOPIC - Abnormal; Notable for the following components:   APPearance HAZY (*)    Specific Gravity, Urine 1.031 (*)    Hgb urine dipstick MODERATE (*)    Ketones, ur 20 (*)    Protein, ur 30 (*)    Bacteria, UA RARE (*)    All other components within normal limits  RESP PANEL BY RT-PCR (FLU A&B, COVID) ARPGX2  C DIFFICILE QUICK SCREEN W PCR REFLEX    GASTROINTESTINAL PANEL BY PCR, STOOL (REPLACES STOOL CULTURE)  LIPASE, BLOOD  BASIC METABOLIC PANEL  CBC    EKG None  Radiology CT ABDOMEN PELVIS W CONTRAST  Result Date: 07/01/2021 CLINICAL DATA:  Nausea and vomiting. Abdominal pain. History of robotic assisted bilateral salpingo-oophorectomies 05/26/2021. EXAM: CT ABDOMEN AND PELVIS WITH CONTRAST TECHNIQUE: Multidetector CT imaging of the abdomen and pelvis was performed using the standard protocol following bolus administration of intravenous contrast. CONTRAST:  44mL OMNIPAQUE IOHEXOL 350 MG/ML SOLN COMPARISON:  None available. FINDINGS: Lower chest: Mild cardiac enlargement with left ventricular configuration. No pericardial effusion. Normal caliber aorta. Stable aortic and coronary artery  calcifications. Small bilateral pleural effusions with overlying bibasilar atelectasis. There is also underlying diffuse pulmonary scarring type changes. Hepatobiliary: No hepatic lesions or intrahepatic biliary dilatation. The gallbladder is surgically absent. No common bile duct dilatation. Pancreas: No mass, inflammation or ductal dilatation. Spleen: Normal size.  No focal lesions. Adrenals/Urinary Tract: The adrenal glands and kidneys are unremarkable. No renal lesions or evidence of pyelonephritis. Bladder is mildly distended but no bladder mass or calculi. Stomach/Bowel: The stomach,  duodenum, small bowel and colon are unremarkable. There is a moderate to large duodenal diverticulum associated with the fourth portion the duodenum. There is advanced sigmoid colon diverticulosis but no findings for acute diverticulitis. Vascular/Lymphatic: Atherosclerotic calcifications involving the aorta, iliac arteries and branch vessels but no aneurysm or dissection. The major venous structures are patent. No mesenteric or retroperitoneal mass or adenopathy. Reproductive: The uterus is unremarkable. Both ovaries are surgically absent. Other: There is fluid in the right pericolic gutter and also in the right pelvis along with a small amount of free fluid in the left adnexa region. This is likely liquified postoperative hematoma. I do not see any findings suspicious for a postoperative abscess or peritonitis. Musculoskeletal: No significant bony findings. IMPRESSION: 1. No acute abdominal/pelvic findings, mass lesions or adenopathy. 2. Small amount of free fluid, likely postoperative. No findings suspicious for abdominal/pelvic abscess. 3. Small bilateral pleural effusions with overlying bibasilar atelectasis. 4. Advanced sigmoid colon diverticulosis without findings for acute diverticulitis. 5. Status post cholecystectomy. No biliary dilatation. 6. Aortic atherosclerosis. Aortic Atherosclerosis (ICD10-I70.0). Electronically  Signed   By: Marijo Sanes M.D.   On: 07/09/2021 16:26   DG Abdomen Acute W/Chest  Result Date: 06/29/2021 CLINICAL DATA:  Cough. Nausea, vomiting and diarrhea. Recent surgery. EXAM: DG ABDOMEN ACUTE WITH 1 VIEW CHEST COMPARISON:  None. FINDINGS: Normal bowel gas pattern.  No free air or air-fluid levels. Scattered aortic and iliac artery atherosclerotic calcifications. No evidence of renal or ureteral stones. Prior cholecystectomy. Cardiac silhouette borderline enlarged. No mediastinal or hilar masses. Prominent interstitial markings mostly in the lower lungs and lung hyperexpansion, stable. No evidence of pneumonia or pulmonary edema. Skeletal structures are demineralized, grossly intact. IMPRESSION: 1. No acute findings. No evidence of bowel obstruction, free air or adynamic ileus. 2. No acute cardiopulmonary disease. Electronically Signed   By: Lajean Manes M.D.   On: 07/25/2021 14:04    Procedures Procedures    Medications Ordered in ED Medications  promethazine (PHENERGAN) 6.25 mg in sodium chloride 0.9 % 50 mL IVPB (0 mg Intravenous Stopped 07/06/2021 1509)  enoxaparin (Lovenox) 20 mg / 0.2 ml  injection  (has no administration in time range)  polyvinyl alcohol (LIQUIFILM TEARS) 1.4 % ophthalmic solution 1 drop (has no administration in time range)  famotidine (PEPCID) tablet 20 mg (20 mg Oral Not Given 07/17/2021 2125)  sodium chloride flush (NS) 0.9 % injection 3 mL (3 mLs Intravenous Given 07/12/2021 1219)  ondansetron (ZOFRAN) injection 4 mg (4 mg Intravenous Given 07/13/2021 1219)  0.9 %  sodium chloride infusion (0 mLs Intravenous Stopped 07/07/2021 1420)  iohexol (OMNIPAQUE) 350 MG/ML injection 80 mL (80 mLs Intravenous Contrast Given 07/09/2021 1605)  LORazepam (ATIVAN) injection 0.5 mg (0.5 mg Intravenous Given 07/07/2021 1809)  lactated ringers bolus 1,000 mL (1,000 mLs Intravenous New Bag/Given 07/06/2021 2147)    ED Course/ Medical Decision Making/ A&P                           Medical Decision  Making  Patient presenting for evaluation of nausea, vomiting, diarrhea.  On exam, patient appears tired, but not lethargic.  She does have diffuse tenderness palpation the abdomen.  Heart rate is in the low 100s, this may be due to dehydration.  Consider viral illness, gastritis, bacterial intestinal illness, bowel obstruction.  Less likely postop infection considering the length of time since surgery.  Labs obtained from triage interpreted by me, overall reassuring.  Kidney function normal and  BUN is normal.  Will add on chest x-ray and abdominal x-ray.  We will add on COVID test, urine, and stool panels.  Will consult with surgeon.  Discussed with Dr. Berline Lopes from gynecology oncology who agrees that this is less likely a postop issue.  However agrees patient needs a CT and is requesting stool panels/C. difficile testing.  Patient unable to stand due to feeling dizzy, nauseous, and lightheaded.  Fluids given.   Unfortunately there is no CT scanner available at the facility at this time.  As such, patient be transferred to [facility] long hospital for CT scan and then return to the [facility] emergency department.  Dr. Jeanell Sparrow is the accepting provider.  Discussed plan with patient and caregiver, who are agreeable.   CT scan without acute intra-abdominal normalities.  Does show small bilateral pleural effusions.  On patient's return to the [facility], ER, she is more tachycardic, tachypneic, and hypoxic than when she left.  She continues to have nausea and vomiting, breathing shallowly likely due to nausea.  Due to worsened vital signs despite symptomatic management, patient will need to be admitted for nausea and vomiting control as well as monitoring of her symptoms.  Discussed with Olivia Mackie, patient's caregiver, who is agreeable.   7:18 PM Discussed patient's condition and plan with Elray Mcgregor, pt's son ([phone]). He states that he and his mom (per their previous conversations) would like pt to be a  DNR. He confirms no CPR and no intubation. They would like all other noninvasive measures.   Discussed with Dr. Sheppard Coil from [facility], patient to be admitted.  Final Clinical Impression(s) / ED Diagnoses Final diagnoses:  Nausea vomiting and diarrhea  Hypoxia  Dehydration    Rx / DC Orders ED Discharge Orders     None         Calliope Delangel, PA-C 45/80/99 [id number]    Lianne Cure, DO 25/05/39 1638

## 2021-06-27 NOTE — ED Notes (Signed)
Son, [place] given update.

## 2021-06-27 NOTE — ED Notes (Signed)
Pt vomited on self. This nurse changed pt gown, and linen. Fresh linen and warm blankets given.

## 2021-06-28 ENCOUNTER — Inpatient Hospital Stay (HOSPITAL_COMMUNITY): Payer: Medicare Other

## 2021-06-28 DIAGNOSIS — J810 Acute pulmonary edema: Secondary | ICD-10-CM | POA: Diagnosis not present

## 2021-06-28 DIAGNOSIS — R112 Nausea with vomiting, unspecified: Secondary | ICD-10-CM | POA: Diagnosis not present

## 2021-06-28 DIAGNOSIS — J9600 Acute respiratory failure, unspecified whether with hypoxia or hypercapnia: Secondary | ICD-10-CM | POA: Diagnosis present

## 2021-06-28 DIAGNOSIS — J9601 Acute respiratory failure with hypoxia: Secondary | ICD-10-CM | POA: Diagnosis not present

## 2021-06-28 LAB — CBC
HCT: 39.3 % (ref 36.0–46.0)
Hemoglobin: 12.8 g/dL (ref 12.0–15.0)
MCH: 30.3 pg (ref 26.0–34.0)
MCHC: 32.6 g/dL (ref 30.0–36.0)
MCV: 93.1 fL (ref 80.0–100.0)
Platelets: 334 10*3/uL (ref 150–400)
RBC: 4.22 MIL/uL (ref 3.87–5.11)
RDW: 14.3 % (ref 11.5–15.5)
WBC: 13.7 10*3/uL — ABNORMAL HIGH (ref 4.0–10.5)
nRBC: 0 % (ref 0.0–0.2)

## 2021-06-28 LAB — BASIC METABOLIC PANEL
Anion gap: 17 — ABNORMAL HIGH (ref 5–15)
BUN: 14 mg/dL (ref 8–23)
CO2: 19 mmol/L — ABNORMAL LOW (ref 22–32)
Calcium: 9 mg/dL (ref 8.9–10.3)
Chloride: 104 mmol/L (ref 98–111)
Creatinine, Ser: 1.04 mg/dL — ABNORMAL HIGH (ref 0.44–1.00)
GFR, Estimated: 51 mL/min — ABNORMAL LOW (ref >60)
Glucose, Bld: 191 mg/dL — ABNORMAL HIGH (ref 70–99)
Potassium: 3.4 mmol/L — ABNORMAL LOW (ref 3.5–5.1)
Sodium: 140 mmol/L (ref 135–145)

## 2021-06-28 LAB — BLOOD GAS, ARTERIAL
Acid-base deficit: 3.9 mmol/L — ABNORMAL HIGH (ref 0.0–2.0)
Bicarbonate: 21.1 mmol/L (ref 20.0–28.0)
Drawn by: 22223
FIO2: 100
O2 Saturation: 83.5 %
Patient temperature: 37
pCO2 arterial: 35.4 mmHg (ref 32.0–48.0)
pH, Arterial: 7.378 (ref 7.350–7.450)
pO2, Arterial: 50.8 mmHg — ABNORMAL LOW (ref 83.0–108.0)

## 2021-06-28 LAB — D-DIMER, QUANTITATIVE: D-Dimer, Quant: 6.52 ug/mL-FEU — ABNORMAL HIGH (ref 0.00–0.50)

## 2021-06-28 LAB — MRSA NEXT GEN BY PCR, NASAL: MRSA by PCR Next Gen: NOT DETECTED

## 2021-06-28 MED ORDER — SODIUM CHLORIDE 0.9 % IV BOLUS
1000.0000 mL | Freq: Once | INTRAVENOUS | Status: AC
  Administered 2021-06-28: 1000 mL via INTRAVENOUS

## 2021-06-28 MED ORDER — MORPHINE SULFATE (PF) 4 MG/ML IV SOLN
4.0000 mg | Freq: Once | INTRAVENOUS | Status: AC
  Administered 2021-06-28: 4 mg via INTRAVENOUS
  Filled 2021-06-28: qty 1

## 2021-06-28 MED ORDER — MORPHINE SULFATE (PF) 2 MG/ML IV SOLN: 2.0000 mg | INTRAVENOUS | Status: DC | PRN

## 2021-06-28 MED ORDER — CHLORHEXIDINE GLUCONATE CLOTH 2 % EX PADS
6.0000 | MEDICATED_PAD | Freq: Every day | CUTANEOUS | Status: DC
  Administered 2021-06-28 – 2021-06-29 (×2): 6 via TOPICAL

## 2021-06-28 MED ORDER — HEPARIN (PORCINE) 25000 UT/250ML-% IV SOLN
650.0000 [IU]/h | INTRAVENOUS | Status: DC
  Administered 2021-06-28: 650 [IU]/h via INTRAVENOUS
  Filled 2021-06-28: qty 250

## 2021-06-28 MED ORDER — GLYCOPYRROLATE 0.2 MG/ML IJ SOLN
0.1000 mg | Freq: Four times a day (QID) | INTRAMUSCULAR | Status: DC | PRN
  Administered 2021-06-28: 0.1 mg via INTRAVENOUS
  Filled 2021-06-28: qty 1

## 2021-06-28 MED ORDER — ORAL CARE MOUTH RINSE
15.0000 mL | Freq: Two times a day (BID) | OROMUCOSAL | Status: DC
  Administered 2021-06-28 – 2021-06-30 (×4): 15 mL via OROMUCOSAL

## 2021-06-28 MED ORDER — LORAZEPAM 2 MG/ML IJ SOLN: 1.0000 mg | INTRAMUSCULAR | Status: DC | PRN

## 2021-06-28 MED ORDER — METOPROLOL TARTRATE 5 MG/5ML IV SOLN
10.0000 mg | Freq: Once | INTRAVENOUS | Status: AC
  Administered 2021-06-28: 10 mg via INTRAVENOUS
  Filled 2021-06-28: qty 10

## 2021-06-28 MED ORDER — FUROSEMIDE 10 MG/ML IJ SOLN
40.0000 mg | Freq: Once | INTRAMUSCULAR | Status: AC
  Administered 2021-06-28: 40 mg via INTRAVENOUS
  Filled 2021-06-28: qty 4

## 2021-06-28 MED ORDER — HEPARIN BOLUS VIA INFUSION
2500.0000 [IU] | Freq: Once | INTRAVENOUS | Status: AC
Start: 2021-06-28 — End: 2021-06-28
  Administered 2021-06-28: 2500 [IU] via INTRAVENOUS

## 2021-06-28 MED ORDER — METOPROLOL TARTRATE 5 MG/5ML IV SOLN
5.0000 mg | Freq: Four times a day (QID) | INTRAVENOUS | Status: DC
  Administered 2021-06-28 – 2021-06-29 (×4): 5 mg via INTRAVENOUS
  Filled 2021-06-28 (×4): qty 5

## 2021-06-28 MED ORDER — SODIUM CHLORIDE 0.9 % IV SOLN: INTRAVENOUS | Status: DC

## 2021-06-28 NOTE — Progress Notes (Signed)
MYSTIC LABO  YKZ:[id number] DOB: January 04, 1930 DOA: 07/25/2021 PCP: Lafonda Mosses, MD    Brief Narrative:  88 year old with a history of dementia, acoustic neuroma, mod/severe AoS, anxiety, HLD, HTN, BSO 05/26/2021, and TIA who was brought to the ED by her family for ongoing severe diarrhea with marked decreased oral intake as well as nausea and vomiting x24+ hours.  Due to her dementia this patient was not able to provide history but the family did not feel that she was indicating she had abdominal pain.  In the ED CT abdomen revealed no evidence of bowel obstruction and no other acute abdominal findings.  WBC was normal.  There was no evidence of acute kidney disease.  Lipase was normal.  The patient was COVID and flu negative.  UA was not suggestive of urinary tract infection.  During the course of her ED stay she has developed severe tachycardia, tachypnea, and hypoxia.  This raise concern for PE but given her altered mental status empiric anticoagulation was not felt to be safe due to the possibility of intracranial hemorrhage.  CT scans of the head and chest PE protocol are currently pending.  The scans have been delayed as the CT scanner at [facility] was down.   Consultants:  None  Code Status: FULL CODE  Antimicrobials:  None  DVT prophylaxis: SCDs  Interim Hx: I was alerted to the patient's unstable state shortly after arriving for my shift.  I reviewed her history and presented to the ED.  I found the patient in the hospital stretcher in the ED room with respiratory rate 35-40 and heart rate 135.  She remarkably appeared to be reasonably comfortable at the time.  When stimulated she becomes more tachypneic and appears agitated.  She does not open her eyes or follow simple commands.  Physical exam reveals coarse upper airway sounds but good air movement appreciable bilaterally.  Her heart rate is regular but very tachycardic.  Abdomen is tender to touch diffusely but soft and not  distended.  There is only trace edema bilateral lower extremities.  Carotid massage was attempted at bedside with heart rate slowing to approximately 128 transiently but not in a sustained fashion.  After seeing the patient I placed orders for Lopressor 10 mg IV x1 and a normal saline 1 L bolus.  I called and spoke with the patient's POA/son.  I explained her grave current condition and my fear that she would not likely survive.  He confirmed and continues to agree with her DNR status but does wish to continue active treatment short of that for now.  The 2 highest elements on my differential at this time are aspiration pneumonitis versus large PE.  I have canceled plans to move her to [facility] for CT imaging as the [facility], CT scanner is now on line again and the patient is also clearly not stable for transportation.  She will be dosed with as needed morphine to aid in her comfort while we continue active medical care.   Assessment & Plan:  Acute hypoxic respiratory failure -tachypnea Requiring 100% nonrebreather to keep sats in the mid 80s -elevated D-dimer concerning for significant PE - aspiration is also a risk given clinical presentation   Sinus tachycardia Large PE is on the differential presently given severe tachycardia and acute hypoxic respiratory failure - stat CT head pending to rule out intracranial bleed -if CT head unrevealing will empirically initiate anticoagulation as I do not wish to give her  IV contrast when she appears to be significantly dehydrated and also in distress (I wish to minimize the time she is off the floor) -should she prove to have a large PE I do not think lytics would be appropriate given her recent surgical procedure  Moderate to severe AOS mean grad 31, AVA VTI 0.90 per TTE October 2022  Nausea vomiting diarrhea No evidence of bowel obstruction on CT  Normocytic anemia Hemoglobin stable  Chronic orthostatic hypotension Patient is routinely on  midodrine twice daily and as needed Benicar as she tends to have episodes of reflex hypertension  GERD Continue PPI  HLD Resume Zetia when able to tolerate diet   Family Communication: Spoke with son over phone as discussed above Disposition: Admit to stepdown bed -continue aggressive medical care but expect hospital death  Objective: Blood pressure (!) 149/85, pulse (!) 130, temperature 98.4 F (36.9 C), temperature source Oral, resp. rate (!) 31, height 5' (1.524 m), weight 40.8 kg, SpO2 98 %.  Intake/Output Summary (Last 24 hours) at 06/28/2021 0757 Last data filed at 06/28/2021 0129 Gross per 24 hour  Intake 2050 ml  Output --  Net 2050 ml   Filed Weights   07/20/2021 1206  Weight: 40.8 kg    Examination: General: Tachypneic and markedly tachycardic and clear distress Lungs: Coarse upper airway crackles transmitted throughout with good breath sounds bilaterally and no wheezing Cardiovascular: Tachycardic at 140 bpm but regular without murmur Abdomen: Tender diffusely, thin, nondistended, no mass, no rebound Extremities: Trace edema bilateral lower extremities with no clear calf swelling and no apparent tenderness on palpation of calves bilaterally Neuro: Responds to painful stimuli with withdrawal but does not open her eyes or interact, nonverbal, no spontaneous movement of extremities either side, no obvious cranial nerve deficits without cooperation  CBC: Recent Labs  Lab 06/23/21 1145 07/15/2021 1224 06/28/21 0344  WBC 5.5 7.1 13.7*  NEUTROABS 4.2  --   --   HGB 10.3* 10.7* 12.8  HCT 30.3* 31.5* 39.3  MCV 89.6 90.3 93.1  PLT 318 295 741   Basic Metabolic Panel: Recent Labs  Lab 06/23/21 1145 06/28/2021 1224 06/28/21 0344  NA 130* 138 140  K 3.5 3.5 3.4*  CL 95* 101 104  CO2 26 21* 19*  GLUCOSE 136* 152* 191*  BUN 17 14 14   CREATININE 1.23* 0.99 1.04*  CALCIUM 8.9 9.0 9.0   GFR: Estimated Creatinine Clearance: 23.6 mL/min (A) (by C-G formula based on SCr  of 1.04 mg/dL (H)).  Liver Function Tests: Recent Labs  Lab 06/23/21 1145 07/26/2021 1224  AST 15 32  ALT 9 18  ALKPHOS 61 72  BILITOT 0.5 1.2  PROT 6.4* 7.6  ALBUMIN 3.9 4.6   Recent Labs  Lab 07/13/2021 1224  LIPASE 45   HbA1C: Hgb A1c MFr Bld  Date/Time Value Ref Range Status  06/18/2021 04:57 AM 5.0 4.8 - 5.6 % Final    Comment:    (NOTE) Pre diabetes:          5.7%-6.4%  Diabetes:              >6.4%  Glycemic control for   <7.0% adults with diabetes   12/12/2012 08:40 AM 5.4 <5.7 % Final    Comment:    (NOTE)  According to the ADA Clinical Practice Recommendations for 2011, when HbA1c is used as a screening test:  >=6.5%   Diagnostic of Diabetes Mellitus           (if abnormal result is confirmed) 5.7-6.4%   Increased risk of developing Diabetes Mellitus References:Diagnosis and Classification of Diabetes Mellitus,Diabetes IONG,2952,84(XLKGM 1):S62-S69 and Standards of Medical Care in         Diabetes - 2011,Diabetes WNUU,7253,66 (Suppl 1):S11-S61.    Recent Results (from the past 240 hour(s))  Resp Panel by RT-PCR (Flu A&B, Covid) Nasopharyngeal Swab     Status: None   Collection Time: 07/13/2021  3:14 PM   Specimen: Nasopharyngeal Swab; Nasopharyngeal(NP) swabs in vial transport medium  Result Value Ref Range Status   SARS Coronavirus 2 by RT PCR NEGATIVE NEGATIVE Final    Comment: (NOTE) SARS-CoV-2 target nucleic acids are NOT DETECTED.  The SARS-CoV-2 RNA is generally detectable in upper respiratory specimens during the acute phase of infection. The lowest concentration of SARS-CoV-2 viral copies this assay can detect is 138 copies/mL. A negative result does not preclude SARS-Cov-2 infection and should not be used as the sole basis for treatment or other patient management decisions. A negative result may occur with  improper specimen collection/handling, submission of specimen  other than nasopharyngeal swab, presence of viral mutation(s) within the areas targeted by this assay, and inadequate number of viral copies(<138 copies/mL). A negative result must be combined with clinical observations, patient history, and epidemiological information. The expected result is Negative.  Fact Sheet for Patients:  EntrepreneurPulse.com.au  Fact Sheet for Healthcare Providers:  IncredibleEmployment.be  This test is no t yet approved or cleared by the [place] FDA and  has been authorized for detection and/or diagnosis of SARS-CoV-2 by FDA under an Emergency Use Authorization (EUA). This EUA will remain  in effect (meaning this test can be used) for the duration of the COVID-19 declaration under Section 564(b)(1) of the Act, 21 U.S.C.section 360bbb-3(b)(1), unless the authorization is terminated  or revoked sooner.       Influenza A by PCR NEGATIVE NEGATIVE Final   Influenza B by PCR NEGATIVE NEGATIVE Final    Comment: (NOTE) The Xpert Xpress SARS-CoV-2/FLU/RSV plus assay is intended as an aid in the diagnosis of influenza from Nasopharyngeal swab specimens and should not be used as a sole basis for treatment. Nasal washings and aspirates are unacceptable for Xpert Xpress SARS-CoV-2/FLU/RSV testing.  Fact Sheet for Patients: EntrepreneurPulse.com.au  Fact Sheet for Healthcare Providers: IncredibleEmployment.be  This test is not yet approved or cleared by the [place] FDA and has been authorized for detection and/or diagnosis of SARS-CoV-2 by FDA under an Emergency Use Authorization (EUA). This EUA will remain in effect (meaning this test can be used) for the duration of the COVID-19 declaration under Section 564(b)(1) of the Act, 21 U.S.C. section 360bbb-3(b)(1), unless the authorization is terminated or revoked.  Performed at [facility], [address] [place], [place]  [address]      Scheduled Meds:  famotidine  20 mg Oral Daily   Continuous Infusions:  promethazine (PHENERGAN) injection (IM or IVPB) Stopped (07/09/2021 1509)     LOS: 1 day   Cherene Altes, MD [facility] Office  [phone] Pager - Text Page per Amion  If 7PM-7AM, please contact night-coverage per Amion 06/28/2021, 7:57 AM

## 2021-06-28 NOTE — ED Notes (Signed)
MD contacted for patient BP and oxygen after moving from NRB to [place] 6L. Per MD, if O2 drops below 86%, move to NRB. Continue with fluid boluses for BP.

## 2021-06-28 NOTE — Progress Notes (Signed)
ANTICOAGULATION CONSULT NOTE - Initial Consult  Pharmacy Consult for heparin Indication: pulmonary embolus  No Known Allergies  Patient Measurements: Height: 5' (152.4 cm) Weight: 40.8 kg (89 lb 15.2 oz) IBW/kg (Calculated) : 45.5 Heparin Dosing Weight: 40.8 kg  Vital Signs: BP: 78/52 (01/03 0930) Pulse Rate: 84 (01/03 0930)  Labs: Recent Labs    07/07/2021 1224 06/28/21 0344  HGB 10.7* 12.8  HCT 31.5* 39.3  PLT 295 334  CREATININE 0.99 1.04*    Estimated Creatinine Clearance: 23.6 mL/min (A) (by C-G formula based on SCr of 1.04 mg/dL (H)).   Medical History: Past Medical History:  Diagnosis Date   Acoustic neuroma [facility])    Aortic regurgitation    a. Seen on 2D echo in 2013 (moderate) but not mentioned in 11/2013 echo.   Aortic stenosis    a. 2D Echo 11/2013: mild AS.   Carotid stenosis    a. Duplex 1-39% BICA in 11/2013.   Dyspnea    Generalized anxiety disorder    Headache    a. Possible tension HA versus migraine variant.   Hyperlipidemia    Hypertension    a. Labile BP.   Hyponatremia    TIA (transient ischemic attack)     Medications:  (Not in a hospital admission)   Assessment: Pharmacy consulted to dose heparin in patient with presumed PE.  Patient is not on anticoagulation prior to admission.  D-Dimer 6.52 CBC WNL  Goal of Therapy:  Heparin level 0.3-0.7 units/ml Monitor platelets by anticoagulation protocol: Yes   Plan:  Give 2500 units bolus x 1 Start heparin infusion at 650 units/hr Check anti-Xa level in 8 hours and daily while on heparin Continue to monitor H&H and platelets  Margot Ables, PharmD Clinical Pharmacist 06/28/2021 9:53 AM

## 2021-06-28 NOTE — ED Notes (Signed)
Pt has grossly soiled diaper and linens. Cleaned pt, placed on purewick and lines changed.

## 2021-06-28 NOTE — ED Notes (Signed)
McClung, MD reached out to change patient bed to AP bed. Also mentioned to MD of patient status, WBC, and pO2. MD  plans to review chart and see patient.

## 2021-06-28 NOTE — ED Notes (Signed)
Notified Dr Josephine Cables of pt's HR 130's -150's, O2 sats of 86% on 100% nrb, and RR in 30's. New orders given and carried out accordingly.

## 2021-06-28 NOTE — Progress Notes (Addendum)
RN called due to patient being tachycardic, tachypneic and hypoxic, she was admitted with these findings.  There was concern for possible PE and intracranial bleed, so anticoagulant could not be started prophylactically.  D-dimer done was elevated at 6.52, WBC is increased (possibly due to reactive process).  Chest x-ray was done and showed acute bilateral pulmonary opacity most likely pulmonary edema with small bilateral pleural effusions. Bilateral pneumonia felt less likely.  ABG done showed hypoxia. IV Lasix 40 mg x 1 was given.  Continue supplemental oxygen to maintain O2 sat greater than 92%. Patient is awaiting transportation to [facility].  Total time:  17 minutes This includes time reviewing the chart including progress notes, labs, EKGs, taking medical decisions, ordering labs and documenting findings.

## 2021-06-29 ENCOUNTER — Inpatient Hospital Stay (HOSPITAL_COMMUNITY): Payer: Medicare Other

## 2021-06-29 ENCOUNTER — Encounter (HOSPITAL_COMMUNITY): Payer: Self-pay | Admitting: Internal Medicine

## 2021-06-29 DIAGNOSIS — Z515 Encounter for palliative care: Secondary | ICD-10-CM

## 2021-06-29 DIAGNOSIS — I472 Ventricular tachycardia, unspecified: Secondary | ICD-10-CM

## 2021-06-29 DIAGNOSIS — Z7189 Other specified counseling: Secondary | ICD-10-CM | POA: Diagnosis not present

## 2021-06-29 DIAGNOSIS — R570 Cardiogenic shock: Secondary | ICD-10-CM | POA: Diagnosis not present

## 2021-06-29 DIAGNOSIS — J9601 Acute respiratory failure with hypoxia: Secondary | ICD-10-CM | POA: Diagnosis not present

## 2021-06-29 DIAGNOSIS — R112 Nausea with vomiting, unspecified: Secondary | ICD-10-CM | POA: Diagnosis not present

## 2021-06-29 LAB — COMPREHENSIVE METABOLIC PANEL
ALT: 56 U/L — ABNORMAL HIGH (ref 0–44)
AST: 54 U/L — ABNORMAL HIGH (ref 15–41)
Albumin: 3.3 g/dL — ABNORMAL LOW (ref 3.5–5.0)
Alkaline Phosphatase: 92 U/L (ref 38–126)
Anion gap: 11 (ref 5–15)
BUN: 24 mg/dL — ABNORMAL HIGH (ref 8–23)
CO2: 22 mmol/L (ref 22–32)
Calcium: 8.1 mg/dL — ABNORMAL LOW (ref 8.9–10.3)
Chloride: 112 mmol/L — ABNORMAL HIGH (ref 98–111)
Creatinine, Ser: 1.34 mg/dL — ABNORMAL HIGH (ref 0.44–1.00)
GFR, Estimated: 38 mL/min — ABNORMAL LOW (ref >60)
Glucose, Bld: 125 mg/dL — ABNORMAL HIGH (ref 70–99)
Potassium: 3.4 mmol/L — ABNORMAL LOW (ref 3.5–5.1)
Sodium: 145 mmol/L (ref 135–145)
Total Bilirubin: 0.9 mg/dL (ref 0.3–1.2)
Total Protein: 6.2 g/dL — ABNORMAL LOW (ref 6.5–8.1)

## 2021-06-29 LAB — CBC
HCT: 35.4 % — ABNORMAL LOW (ref 36.0–46.0)
Hemoglobin: 11.4 g/dL — ABNORMAL LOW (ref 12.0–15.0)
MCH: 30.1 pg (ref 26.0–34.0)
MCHC: 32.2 g/dL (ref 30.0–36.0)
MCV: 93.4 fL (ref 80.0–100.0)
Platelets: 254 10*3/uL (ref 150–400)
RBC: 3.79 MIL/uL — ABNORMAL LOW (ref 3.87–5.11)
RDW: 14.7 % (ref 11.5–15.5)
WBC: 12.3 10*3/uL — ABNORMAL HIGH (ref 4.0–10.5)
nRBC: 0 % (ref 0.0–0.2)

## 2021-06-29 LAB — BLOOD GAS, ARTERIAL
Acid-base deficit: 5.8 mmol/L — ABNORMAL HIGH (ref 0.0–2.0)
Bicarbonate: 19.9 mmol/L — ABNORMAL LOW (ref 20.0–28.0)
Drawn by: 41977
FIO2: 80
O2 Saturation: 90.7 %
Patient temperature: 37
pCO2 arterial: 30.1 mmHg — ABNORMAL LOW (ref 32.0–48.0)
pH, Arterial: 7.397 (ref 7.350–7.450)
pO2, Arterial: 65 mmHg — ABNORMAL LOW (ref 83.0–108.0)

## 2021-06-29 LAB — GLUCOSE, CAPILLARY: Glucose-Capillary: 95 mg/dL (ref 70–99)

## 2021-06-29 LAB — ECHOCARDIOGRAM COMPLETE
AR max vel: 0.75 cm2
AV Area VTI: 0.83 cm2
AV Area mean vel: 0.77 cm2
AV Mean grad: 15 mmHg
AV Peak grad: 29.6 mmHg
Ao pk vel: 2.72 m/s
Area-P 1/2: 7.66 cm2
Calc EF: 35.3 %
Height: 60 in
MV VTI: 2.33 cm2
P 1/2 time: 367 msec
S' Lateral: 2.4 cm
Single Plane A2C EF: 39.3 %
Single Plane A4C EF: 27.7 %
Weight: 1439.16 oz

## 2021-06-29 LAB — TSH: TSH: 1.185 u[IU]/mL (ref 0.350–4.500)

## 2021-06-29 LAB — LACTIC ACID, PLASMA
Lactic Acid, Venous: 1.7 mmol/L (ref 0.5–1.9)
Lactic Acid, Venous: 1.9 mmol/L (ref 0.5–1.9)

## 2021-06-29 LAB — CORTISOL: Cortisol, Plasma: >100 ug/dL

## 2021-06-29 LAB — BRAIN NATRIURETIC PEPTIDE: B Natriuretic Peptide: 2899 pg/mL — ABNORMAL HIGH (ref 0.0–100.0)

## 2021-06-29 LAB — HEPARIN LEVEL (UNFRACTIONATED)
Heparin Unfractionated: 0.62 IU/mL (ref 0.30–0.70)
Heparin Unfractionated: 0.59 IU/mL (ref 0.30–0.70)

## 2021-06-29 MED ORDER — HALOPERIDOL 0.5 MG PO TABS: 0.5000 mg | ORAL_TABLET | ORAL | Status: DC | PRN

## 2021-06-29 MED ORDER — ONDANSETRON HCL 4 MG/2ML IJ SOLN: 4.0000 mg | Freq: Four times a day (QID) | INTRAMUSCULAR | Status: DC | PRN

## 2021-06-29 MED ORDER — ACETAMINOPHEN 650 MG RE SUPP: 650.0000 mg | Freq: Four times a day (QID) | RECTAL | Status: DC | PRN

## 2021-06-29 MED ORDER — HALOPERIDOL LACTATE 2 MG/ML PO CONC: 0.5000 mg | ORAL | Status: DC | PRN

## 2021-06-29 MED ORDER — HALOPERIDOL LACTATE 5 MG/ML IJ SOLN: 0.5000 mg | INTRAMUSCULAR | Status: DC | PRN

## 2021-06-29 MED ORDER — GLYCOPYRROLATE 0.2 MG/ML IJ SOLN: 0.2000 mg | INTRAMUSCULAR | Status: DC | PRN

## 2021-06-29 MED ORDER — BIOTENE DRY MOUTH MT LIQD: 15.0000 mL | OROMUCOSAL | Status: DC | PRN

## 2021-06-29 MED ORDER — ACETAMINOPHEN 325 MG PO TABS: 650.0000 mg | ORAL_TABLET | Freq: Four times a day (QID) | ORAL | Status: DC | PRN

## 2021-06-29 MED ORDER — ONDANSETRON 4 MG PO TBDP: 4.0000 mg | ORAL_TABLET | Freq: Four times a day (QID) | ORAL | Status: DC | PRN

## 2021-06-29 MED ORDER — DEXTROSE-NACL 5-0.9 % IV SOLN: INTRAVENOUS | Status: DC

## 2021-06-29 MED ORDER — POLYVINYL ALCOHOL 1.4 % OP SOLN: 1.0000 [drp] | Freq: Four times a day (QID) | OPHTHALMIC | Status: DC | PRN

## 2021-06-29 MED ORDER — MORPHINE 100MG IN NS 100ML (1MG/ML) PREMIX INFUSION
2.0000 mg/h | INTRAVENOUS | Status: DC
  Administered 2021-06-29: 2 mg/h via INTRAVENOUS
  Filled 2021-06-29: qty 100

## 2021-06-29 MED ORDER — GLYCOPYRROLATE 1 MG PO TABS: 1.0000 mg | ORAL_TABLET | ORAL | Status: DC | PRN

## 2021-06-29 NOTE — Progress Notes (Signed)
ANTICOAGULATION CONSULT NOTE -   Pharmacy Consult for heparin Indication: pulmonary embolus  No Known Allergies  Patient Measurements: Height: 5' (152.4 cm) Weight: 40.8 kg (89 lb 15.2 oz) IBW/kg (Calculated) : 45.5 Heparin Dosing Weight: 40.8 kg  Vital Signs: Temp: 97.7 F (36.5 C) (01/04 0819) Temp Source: Axillary (01/04 0819) BP: 125/73 (01/04 0300)  Labs: Recent Labs    07/26/2021 1224 06/28/21 0344 06/28/21 2309 06/29/21 0449  HGB 10.7* 12.8  --  11.4*  HCT 31.5* 39.3  --  35.4*  PLT 295 334  --  254  HEPARINUNFRC  --   --  0.62 0.59  CREATININE 0.99 1.04*  --  1.34*     Estimated Creatinine Clearance: 18.3 mL/min (A) (by C-G formula based on SCr of 1.34 mg/dL (H)).   Medical History: Past Medical History:  Diagnosis Date   Acoustic neuroma [facility])    Aortic regurgitation    a. Seen on 2D echo in 2013 (moderate) but not mentioned in 11/2013 echo.   Aortic stenosis    a. 2D Echo 11/2013: mild AS.   Carotid stenosis    a. Duplex 1-39% BICA in 11/2013.   Dyspnea    Generalized anxiety disorder    Headache    a. Possible tension HA versus migraine variant.   Hyperlipidemia    Hypertension    a. Labile BP.   Hyponatremia    TIA (transient ischemic attack)     Medications:  Medications Prior to Admission  Medication Sig Dispense Refill Last Dose   acetaminophen (TYLENOL) 325 MG tablet Take 650 mg by mouth every 6 (six) hours as needed for moderate pain.   unk   clopidogrel (PLAVIX) 75 MG tablet Take 1 tablet (75 mg total) by mouth daily. 30 tablet 11 06/26/2021 at 0900   meclizine (ANTIVERT) 12.5 MG tablet Take 1 tablet (12.5 mg total) by mouth 3 (three) times daily as needed for dizziness. 30 tablet 0 Past Week   midodrine (PROAMATINE) 2.5 MG tablet Take 1 tablet (2.5 mg total) by mouth in the morning and at bedtime.   Past Week   olmesartan (BENICAR) 5 MG tablet Take 5 mg by mouth daily as needed (blood pressure over 150).   unk   polyvinyl alcohol  (LIQUIFILM TEARS) 1.4 % ophthalmic solution Place 1 drop into both eyes daily as needed for dry eyes.   Past Week   RABEprazole (ACIPHEX) 20 MG tablet Take 1 tablet (20 mg total) by mouth in the morning and at bedtime. 60 tablet 3 Past Week   ezetimibe (ZETIA) 10 MG tablet Take 1 tablet (10 mg total) by mouth daily. (Patient not taking: Reported on 06/23/2021) 7 tablet 0 Not Taking   senna-docusate (SENOKOT-S) 8.6-50 MG tablet Take 2 tablets by mouth at bedtime. For AFTER surgery, do not take if having diarrhea (Patient not taking: Reported on 06/23/2021) 30 tablet 0 Not Taking    Assessment: Pharmacy consulted to dose heparin in patient with presumed PE.  Patient is not on anticoagulation prior to admission.  HL 0.59- therapeutic  D-Dimer 6.52 CBC WNL  Goal of Therapy:  Heparin level 0.3-0.7 units/ml Monitor platelets by anticoagulation protocol: Yes   Plan:  Continue heparin infusion at 650 units/hr Check anti-Xa level daily Continue to monitor H&H and platelets.   Margot Ables, PharmD Clinical Pharmacist 06/29/2021 8:55 AM

## 2021-06-29 NOTE — Progress Notes (Signed)
°  Transition of Care [facility]) Screening Note   Patient Details  Name: Maine Date of Birth: 11-05-1929   Transition of Care [facility]) CM/SW Contact:    Shade Flood, LCSW Phone Number: 06/29/2021, 1:18 PM    Transition of Care Department [facility]) has reviewed patient and no TOC needs have been identified at this time. We will continue to monitor patient advancement through interdisciplinary progression rounds. If new patient transition needs arise, please place a TOC consult.

## 2021-06-29 NOTE — Progress Notes (Signed)
Patient discussed with primary team. I reviewed her echo earlier today which showed acute drop in her LVEF to 20-25% and severe low flow low gradient aortic stenosis. This is an acute drop in LVEF since 06/17/21 echo.  Patient presents with signs of cardiogenic shock with hypotension, tachycardia, pulmonary edema and is on 15L NRB with significant tachynpea. Patient is DNR and family and palliative are discussing with patient possibly transitioning to comfort care. From cardiac standpoint would be reasonable decision given the severity and instability of her presentation and overall very poor prognosis.   Carlyle Dolly MD

## 2021-06-29 NOTE — Consult Note (Signed)
Consultation Note Date: 06/29/2021   Patient Name: Kathryn Gates  DOB: 25-Sep-1929  MRN: [id number]  Age / Sex: 88 y.o., female  PCP: Lafonda Mosses, MD Referring Physician: Deatra James, MD  Reason for Consultation: Establishing goals of care and Psychosocial/spiritual support  HPI/Patient Profile: 88 y.o. female  with past medical history of mild dementia, acoustic neuroma, TIA, HTN/HLD, aortic regurgitation, carotid stenosis, anxiety, headache, noted to have robotic assisted salpingooophorectomy bilateral for adnexal mass on 05/26/2021 admitted on 07/17/2021 with acute respiratory failure with tachypnea on 100% nonrebreather, toxic metabolic encephalopathy likely due to acute respiratory failure and acute cardiogenic shock.   Clinical Assessment and Goals of Care: I have reviewed medical records including EPIC notes, labs and imaging, received report from RN, assessed the patient.  [name] is lying quietly in bed.  She will open her eyes but not try to communicate with me in any meaningful way.  I do not believe that she can make her basic needs known.  There is no family at bedside at this time.   Detailed discussion with bedside nursing staff who cared for [name] approximately 2 weeks ago.   Call to son/healthcare agent, Suzzette Righter, to discuss diagnosis prognosis, GOC, EOL wishes, disposition and options.  I introduced Palliative Medicine as specialized medical care for people living with serious illness. It focuses on providing relief from the symptoms and stress of a serious illness. The goal is to improve quality of life for both the patient and the family.  Edd Arbour tells me that family friend, Mariann Laster, stayed with patient over new year as Edd Arbour was checking on their [place] property.  He shares that she was very stable, they were hopeful for improved functional status.  We discussed a  brief life review of the patient.  Son and his significant other, Sharyn Lull, stay in camper across the drive from patient and her sister.  [name]'s sister is at home with Hospice care at this time.  Edd Arbour shares that it seems that his mother sister is actively dying today.  He shares a story that his aunt told him that her parents, siblings "had their hands out" and were waiting on her but she told them that she would wait for Sonia Baller.  He shares that he feels like they will likely die close together.  We then focused on their current illness.  We talk in detail about her acute and chronic health concerns, her declines over the last few weeks/days in particular.  We talked about respiratory failure, likelihood of PE.  We also talked about heart strain and multiple organ failure.  The natural disease trajectory and expectations at EOL were discussed.  We talked about comfort care.  Edd Arbour is agreeable to stop medications and treatments that are changing what is happening, instead to focus on comfort and dignity.  He states, "to let nature take its course".  Hospice and Palliative Care services outpatient were explained and offered.  Edd Arbour states that his aunt was on palliative care  prior to transitioning to hospice care.  He states that he prefers his mother not go to hospice.  I shared that due to her respiratory status, she is anticipated to pass away in the hospital.  He states understanding.  Discussed the importance of continued conversation with family and the medical providers regarding overall plan of care and treatment options, ensuring decisions are within the context of the patients values and GOCs.  Questions and concerns were addressed.   The family was encouraged to call with questions or concerns.  PMT will continue to support holistically.  Conference with attending, bedside nursing staff, transition of care team related to patient condition, goals of care, disposition.   HCPOA  NEXT OF  KIN -son, Elaf Clauson.       SUMMARY OF RECOMMENDATIONS   Full comfort care Anticipate in-hospital death, too unstable for transfer   Code Status/Advance Care Planning: DNR  Symptom Management:  End-of-life order set implemented  Palliative Prophylaxis:  Frequent Pain Assessment, Oral Care, and Turn Reposition  Additional Recommendations (Limitations, Scope, Preferences): Full Comfort Care  Psycho-social/Spiritual:  Desire for further Chaplaincy support:no Additional Recommendations: Caregiving  Support/Resources and Education on Hospice  Prognosis:  Hours - Days  Discharge Planning:  In-hospital death expected due to respiratory status.       Primary Diagnoses: Present on Admission:  Intractable nausea and vomiting  CAD (coronary artery disease)  Esophageal reflux  Viral intestinal infection, unspecified  Acute hypoxemic respiratory failure ([place])  Acute respiratory failure ([place])   I have reviewed the medical record, interviewed the patient and family, and examined the patient. The following aspects are pertinent.  Past Medical History:  Diagnosis Date   Acoustic neuroma [facility])    Aortic regurgitation    a. Seen on 2D echo in 2013 (moderate) but not mentioned in 11/2013 echo.   Aortic stenosis    a. 2D Echo 11/2013: mild AS.   Carotid stenosis    a. Duplex 1-39% BICA in 11/2013.   Dyspnea    Generalized anxiety disorder    Headache    a. Possible tension HA versus migraine variant.   Hyperlipidemia    Hypertension    a. Labile BP.   Hyponatremia    TIA (transient ischemic attack)    Social History   Socioeconomic History   Marital status: Widowed    Spouse name: Not on file   Number of children: 1   Years of education: Not on file   Highest education level: Not on file  Occupational History   Not on file  Tobacco Use   Smoking status: Never   Smokeless tobacco: Never   Tobacco comments:    Tobacco use-no  Vaping Use   Vaping Use: Never used   Substance and Sexual Activity   Alcohol use: No    Alcohol/week: 0.0 standard drinks   Drug use: No   Sexual activity: Not on file  Other Topics Concern   Not on file  Social History Narrative   No regular exercise. Her son lives with her.  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    Social Determinants of Health   Financial Resource Strain: Not on file  Food Insecurity: Not on file  Transportation Needs: Not on file  Physical Activity: Not on file  Stress: Not on file  Social Connections: Not on file   Family History  Problem Relation Age of Onset   Stroke Other    Heart failure Other        CHF  Scheduled Meds:  Chlorhexidine Gluconate Cloth  6 each Topical Daily   mouth rinse  15 mL Mouth Rinse BID   metoprolol tartrate  5 mg Intravenous Q6H   Continuous Infusions:  dextrose 5 % and 0.9% NaCl 50 mL/hr at 06/29/21 1102   promethazine (PHENERGAN) injection (IM or IVPB) Stopped (07/21/2021 1509)   PRN Meds:.glycopyrrolate, LORazepam, morphine injection, polyvinyl alcohol, promethazine (PHENERGAN) injection (IM or IVPB) Medications Prior to Admission:  Prior to Admission medications   Medication Sig Start Date End Date Taking? Authorizing Provider  acetaminophen (TYLENOL) 325 MG tablet Take 650 mg by mouth every 6 (six) hours as needed for moderate pain.   Yes [provider]  clopidogrel (PLAVIX) 75 MG tablet Take 1 tablet (75 mg total) by mouth daily. 06/18/21 06/18/22 Yes [name], MD  meclizine (ANTIVERT) 12.5 MG tablet Take 1 tablet (12.5 mg total) by mouth 3 (three) times daily as needed for dizziness. 06/18/21  Yes [name], MD  midodrine (PROAMATINE) 2.5 MG tablet Take 1 tablet (2.5 mg total) by mouth in the morning and at bedtime. 06/24/21  Yes Branch, Alphonse Guild, MD  olmesartan (BENICAR) 5 MG tablet Take 5 mg by mouth daily as needed (blood pressure over 150). 12/30/19  Yes [provider]  polyvinyl alcohol (LIQUIFILM TEARS) 1.4 % ophthalmic solution Place  1 drop into both eyes daily as needed for dry eyes.   Yes [provider]  RABEprazole (ACIPHEX) 20 MG tablet Take 1 tablet (20 mg total) by mouth in the morning and at bedtime. 06/18/21  Yes [name], MD  ezetimibe (ZETIA) 10 MG tablet Take 1 tablet (10 mg total) by mouth daily. Patient not taking: Reported on 06/23/2021 09/26/19   Verta Ellen., NP  senna-docusate (SENOKOT-S) 8.6-50 MG tablet Take 2 tablets by mouth at bedtime. For AFTER surgery, do not take if having diarrhea Patient not taking: Reported on 06/23/2021 05/02/21   Joylene John D, NP   No Known Allergies Review of Systems  Unable to perform ROS: Acuity of condition   Physical Exam Vitals and nursing note reviewed.  Constitutional:      Appearance: She is ill-appearing.  Cardiovascular:     Rate and Rhythm: Normal rate.  Pulmonary:     Comments: Nonrebreather Skin:    General: Skin is warm and dry.    Vital Signs: BP 125/73    Pulse (!) 107    Temp 97.7 F (36.5 C) (Axillary)    Resp (!) 22    Ht 5' (1.524 m)    Wt 40.8 kg    SpO2 100%    BMI 17.57 kg/m  Pain Scale: PAINAD       SpO2: SpO2: 100 % O2 Device:SpO2: 100 % O2 Flow Rate: .O2 Flow Rate (L/min): 15 L/min  IO: Intake/output summary:  Intake/Output Summary (Last 24 hours) at 06/29/2021 1248 Last data filed at 06/29/2021 0431 Gross per 24 hour  Intake 1291.07 ml  Output 1150 ml  Net 141.07 ml    LBM: Last BM Date:  (UTA) Baseline Weight: Weight: 40.8 kg Most recent weight: Weight: 40.8 kg     Palliative Assessment/Data:   Flowsheet Rows    Flowsheet Row Most Recent Value  Intake Tab   Referral Department Hospitalist  Unit at Time of Referral Intermediate Care Unit  Palliative Care Primary Diagnosis Pulmonary  Date Notified 06/29/21  Palliative Care Type New Palliative care  Reason for referral Clarify Goals of Care  Date of Admission 07/10/2021  Date first seen by Palliative Care 06/29/21  # of days Palliative referral  response time 0 Day(s)  # of days IP prior to Palliative referral 2  Clinical Assessment   Palliative Performance Scale Score 20%  Pain Max last 24 hours Not able to report  Pain Min Last 24 hours Not able to report  Dyspnea Max Last 24 Hours Not able to report  Dyspnea Min Last 24 hours Not able to report  Psychosocial & Spiritual Assessment   Palliative Care Outcomes        Time In: 0950 Time Out: 1110 Time Total: 80 minutes  Greater than 50%  of this time was spent counseling and coordinating care related to the above assessment and plan.  Signed by: Drue Novel, NP   Please contact Palliative Medicine Team phone at [phone] for questions and concerns.  For individual provider: See Shea Evans

## 2021-06-29 NOTE — Progress Notes (Signed)
Nutrition Brief Note  RD drawn to patient from [facility] Report.  Patient has a hx of dementia, HTN, HLD. She presented on 1/2 with complaint of vomiting, weakness and diarrhea. Patient on 15 Liters oxygen. Palliative note reviewed. Pt now transitioning to comfort care. Anticipate hospital death. No further nutrition interventions planned at this time.  Please re-consult as needed.   Colman Cater MS,RD,CSG,LDN Contact: Shea Evans

## 2021-06-29 NOTE — Progress Notes (Signed)
*  PRELIMINARY RESULTS* Echocardiogram 2D Echocardiogram has been performed.  Kathryn Gates 06/29/2021, 9:44 AM

## 2021-06-29 NOTE — Progress Notes (Signed)
Kathryn Gates  NAT:[id number] DOB: Jun 28, 1929 DOA: 07/20/2021 PCP: Lafonda Mosses, MD    Subjective: The patient was seen and examined this morning, withdraws only to pain stimuli, does not follow any verbal commands On 15 L of oxygen via nonrebreather will mask -satting 100% Still tachypneic, tachycardic    Brief Narrative:  88 year old with a history of dementia, acoustic neuroma, mod/severe AoS, anxiety, HLD, HTN, BSO 05/26/2021, and TIA who was brought to the ED by her family for ongoing severe diarrhea with marked decreased oral intake as well as nausea and vomiting x24+ hours.  Due to her dementia this patient was not able to provide history but the family did not feel that she was indicating she had abdominal pain.  In the ED CT abdomen revealed no evidence of bowel obstruction and no other acute abdominal findings.  WBC was normal.  There was no evidence of acute kidney disease.  Lipase was normal.  The patient was COVID and flu negative.  UA was not suggestive of urinary tract infection.  During the course of her ED stay she has developed severe tachycardia, tachypnea, and hypoxia.  This raise concern for PE but given her altered mental status empiric anticoagulation was not felt to be safe due to the possibility of intracranial hemorrhage.  CT scans of the head and chest PE protocol are currently pending.  The scans have been delayed as the CT scanner at [facility] was down.  Interim Hx: I was alerted to the patient's unstable state shortly after arriving for my shift.  I reviewed her history and presented to the ED.  I found the patient in the hospital stretcher in the ED room with respiratory rate 35-40 and heart rate 135.  She remarkably appeared to be reasonably comfortable at the time.  When stimulated she becomes more tachypneic and appears agitated.  She does not open her eyes or follow simple commands.  Physical exam reveals coarse upper airway sounds but good air movement  appreciable bilaterally.  Her heart rate is regular but very tachycardic.  Abdomen is tender to touch diffusely but soft and not distended.  There is only trace edema bilateral lower extremities.  Carotid massage was attempted at bedside with heart rate slowing to approximately 128 transiently but not in a sustained fashion.  After seeing the patient I placed orders for Lopressor 10 mg IV x1 and a normal saline 1 L bolus.  I called and spoke with the patient's POA/son.  I explained her grave current condition and my fear that she would not likely survive.  He confirmed and continues to agree with her DNR status but does wish to continue active treatment short of that for now.  The 2 highest elements on my differential at this time are aspiration pneumonitis versus large PE.  I have canceled plans to move her to [facility] for CT imaging as the [facility], CT scanner is now on line again and the patient is also clearly not stable for transportation.  She will be dosed with as needed morphine to aid in her comfort while we continue active medical care.   Assessment & Plan:  Acute hypoxic respiratory failure -tachypnea Requiring 100% nonrebreather to keep sats in the mid 80s -elevated D-dimer concerning for significant PE - aspiration is also a risk given clinical presentation   Toxic metabolic encephalopathy  Likely due to acute respiratory failure, acute cardiogenic shock -Withdraws only to pain stimuli, -In discussion with family and palliative  care team, anticipating withdrawing aggressive care, pursuing comfort care POA, son agreed to DNR/DNI status   sinus tachycardia Large PE is on the differential presently given severe tachycardia and acute hypoxic respiratory failure - stat CT head pending to rule out intracranial bleed -if CT head unrevealing will empirically initiate anticoagulation as I do not wish to give her IV contrast when she appears to be significantly dehydrated and also in  distress (I wish to minimize the time she is off the floor) -should she prove to have a large PE I do not think lytics would be appropriate given her recent surgical procedure  Moderate to severe AOS mean grad 31, AVA VTI 0.90 per TTE October 2022 According to cardiology, compared to recent echocardiogram, much reduced ejection fraction 20-25%, much reduced LV function  Cardiogenic shock -Anticipated cardiogenic shock due to much acute reduce cardiac function, hypotension, tachypnea, tachycardia  ... Due to possibly PE versus acute MI   Nausea vomiting diarrhea No evidence of bowel obstruction on CT  Normocytic anemia Hemoglobin stable  Chronic orthostatic hypotension On midodrine at home, will discontinue  GERD Discontinue PPI  HLD Discontinue Zetia   Disposition:  -Discussed with palliative care, cardiology, and his son -the patient is progressively declining, mentally and physically, noted much reduced cardiac function -patient is minimally responsive, on 15 L nonrebreather w NR mask, on heparin drip for presumed PE -with toxic metabolic encephalopathy  Patient's POA, son [name] is agreed to pursue with comfort care measures, withdraw aggressive care  -Palliative care team following, anticipating in-hospital death    Consultants:  Cardiology /palliative care  Code Status: DNR/DNI  Antimicrobials:  None  DVT prophylaxis: Heparin drip   Objective: Blood pressure 125/73, pulse (!) 107, temperature 97.7 F (36.5 C), temperature source Axillary, resp. rate (!) 22, height 5' (1.524 m), weight 40.8 kg, SpO2 100 %.  Intake/Output Summary (Last 24 hours) at 06/29/2021 1234 Last data filed at 06/29/2021 0431 Gross per 24 hour  Intake 1291.07 ml  Output 1150 ml  Net 141.07 ml   Filed Weights   07/10/2021 1206 06/29/21 0430  Weight: 40.8 kg 40.8 kg      Physical Exam:   General:  Unresponsive, withdraws to pain stimuli  HEENT:  Limited exam -encephalopathic  nonrebreather mask on face  Neuro:  Limited exam patient is minimally responsive  Lungs:   On nonrebreather will mask-lower lobe Rales shallow breathing,   no wheezes / crackles  Cardio:    S1/S2, tachycardic, faint systolic murmure,   Abdomen:   Soft, non-tender, bowel sounds active all four quadrants,  no guarding or peritoneal signs.  Muscular skeletal:  Limited exam - in bed, minimal withdrawal with pain stimuli 2+ pulses,  symmetric, No pitting edema  Skin:  Dry, warm to touch, negative for any Rashes,  Wounds: Please see nursing documentation       CBC: Recent Labs  Lab 06/23/21 1145 07/15/2021 1224 06/28/21 0344 06/29/21 0449  WBC 5.5 7.1 13.7* 12.3*  NEUTROABS 4.2  --   --   --   HGB 10.3* 10.7* 12.8 11.4*  HCT 30.3* 31.5* 39.3 35.4*  MCV 89.6 90.3 93.1 93.4  PLT 318 295 334 665   Basic Metabolic Panel: Recent Labs  Lab 07/06/2021 1224 06/28/21 0344 06/29/21 0449  NA 138 140 145  K 3.5 3.4* 3.4*  CL 101 104 112*  CO2 21* 19* 22  GLUCOSE 152* 191* 125*  BUN 14 14 24*  CREATININE 0.99 1.04* 1.34*  CALCIUM 9.0 9.0 8.1*   GFR: Estimated Creatinine Clearance: 18.3 mL/min (A) (by C-G formula based on SCr of 1.34 mg/dL (H)).  Liver Function Tests: Recent Labs  Lab 06/23/21 1145 07/22/2021 1224 06/29/21 0449  AST 15 32 54*  ALT 9 18 56*  ALKPHOS 61 72 92  BILITOT 0.5 1.2 0.9  PROT 6.4* 7.6 6.2*  ALBUMIN 3.9 4.6 3.3*   Recent Labs  Lab 07/04/2021 1224  LIPASE 45   HbA1C: Hgb A1c MFr Bld  Date/Time Value Ref Range Status  06/18/2021 04:57 AM 5.0 4.8 - 5.6 % Final    Comment:    (NOTE) Pre diabetes:          5.7%-6.4%  Diabetes:              >6.4%  Glycemic control for   <7.0% adults with diabetes   12/12/2012 08:40 AM 5.4 <5.7 % Final    Comment:    (NOTE)                                                                       According to the ADA Clinical Practice Recommendations for 2011, when HbA1c is used as a screening test:  >=6.5%    Diagnostic of Diabetes Mellitus           (if abnormal result is confirmed) 5.7-6.4%   Increased risk of developing Diabetes Mellitus References:Diagnosis and Classification of Diabetes Mellitus,Diabetes KGMW,1027,25(DGUYQ 1):S62-S69 and Standards of Medical Care in         Diabetes - 2011,Diabetes Care,2011,34 (Suppl 1):S11-S61.    Recent Results (from the past 240 hour(s))  Resp Panel by RT-PCR (Flu A&B, Covid) Nasopharyngeal Swab     Status: None   Collection Time: 06/26/2021  3:14 PM   Specimen: Nasopharyngeal Swab; Nasopharyngeal(NP) swabs in vial transport medium  Result Value Ref Range Status   SARS Coronavirus 2 by RT PCR NEGATIVE NEGATIVE Final    Comment: (NOTE) SARS-CoV-2 target nucleic acids are NOT DETECTED.  The SARS-CoV-2 RNA is generally detectable in upper respiratory specimens during the acute phase of infection. The lowest concentration of SARS-CoV-2 viral copies this assay can detect is 138 copies/mL. A negative result does not preclude SARS-Cov-2 infection and should not be used as the sole basis for treatment or other patient management decisions. A negative result may occur with  improper specimen collection/handling, submission of specimen other than nasopharyngeal swab, presence of viral mutation(s) within the areas targeted by this assay, and inadequate number of viral copies(<138 copies/mL). A negative result must be combined with clinical observations, patient history, and epidemiological information. The expected result is Negative.  Fact Sheet for Patients:  EntrepreneurPulse.com.au  Fact Sheet for Healthcare Providers:  IncredibleEmployment.be  This test is no t yet approved or cleared by the [place] FDA and  has been authorized for detection and/or diagnosis of SARS-CoV-2 by FDA under an Emergency Use Authorization (EUA). This EUA will remain  in effect (meaning this test can be used) for the duration of  the COVID-19 declaration under Section 564(b)(1) of the Act, 21 U.S.C.section 360bbb-3(b)(1), unless the authorization is terminated  or revoked sooner.       Influenza A by PCR NEGATIVE NEGATIVE Final   Influenza B  by PCR NEGATIVE NEGATIVE Final    Comment: (NOTE) The Xpert Xpress SARS-CoV-2/FLU/RSV plus assay is intended as an aid in the diagnosis of influenza from Nasopharyngeal swab specimens and should not be used as a sole basis for treatment. Nasal washings and aspirates are unacceptable for Xpert Xpress SARS-CoV-2/FLU/RSV testing.  Fact Sheet for Patients: EntrepreneurPulse.com.au  Fact Sheet for Healthcare Providers: IncredibleEmployment.be  This test is not yet approved or cleared by the [place] FDA and has been authorized for detection and/or diagnosis of SARS-CoV-2 by FDA under an Emergency Use Authorization (EUA). This EUA will remain in effect (meaning this test can be used) for the duration of the COVID-19 declaration under Section 564(b)(1) of the Act, 21 U.S.C. section 360bbb-3(b)(1), unless the authorization is terminated or revoked.  Performed at [facility], [address] [place], [place] [address]   MRSA Next Gen by PCR, Nasal     Status: None   Collection Time: 06/28/21  4:18 PM   Specimen: Nasal Mucosa; Nasal Swab  Result Value Ref Range Status   MRSA by PCR Next Gen NOT DETECTED NOT DETECTED Final    Comment: (NOTE) The GeneXpert MRSA Assay (FDA approved for NASAL specimens only), is one component of a comprehensive MRSA colonization surveillance program. It is not intended to diagnose MRSA infection nor to guide or monitor treatment for MRSA infections. Test performance is not FDA approved in patients less than 42 years old. Performed at [facility], [address] [place], [place] [address]      Scheduled Meds:  Chlorhexidine Gluconate Cloth  6 each Topical Daily   mouth rinse  15 mL Mouth Rinse BID    metoprolol tartrate  5 mg Intravenous Q6H   Continuous Infusions:  dextrose 5 % and 0.9% NaCl 50 mL/hr at 06/29/21 1102   promethazine (PHENERGAN) injection (IM or IVPB) Stopped (07/05/2021 1509)     LOS: 2 days    SIGNED: Deatra James, MD, FHM. [facility],  Pager (please use [email or web address] to page/text)  Please use Epic Secure Chat for non-urgent communication (7AM-7PM) If 7PM-7AM, please contact night-coverage [email or web address],  06/29/2021, 12:34 PM   If 7PM-7AM, please contact night-coverage per Amion 06/29/2021, 12:34 PM

## 2021-06-29 NOTE — Progress Notes (Signed)
ANTICOAGULATION CONSULT NOTE   Pharmacy Consult for heparin Indication: pulmonary embolus  No Known Allergies  Patient Measurements: Height: 5' (152.4 cm) Weight: 40.8 kg (89 lb 15.2 oz) IBW/kg (Calculated) : 45.5 Heparin Dosing Weight: 40.8 kg  Vital Signs: Temp: 96.1 F (35.6 C) (01/03 2324) Temp Source: Axillary (01/03 2324) BP: 122/78 (01/04 0100) Pulse Rate: 107 (01/03 1433)  Labs: Recent Labs    07/11/2021 1224 06/28/21 0344 06/28/21 2309  HGB 10.7* 12.8  --   HCT 31.5* 39.3  --   PLT 295 334  --   HEPARINUNFRC  --   --  0.62  CREATININE 0.99 1.04*  --      Estimated Creatinine Clearance: 23.6 mL/min (A) (by C-G formula based on SCr of 1.04 mg/dL (H)).   Medical History: Past Medical History:  Diagnosis Date   Acoustic neuroma [facility])    Aortic regurgitation    a. Seen on 2D echo in 2013 (moderate) but not mentioned in 11/2013 echo.   Aortic stenosis    a. 2D Echo 11/2013: mild AS.   Carotid stenosis    a. Duplex 1-39% BICA in 11/2013.   Dyspnea    Generalized anxiety disorder    Headache    a. Possible tension HA versus migraine variant.   Hyperlipidemia    Hypertension    a. Labile BP.   Hyponatremia    TIA (transient ischemic attack)     Medications:  Medications Prior to Admission  Medication Sig Dispense Refill Last Dose   acetaminophen (TYLENOL) 325 MG tablet Take 650 mg by mouth every 6 (six) hours as needed for moderate pain.   unk   clopidogrel (PLAVIX) 75 MG tablet Take 1 tablet (75 mg total) by mouth daily. 30 tablet 11 06/26/2021 at 0900   meclizine (ANTIVERT) 12.5 MG tablet Take 1 tablet (12.5 mg total) by mouth 3 (three) times daily as needed for dizziness. 30 tablet 0 Past Week   midodrine (PROAMATINE) 2.5 MG tablet Take 1 tablet (2.5 mg total) by mouth in the morning and at bedtime.   Past Week   olmesartan (BENICAR) 5 MG tablet Take 5 mg by mouth daily as needed (blood pressure over 150).   unk   polyvinyl alcohol (LIQUIFILM TEARS) 1.4 %  ophthalmic solution Place 1 drop into both eyes daily as needed for dry eyes.   Past Week   RABEprazole (ACIPHEX) 20 MG tablet Take 1 tablet (20 mg total) by mouth in the morning and at bedtime. 60 tablet 3 Past Week   ezetimibe (ZETIA) 10 MG tablet Take 1 tablet (10 mg total) by mouth daily. (Patient not taking: Reported on 06/23/2021) 7 tablet 0 Not Taking   senna-docusate (SENOKOT-S) 8.6-50 MG tablet Take 2 tablets by mouth at bedtime. For AFTER surgery, do not take if having diarrhea (Patient not taking: Reported on 06/23/2021) 30 tablet 0 Not Taking    Assessment: Pharmacy consulted to dose heparin in patient with presumed PE.  Patient is not on anticoagulation prior to admission.  D-Dimer 6.52 CBC WNL  1/4 AM update:  Heparin level therapeutic   Goal of Therapy:  Heparin level 0.3-0.7 units/ml Monitor platelets by anticoagulation protocol: Yes   Plan:  Cont heparin 650 units/hr Confirmatory heparin level with AM labs F/U pulmonary embolus work-up  Narda Bonds, PharmD, BCPS Clinical Pharmacist Phone: [phone]

## 2021-06-30 DIAGNOSIS — R112 Nausea with vomiting, unspecified: Secondary | ICD-10-CM | POA: Diagnosis not present

## 2021-07-01 ENCOUNTER — Encounter: Payer: Medicare Other | Admitting: Gynecologic Oncology

## 2021-07-05 ENCOUNTER — Other Ambulatory Visit (HOSPITAL_COMMUNITY): Payer: Medicare Other

## 2021-07-27 NOTE — Progress Notes (Signed)
Pt time of death 1032, 2 nurses verified. This nurse as well as Emiliano Dyer, RN pronounced. MD aware. Pt next of kin aware. Will complete post mortem checklist.

## 2021-07-27 NOTE — Discharge Summary (Signed)
Death Summary  Kathryn Gates DJS:[id number] DOB: 1930/02/06 DOA: 07/06/21  PCP: Lafonda Mosses, MD  Admit date: 07-06-21 Date of Death: 2021/07/09 Time of Death:10:32 Notification: Lafonda Mosses, MD notified of death of 07/09/21   Final/Principal Diagnoses:  1.  Acute respiratory failure (presumed PE) -hypoxia 2.  Toxic metabolic encephalopathy 3.  Cardiogenic shock 4.  Hypotension 5.  Moderate-severe aortic stenosis   Brief Narrative:  88 year old with a history of dementia, acoustic neuroma, mod/severe AoS, anxiety, HLD, HTN, BSO 05/26/2021, and TIA who was brought to the ED by her family for ongoing severe diarrhea with marked decreased oral intake as well as nausea and vomiting x24+ hours.  Due to her dementia this patient was not able to provide history but the family did not feel that she was indicating she had abdominal pain.   In the ED CT abdomen revealed no evidence of bowel obstruction and no other acute abdominal findings.  WBC was normal.  There was no evidence of acute kidney disease.  Lipase was normal.  The patient was COVID and flu negative.  UA was not suggestive of urinary tract infection.  During the course of her ED stay she has developed severe tachycardia, tachypnea, and hypoxia.  This raise concern for PE but given her altered mental status empiric anticoagulation was not felt to be safe due to the possibility of intracranial hemorrhage.  CT scans of the head and chest PE protocol are currently pending.  The scans have been delayed as the CT scanner at [facility] was down.   Interim Hx: I was alerted to the patient's unstable state shortly after arriving for my shift.  I reviewed her history and presented to the ED.  I found the patient in the hospital stretcher in the ED room with respiratory rate 35-40 and heart rate 135.  She remarkably appeared to be reasonably comfortable at the time.  When stimulated she becomes more tachypneic and appears agitated.   She does not open her eyes or follow simple commands.  Physical exam reveals coarse upper airway sounds but good air movement appreciable bilaterally.  Her heart rate is regular but very tachycardic.  Abdomen is tender to touch diffusely but soft and not distended.  There is only trace edema bilateral lower extremities.  Carotid massage was attempted at bedside with heart rate slowing to approximately 128 transiently but not in a sustained fashion.   After seeing the patient I placed orders for Lopressor 10 mg IV x1 and a normal saline 1 L bolus.  I called and spoke with the patient's POA/son.  I explained her grave current condition and my fear that she would not likely survive.  He confirmed and continues to agree with her DNR status but does wish to continue active treatment short of that for now.  The 2 highest elements on my differential at this time are aspiration pneumonitis versus large PE.  I have canceled plans to move her to [facility] for CT imaging as the [facility], CT scanner is now on line again and the patient is also clearly not stable for transportation.  She will be dosed with as needed morphine to aid in her comfort while we continue active medical care.     Assessment & Plan:   Acute hypoxic respiratory failure -tachypnea Requiring 100% nonrebreather to keep sats in the mid 80s -elevated D-dimer concerning for significant PE - aspiration is also a risk given clinical presentation    Toxic metabolic  encephalopathy  Likely due to acute respiratory failure, acute cardiogenic shock -Withdraws only to pain stimuli, -In discussion with family and palliative care team, anticipating withdrawing aggressive care, pursuing comfort care POA, son agreed to DNR/DNI status     sinus tachycardia Large PE is on the differential presently given severe tachycardia and acute hypoxic respiratory failure - stat CT head pending to rule out intracranial bleed -if CT head unrevealing will empirically  initiate anticoagulation as I do not wish to give her IV contrast when she appears to be significantly dehydrated and also in distress (I wish to minimize the time she is off the floor) -should she prove to have a large PE I do not think lytics would be appropriate given her recent surgical procedure   Moderate to severe AOS mean grad 31, AVA VTI 0.90 per TTE October 2022 According to cardiology, compared to recent echocardiogram, much reduced ejection fraction 20-25%, much reduced LV function   Cardiogenic shock -Anticipated cardiogenic shock due to much acute reduce cardiac function, hypotension, tachypnea, tachycardia  ... Due to possibly PE versus acute MI     Nausea vomiting diarrhea No evidence of bowel obstruction on CT   Normocytic anemia Hemoglobin stable   Chronic orthostatic hypotension On midodrine at home, will discontinue   GERD Discontinue PPI   HLD Discontinue Zetia     Disposition:  -Discussed with palliative care, cardiology, and his son -the patient is progressively declining, mentally and physically, noted much reduced cardiac function -patient is minimally responsive, on 15 L nonrebreather w NR mask, on heparin drip for presumed PE -with toxic metabolic encephalopathy  Patient's POA, son Mr. Gross is agreed to pursue with comfort care measures, withdraw aggressive care  -Palliative care team following, anticipating in-hospital death       Consultants:  Cardiology /palliative care   Code Status: DNR/DNI      The results of significant diagnostics from this hospitalization (including imaging, microbiology, ancillary and laboratory) are listed below for reference.    Significant Diagnostic Studies:   Microbiology: Recent Results (from the past 240 hour(s))  Resp Panel by RT-PCR (Flu A&B, Covid) Nasopharyngeal Swab     Status: None   Collection Time: 07/17/2021  3:14 PM   Specimen: Nasopharyngeal Swab; Nasopharyngeal(NP) swabs in vial transport medium   Result Value Ref Range Status   SARS Coronavirus 2 by RT PCR NEGATIVE NEGATIVE Final    Comment: (NOTE) SARS-CoV-2 target nucleic acids are NOT DETECTED.  The SARS-CoV-2 RNA is generally detectable in upper respiratory specimens during the acute phase of infection. The lowest concentration of SARS-CoV-2 viral copies this assay can detect is 138 copies/mL. A negative result does not preclude SARS-Cov-2 infection and should not be used as the sole basis for treatment or other patient management decisions. A negative result may occur with  improper specimen collection/handling, submission of specimen other than nasopharyngeal swab, presence of viral mutation(s) within the areas targeted by this assay, and inadequate number of viral copies(<138 copies/mL). A negative result must be combined with clinical observations, patient history, and epidemiological information. The expected result is Negative.  Fact Sheet for Patients:  EntrepreneurPulse.com.au  Fact Sheet for Healthcare Providers:  IncredibleEmployment.be  This test is no t yet approved or cleared by the [place] FDA and  has been authorized for detection and/or diagnosis of SARS-CoV-2 by FDA under an Emergency Use Authorization (EUA). This EUA will remain  in effect (meaning this test can be used) for the duration of the COVID-19  declaration under Section 564(b)(1) of the Act, 21 U.S.C.section 360bbb-3(b)(1), unless the authorization is terminated  or revoked sooner.       Influenza A by PCR NEGATIVE NEGATIVE Final   Influenza B by PCR NEGATIVE NEGATIVE Final    Comment: (NOTE) The Xpert Xpress SARS-CoV-2/FLU/RSV plus assay is intended as an aid in the diagnosis of influenza from Nasopharyngeal swab specimens and should not be used as a sole basis for treatment. Nasal washings and aspirates are unacceptable for Xpert Xpress SARS-CoV-2/FLU/RSV testing.  Fact Sheet for  Patients: EntrepreneurPulse.com.au  Fact Sheet for Healthcare Providers: IncredibleEmployment.be  This test is not yet approved or cleared by the [place] FDA and has been authorized for detection and/or diagnosis of SARS-CoV-2 by FDA under an Emergency Use Authorization (EUA). This EUA will remain in effect (meaning this test can be used) for the duration of the COVID-19 declaration under Section 564(b)(1) of the Act, 21 U.S.C. section 360bbb-3(b)(1), unless the authorization is terminated or revoked.  Performed at [facility], [address], [place], [place] [address]   MRSA Next Gen by PCR, Nasal     Status: None   Collection Time: 06/28/21  4:18 PM   Specimen: Nasal Mucosa; Nasal Swab  Result Value Ref Range Status   MRSA by PCR Next Gen NOT DETECTED NOT DETECTED Final    Comment: (NOTE) The GeneXpert MRSA Assay (FDA approved for NASAL specimens only), is one component of a comprehensive MRSA colonization surveillance program. It is not intended to diagnose MRSA infection nor to guide or monitor treatment for MRSA infections. Test performance is not FDA approved in patients less than 70 years old. Performed at [facility], [address] [place], [place] [address]      Labs: Basic Metabolic Panel: Recent Labs  Lab 06/23/21 1145 06/28/2021 1224 06/28/21 0344 06/29/21 0449  NA 130* 138 140 145  K 3.5 3.5 3.4* 3.4*  CL 95* 101 104 112*  CO2 26 21* 19* 22  GLUCOSE 136* 152* 191* 125*  BUN 17 14 14  24*  CREATININE 1.23* 0.99 1.04* 1.34*  CALCIUM 8.9 9.0 9.0 8.1*   Liver Function Tests: Recent Labs  Lab 06/23/21 1145 07/10/2021 1224 06/29/21 0449  AST 15 32 54*  ALT 9 18 56*  ALKPHOS 61 72 92  BILITOT 0.5 1.2 0.9  PROT 6.4* 7.6 6.2*  ALBUMIN 3.9 4.6 3.3*   Recent Labs  Lab 07/06/2021 1224  LIPASE 45   No results for input(s): AMMONIA in the last 168 hours. CBC: Recent Labs  Lab 06/23/21 1145 07/25/2021 1224  06/28/21 0344 06/29/21 0449  WBC 5.5 7.1 13.7* 12.3*  NEUTROABS 4.2  --   --   --   HGB 10.3* 10.7* 12.8 11.4*  HCT 30.3* 31.5* 39.3 35.4*  MCV 89.6 90.3 93.1 93.4  PLT 318 295 334 254   Cardiac Enzymes: No results for input(s): CKTOTAL, CKMB, CKMBINDEX, TROPONINI in the last 168 hours. D-Dimer Recent Labs    06/28/21 0436  DDIMER 6.52*   BNP: Invalid input(s): POCBNP CBG: Recent Labs  Lab 06/29/21 1146  GLUCAP 95   Anemia work up No results for input(s): VITAMINB12, FOLATE, FERRITIN, TIBC, IRON, RETICCTPCT in the last 72 hours. Urinalysis    Component Value Date/Time   COLORURINE YELLOW 07/05/2021 1757   APPEARANCEUR HAZY (A) 07/04/2021 1757   LABSPEC 1.031 (H) 07/26/2021 1757   PHURINE 5.0 07/12/2021 1757   GLUCOSEU NEGATIVE 07/02/2021 1757   HGBUR MODERATE (A) 07/07/2021 1757  BILIRUBINUR NEGATIVE 07/24/2021 1757   KETONESUR 20 (A) 07/02/2021 1757   PROTEINUR 30 (A) 07/23/2021 1757   UROBILINOGEN 0.2 12/11/2012 2243   NITRITE NEGATIVE 07/16/2021 1757   LEUKOCYTESUR NEGATIVE 07/11/2021 1757   Sepsis Labs Invalid input(s): PROCALCITONIN,  WBC,  LACTICIDVEN  I have spent 35 minutes face to face encounter with the patient and on the ward discussing the patients care, assessment, plan and disposition with other care givers. >50% of the time was devoted  coordinating care.    SIGNED:  Deatra James, MD  [facility] 07/16/21, 10:59 AM Pager   If 7PM-7AM, please contact night-coverage [email or web address] Password [email or web address]

## 2021-07-27 DEATH — deceased

## 2023-05-13 IMAGING — CT CT HEAD W/O CM
3 series · 15 of 47 positions shown, 18 images · non-contrast
Comparison: CT head 06/23/2021, MR head 06/17/2021

CLINICAL DATA: Vomiting and weakness

EXAM:
CT HEAD WITHOUT CONTRAST
TECHNIQUE: Contiguous axial images were obtained from the base of the skull
through the vertex without intravenous contrast.

[Series 2: head w o · axial · 0.39mm/px · z∈[-29,+96]mm · 9 of 30 slices shown, 12 images]
[im 3/30  brain]
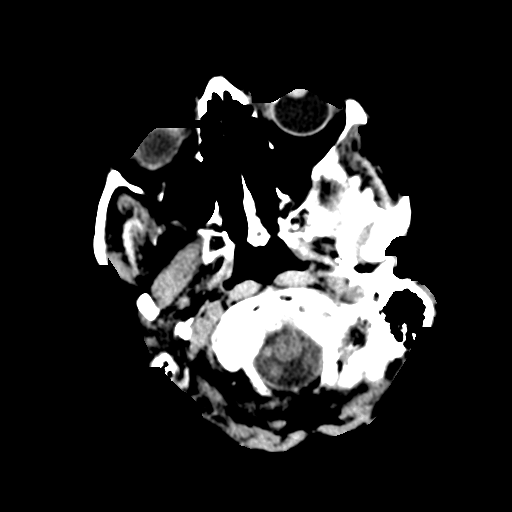
[im 3/30  bone]
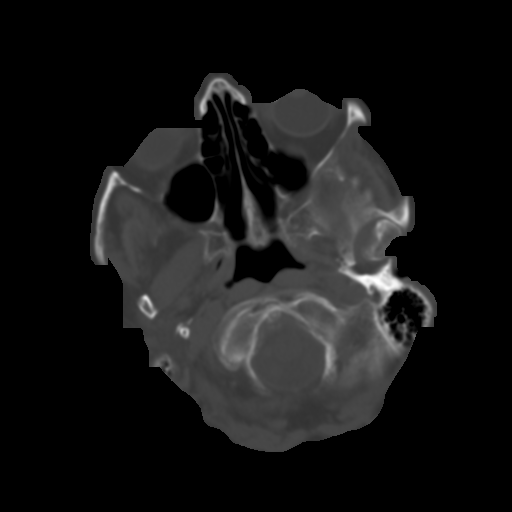
[im 6/30  brain]
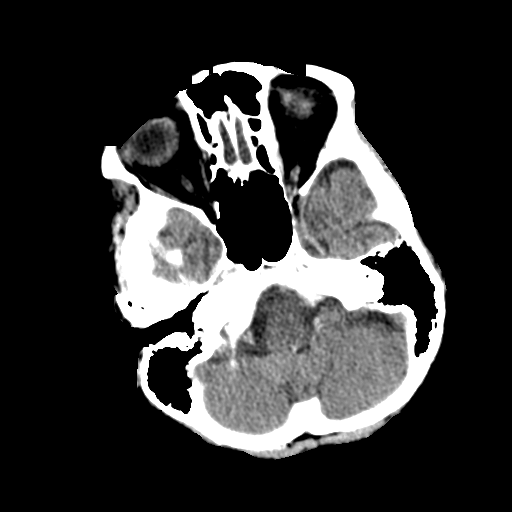
[im 9/30  brain]
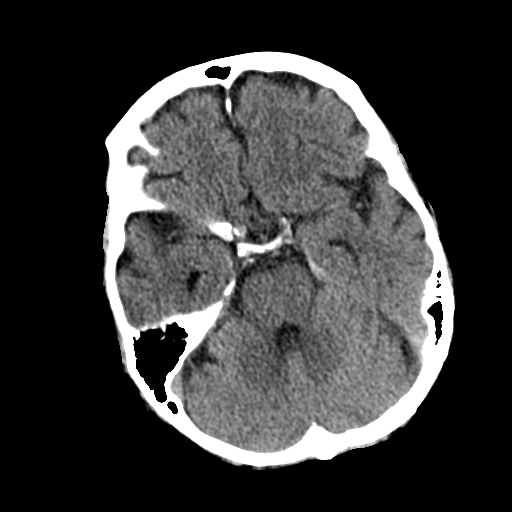
[im 12/30  brain]
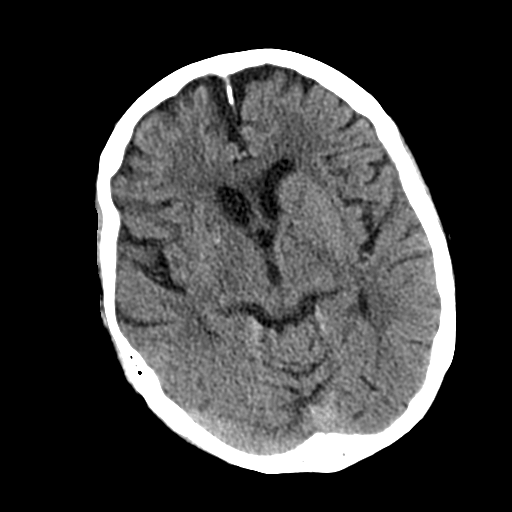
[im 16/30  brain]
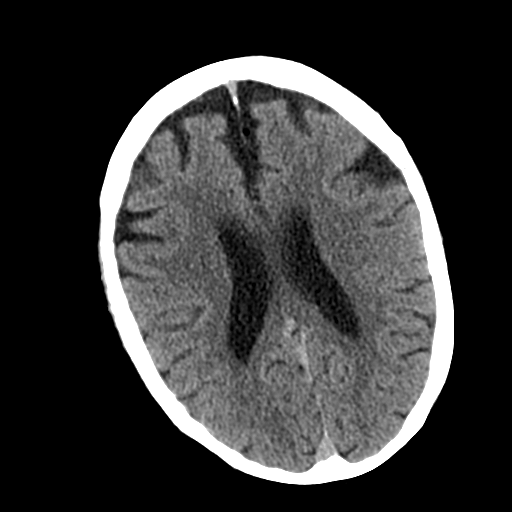
[im 16/30  bone]
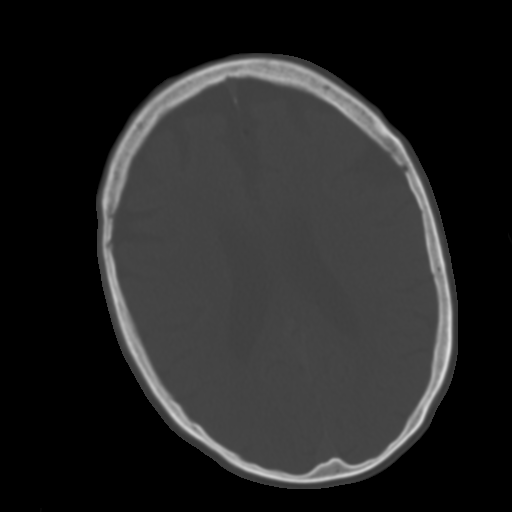
[im 19/30  brain]
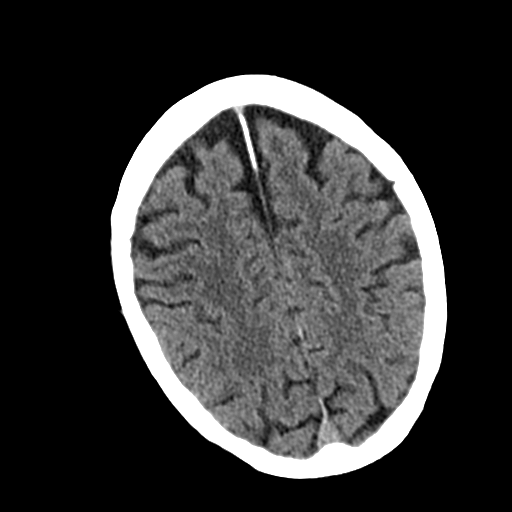
[im 22/30  brain]
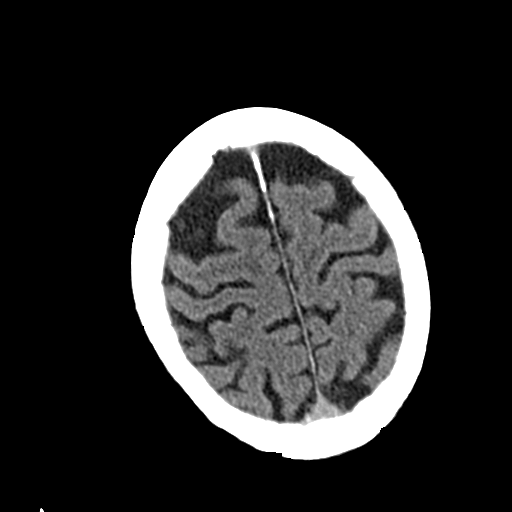
[im 25/30  brain]
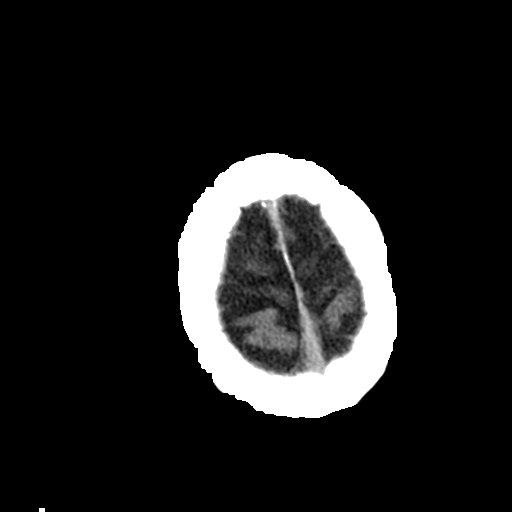
[im 28/30  brain]
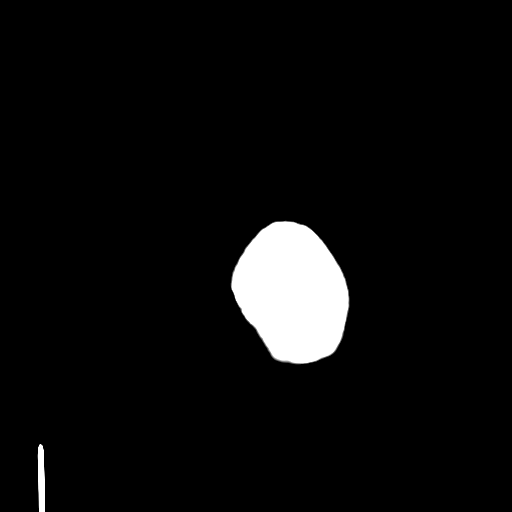
[im 28/30  bone]
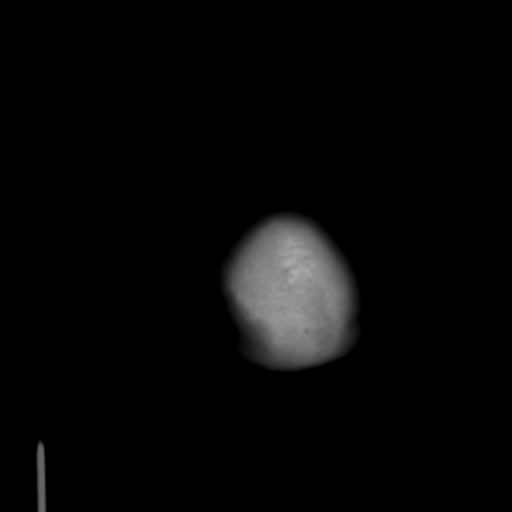

[Series 4: coronal soft · coronal · 0.34mm/px · 3 of 67 slices shown]
[im 23/67  brain]
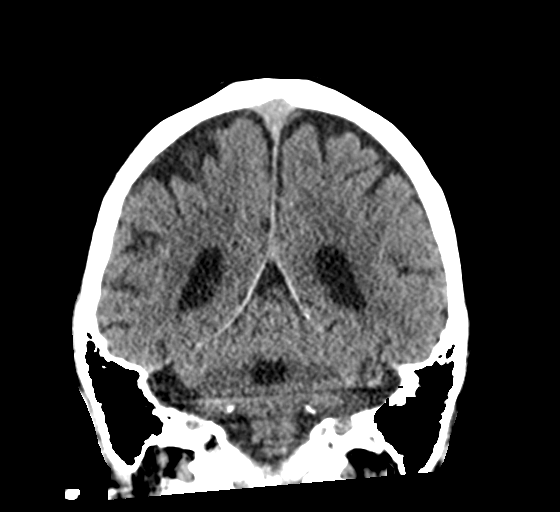
[im 30/67  brain]
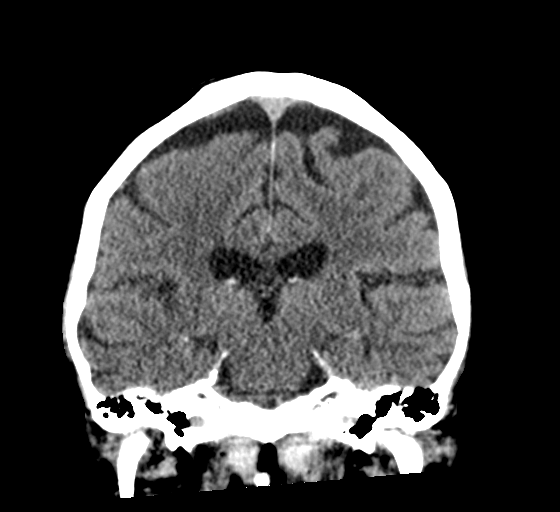
[im 37/67  brain]
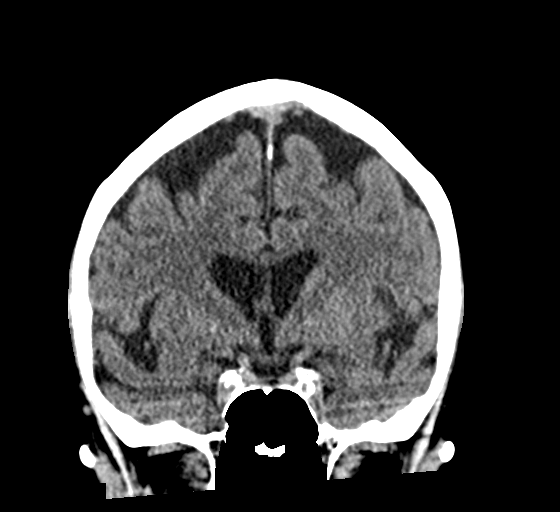

[Series 5: sagittal soft · sagittal · 0.29mm/px · 3 of 62 slices shown]
[im 21/62  brain]
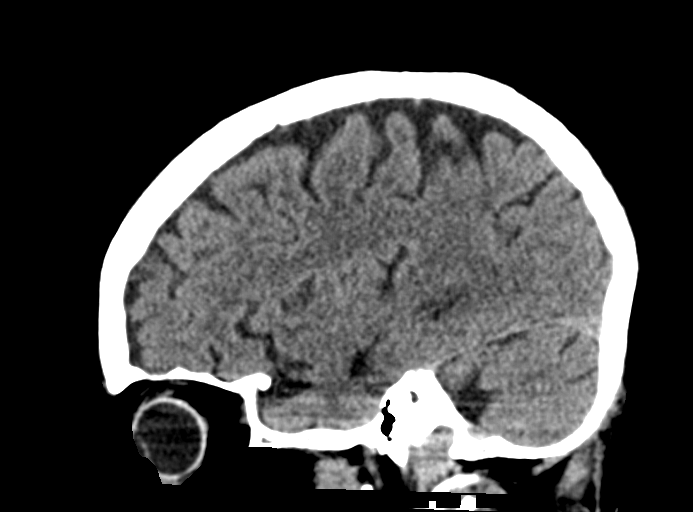
[im 31/62  brain]
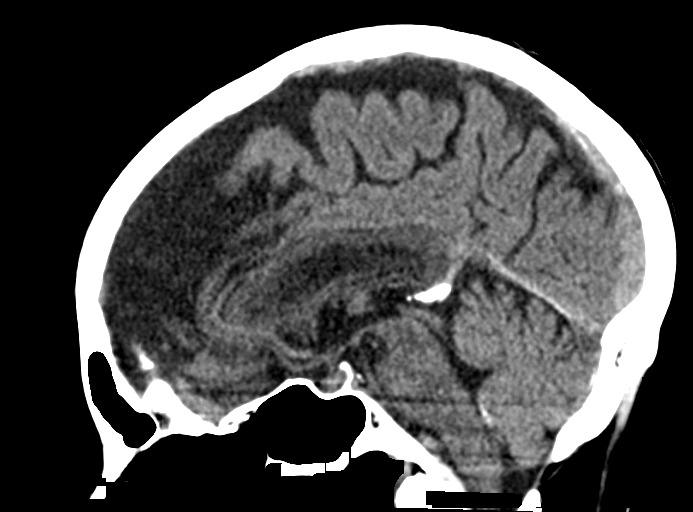
[im 41/62  brain]
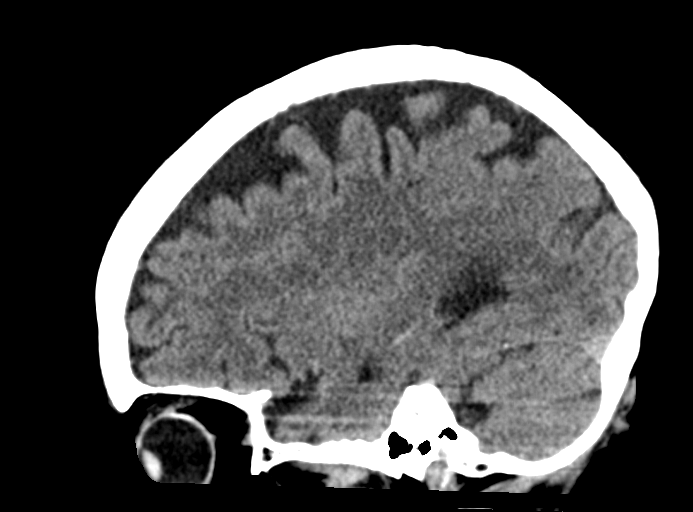

[15 of 47 positions shown; findings below may reference images not displayed]

FINDINGS: Brain: There is no evidence of acute intracranial hemorrhage,
extra-axial fluid collection, or acute infarct.

Parenchymal volume is normal. The ventricles are normal in size.
There is no mass lesion. There is no midline shift.

Vascular: There is calcification of the bilateral cavernous ICAs.

Skull: Normal. Negative for fracture or focal lesion.

Sinuses/Orbits: The imaged paranasal sinuses are clear. A right lens
implant is noted. The globes and orbits are otherwise unremarkable.

Other: None.
IMPRESSION: No acute intracranial pathology.

## 2023-05-14 IMAGING — DX DG CHEST 1V PORT
1 series · 1 of 1 positions shown · non-contrast
Comparison: Chest x-ray 06/28/2021.

CLINICAL DATA: Severe diarrhea.  Decreased oral intake.

EXAM:
PORTABLE CHEST 1 VIEW

[chest ap]
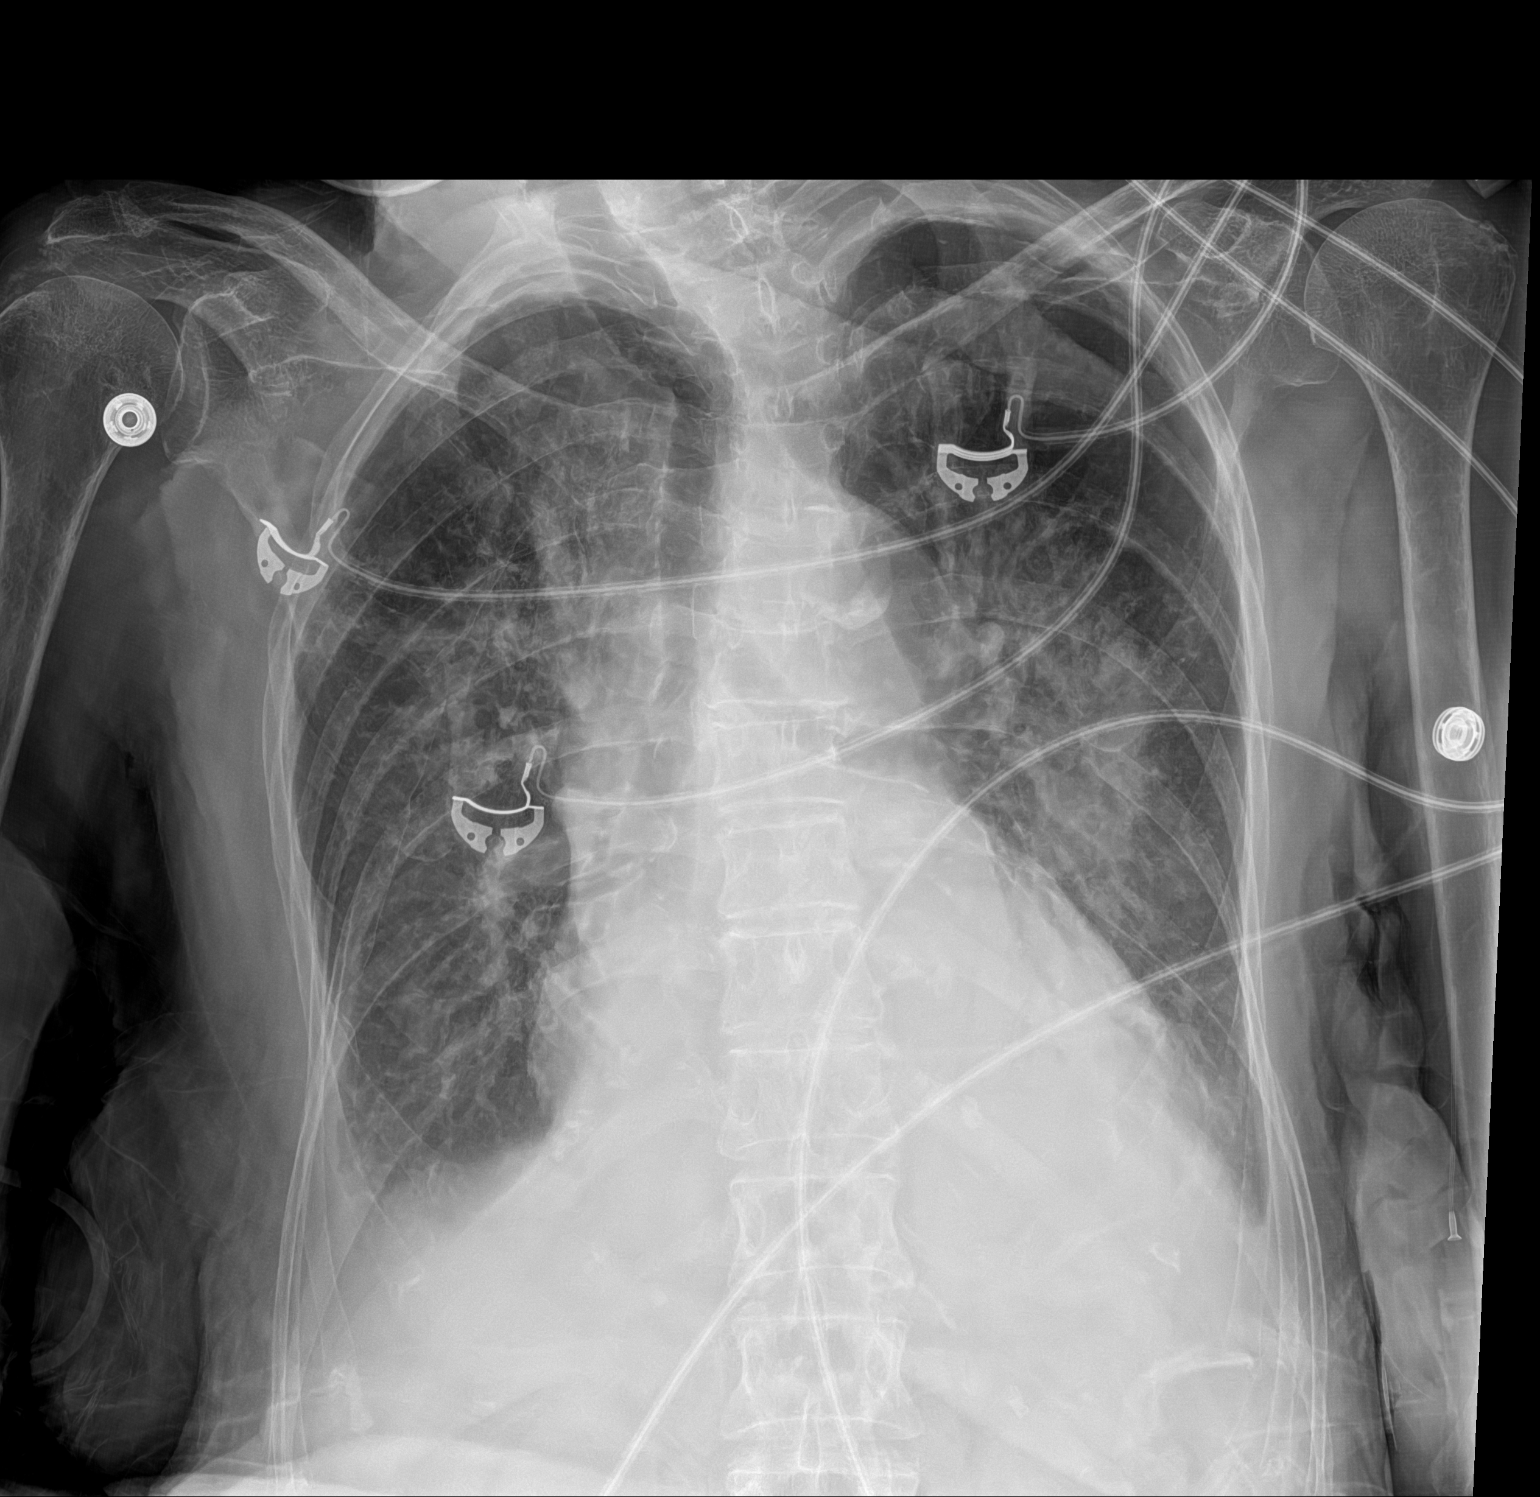

[1 of 1 positions shown; findings below may reference images not displayed]

FINDINGS: Mediastinum and hilar structures normal. Cardiomegaly. Bilateral
pulmonary infiltrates/edema again noted. Similar findings noted on
prior exam. Bibasilar atelectasis and or infiltrates are again noted
with similar appearance. Small bilateral pleural effusions also
again noted and appear unchanged. No pneumothorax. Degenerative
changes scoliosis thoracic spine.
IMPRESSION: 1.  Cardiomegaly.

2. Bilateral pulmonary infiltrates/edema again noted. Bibasilar
atelectasis and or infiltrates again noted. Small bilateral pleural
effusions again noted. Findings appear similar to prior exam.
